# Patient Record
Sex: Female | Born: 1995 | Race: White | Hispanic: No | Marital: Single | Smoking: Never smoker
Health system: Southern US, Community
[De-identification: ages and names within clinical notes are randomized; demographics above are authoritative.]

## PROBLEM LIST (undated history)

## (undated) ENCOUNTER — Emergency Department (HOSPITAL_BASED_OUTPATIENT_CLINIC_OR_DEPARTMENT_OTHER): Admission: EM | Payer: BC Managed Care – PPO | Source: Home / Self Care

## (undated) DIAGNOSIS — L409 Psoriasis, unspecified: Secondary | ICD-10-CM

## (undated) DIAGNOSIS — T7840XA Allergy, unspecified, initial encounter: Secondary | ICD-10-CM

## (undated) DIAGNOSIS — R519 Headache, unspecified: Secondary | ICD-10-CM

## (undated) DIAGNOSIS — G43909 Migraine, unspecified, not intractable, without status migrainosus: Secondary | ICD-10-CM

## (undated) DIAGNOSIS — L4050 Arthropathic psoriasis, unspecified: Secondary | ICD-10-CM

## (undated) HISTORY — PX: TONSILLECTOMY AND ADENOIDECTOMY: SHX28

## (undated) HISTORY — DX: Arthropathic psoriasis, unspecified: L40.50

## (undated) HISTORY — DX: Allergy, unspecified, initial encounter: T78.40XA

## (undated) HISTORY — PX: OTHER SURGICAL HISTORY: SHX169

## (undated) HISTORY — DX: Psoriasis, unspecified: L40.9

## (undated) HISTORY — PX: TYMPANOSTOMY TUBE PLACEMENT: SHX32

## (undated) HISTORY — DX: Headache, unspecified: R51.9

---
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
## 2015-08-06 ENCOUNTER — Encounter: Payer: Self-pay | Admitting: Emergency Medicine

## 2015-08-06 ENCOUNTER — Emergency Department
Admission: EM | Admit: 2015-08-06 | Discharge: 2015-08-06 | Disposition: A | Discharge status: Home / Self Care | Payer: PRIVATE HEALTH INSURANCE | Source: Home / Self Care | Attending: Family Medicine | Admitting: Family Medicine

## 2015-08-06 DIAGNOSIS — J069 Acute upper respiratory infection, unspecified: Secondary | ICD-10-CM

## 2015-08-06 DIAGNOSIS — J040 Acute laryngitis: Secondary | ICD-10-CM

## 2015-08-06 LAB — POCT RAPID STREP A (OFFICE): Rapid Strep A Screen: NEGATIVE

## 2015-08-06 MED ORDER — BENZONATATE 100 MG PO CAPS: 100.0000 mg | ORAL_CAPSULE | Freq: Three times a day (TID) | ORAL | Status: DC

## 2015-08-06 NOTE — ED Notes (Signed)
Pt c/o sore throat x 3 days, productive cough, runny nose.

## 2015-08-06 NOTE — ED Provider Notes (Signed)
CSN: [id number]     Arrival date & time 08/06/15  1112 History   First MD Initiated Contact with Patient 08/06/15 1137     Chief Complaint  Patient presents with  . Sore Throat   (Consider location/radiation/quality/duration/timing/severity/associated sxs/prior Treatment) HPI  The pt is a 20yo female presenting to [facility] with c/o cough, congestion, sore throat, and rhinorrhea for 3 days.  Throat pain is mild in severity, worse with cough and swallowing.  Cough is mildly productive. She did take some sudafed this morning. Denies chest pain or SOB.  Her brother is also here to be seen for n/v/d. Pt reports mild nausea but denies vomiting or diarrhea. She believes she is nauseated from not eating this morning.  Denies fever or chills.  Past Medical History  Diagnosis Date  . Migraine   . Allergy    Past Surgical History  Procedure Laterality Date  . Tonsillectomy and adenoidectomy    . Tympanostomy tube placement     Family History  Problem Relation Age of Onset  . Migraines Mother   . Hypertension Father   . Cancer Maternal Grandfather     pancreatitis --> pancreatic CA   Social History  Substance Use Topics  . Smoking status: Never Smoker   . Smokeless tobacco: None  . Alcohol Use: No   OB History    No data available     Review of Systems  Constitutional: Negative for fever and chills.  HENT: Positive for congestion, ear pain ( bilateral), rhinorrhea, sneezing, sore throat and voice change. Negative for trouble swallowing.   Respiratory: Positive for cough. Negative for shortness of breath.   Cardiovascular: Negative for chest pain and palpitations.  Gastrointestinal: Negative for nausea, vomiting, abdominal pain and diarrhea.  Musculoskeletal: Negative for myalgias, back pain and arthralgias.  Skin: Negative for rash.    Allergies  Review of patient's allergies indicates no known allergies.  Home Medications   Prior to Admission medications   Medication Sig Start  Date End Date Taking? Authorizing Provider  drospirenone-ethinyl estradiol (YAZ,GIANVI,LORYNA) 3-0.02 MG tablet Take 1 tablet by mouth daily.   Yes Historical Provider, MD  benzonatate (TESSALON) 100 MG capsule Take 1-2 capsules (100-200 mg total) by mouth every 8 (eight) hours. 08/06/15   Junius FinnerErin O'Malley, PA-C   Meds Ordered and Administered this Visit  Medications - No data to display  BP 144/100 mmHg  Pulse 117  Temp(Src) 98.9 F (37.2 C) (Oral)  Ht 5' (1.524 m)  Wt 165 lb 8 oz (75.07 kg)  BMI 32.32 kg/m2  SpO2 96%  LMP 07/08/2015 No data found.   Physical Exam  Constitutional: She appears well-developed and well-nourished. No distress.  HENT:  Head: Normocephalic and atraumatic.  Right Ear: Tympanic membrane normal.  Left Ear: Tympanic membrane normal.  Nose: Rhinorrhea present.  Mouth/Throat: Uvula is midline and mucous membranes are normal. Posterior oropharyngeal erythema present.  Eyes: Conjunctivae are normal. No scleral icterus.  Neck: Normal range of motion. Neck supple.  Hoarse voice but no stridor  Cardiovascular: Normal rate, regular rhythm and normal heart sounds.   Pulmonary/Chest: Effort normal and breath sounds normal. No respiratory distress. She has no wheezes. She has no rales.  Abdominal: Soft. She exhibits no distension. There is no tenderness.  Musculoskeletal: Normal range of motion.  Lymphadenopathy:    She has cervical adenopathy.  Neurological: She is alert.  Skin: Skin is warm and dry. She is not diaphoretic.  Nursing note and vitals reviewed.   ED Course  Procedures (including critical care time)  Labs Review Labs Reviewed  POCT RAPID STREP A (OFFICE)    Imaging Review No results found.     MDM   1. Laryngitis   2. Acute upper respiratory infection    Pt c/o 3 days of URI symptoms. No evidence of bacterial infection at this time. Rapid strep: negative  Encouraged symptomatic treatment. Rx: tessalon  Advised pt to use  acetaminophen and ibuprofen as needed for fever and pain. Encouraged rest and fluids. F/u with PCP in 7-10 days if not improving, sooner if worsening. Pt verbalized understanding and agreement with tx plan.     Junius Finner, PA-C 08/06/15 1145

## 2015-08-06 NOTE — Discharge Instructions (Signed)

## 2015-08-07 ENCOUNTER — Telehealth: Payer: Self-pay | Admitting: *Deleted

## 2015-08-07 LAB — STREP A DNA PROBE: GASP: NOT DETECTED

## 2015-12-29 ENCOUNTER — Ambulatory Visit (INDEPENDENT_AMBULATORY_CARE_PROVIDER_SITE_OTHER): Payer: PRIVATE HEALTH INSURANCE | Admitting: Family Medicine

## 2015-12-29 ENCOUNTER — Encounter: Payer: Self-pay | Admitting: Family Medicine

## 2015-12-29 ENCOUNTER — Ambulatory Visit (INDEPENDENT_AMBULATORY_CARE_PROVIDER_SITE_OTHER): Payer: PRIVATE HEALTH INSURANCE

## 2015-12-29 DIAGNOSIS — M542 Cervicalgia: Secondary | ICD-10-CM

## 2015-12-29 DIAGNOSIS — M5416 Radiculopathy, lumbar region: Secondary | ICD-10-CM | POA: Insufficient documentation

## 2015-12-29 DIAGNOSIS — M4186 Other forms of scoliosis, lumbar region: Secondary | ICD-10-CM

## 2015-12-29 DIAGNOSIS — M5412 Radiculopathy, cervical region: Secondary | ICD-10-CM | POA: Insufficient documentation

## 2015-12-29 IMAGING — DX DG LUMBAR SPINE COMPLETE 4+V
5 series · 5 of 5 positions shown · non-contrast
Comparison: None.

CLINICAL DATA: Lumbago for 1 month

EXAM:
LUMBAR SPINE - COMPLETE 4+ VIEW

[l-spine ap]
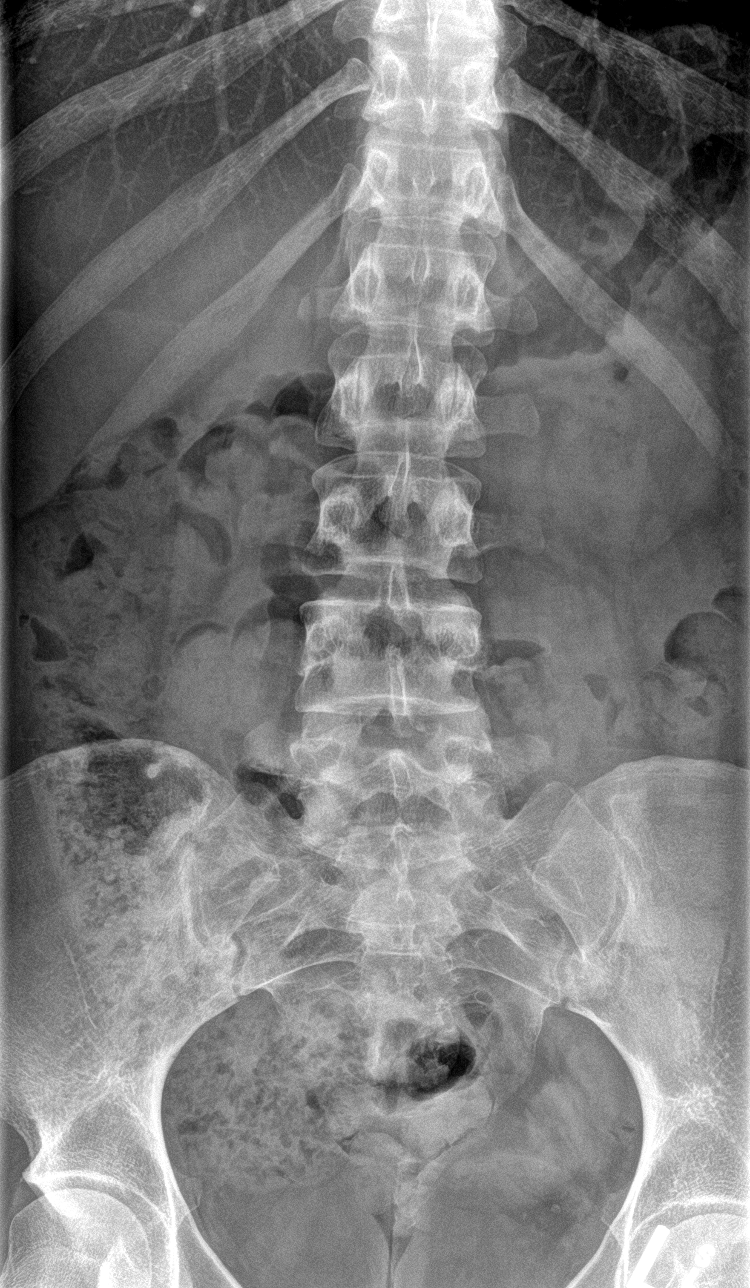

[l-spine obl (1 of 2)]
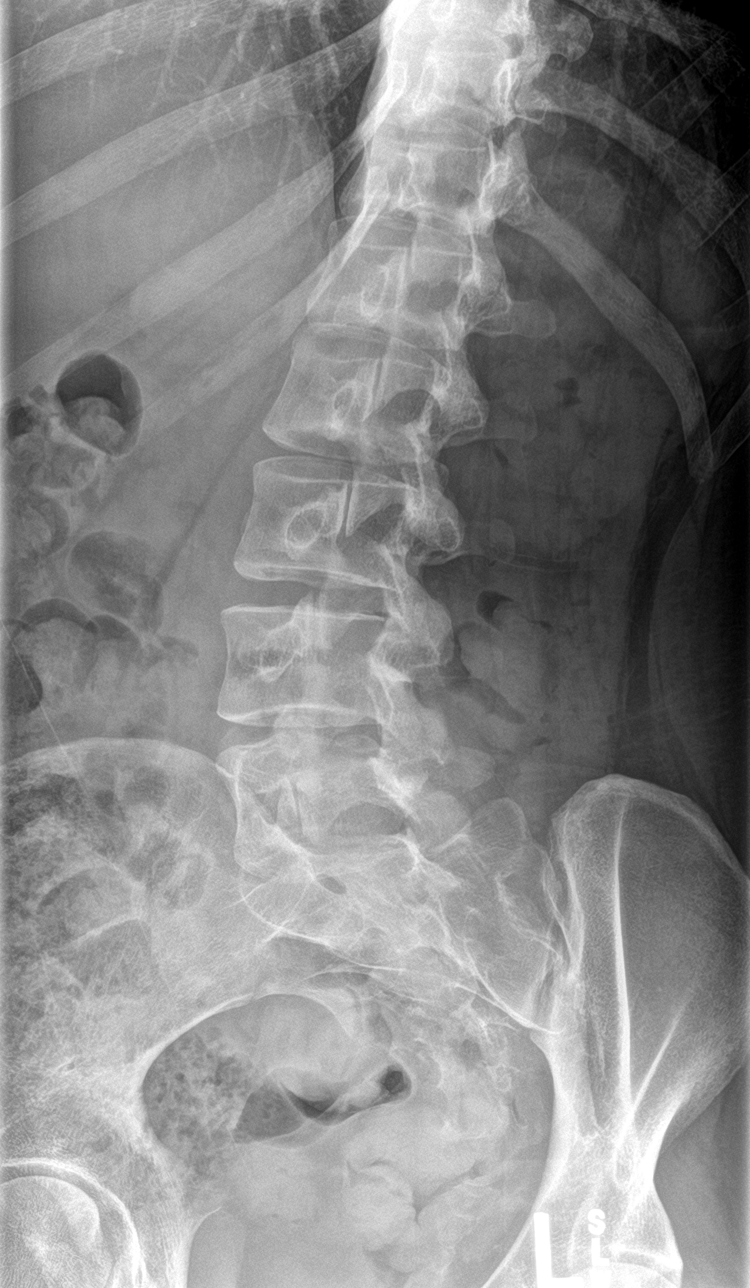

[l-spine obl (2 of 2)]
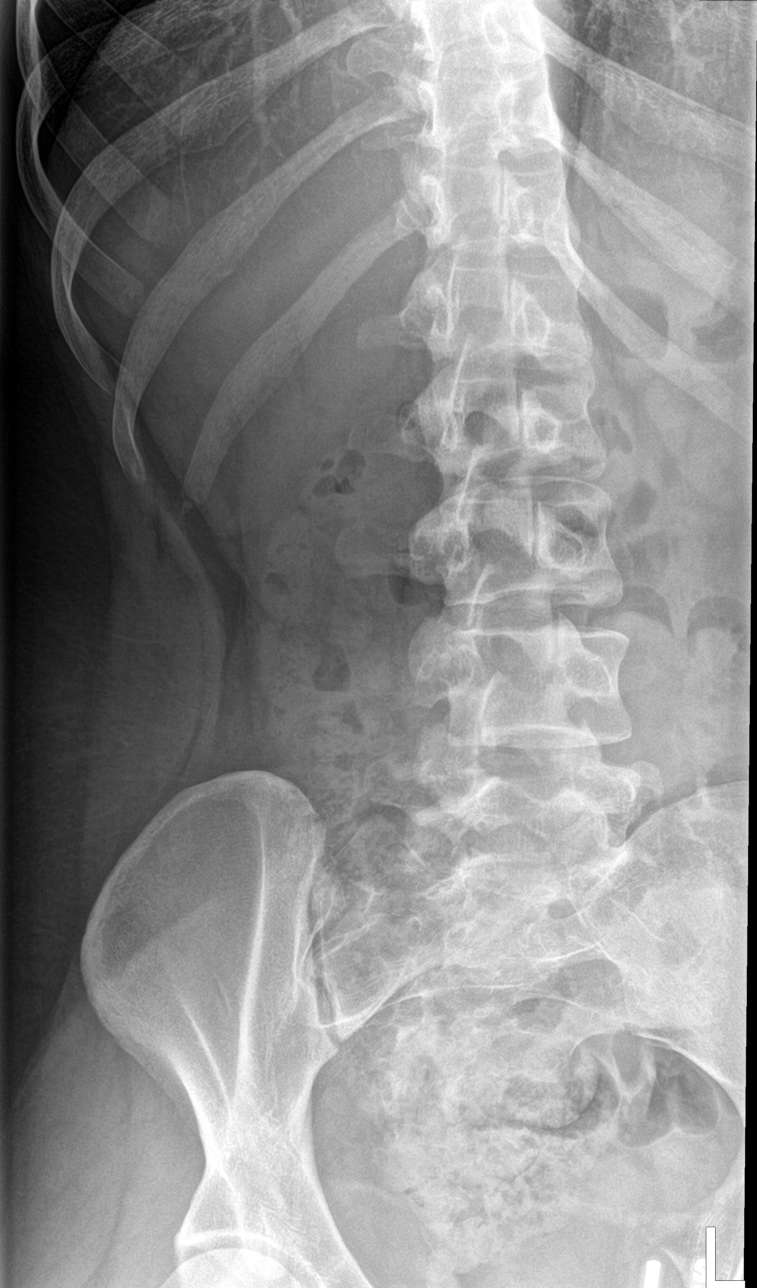

[l-spine lat]
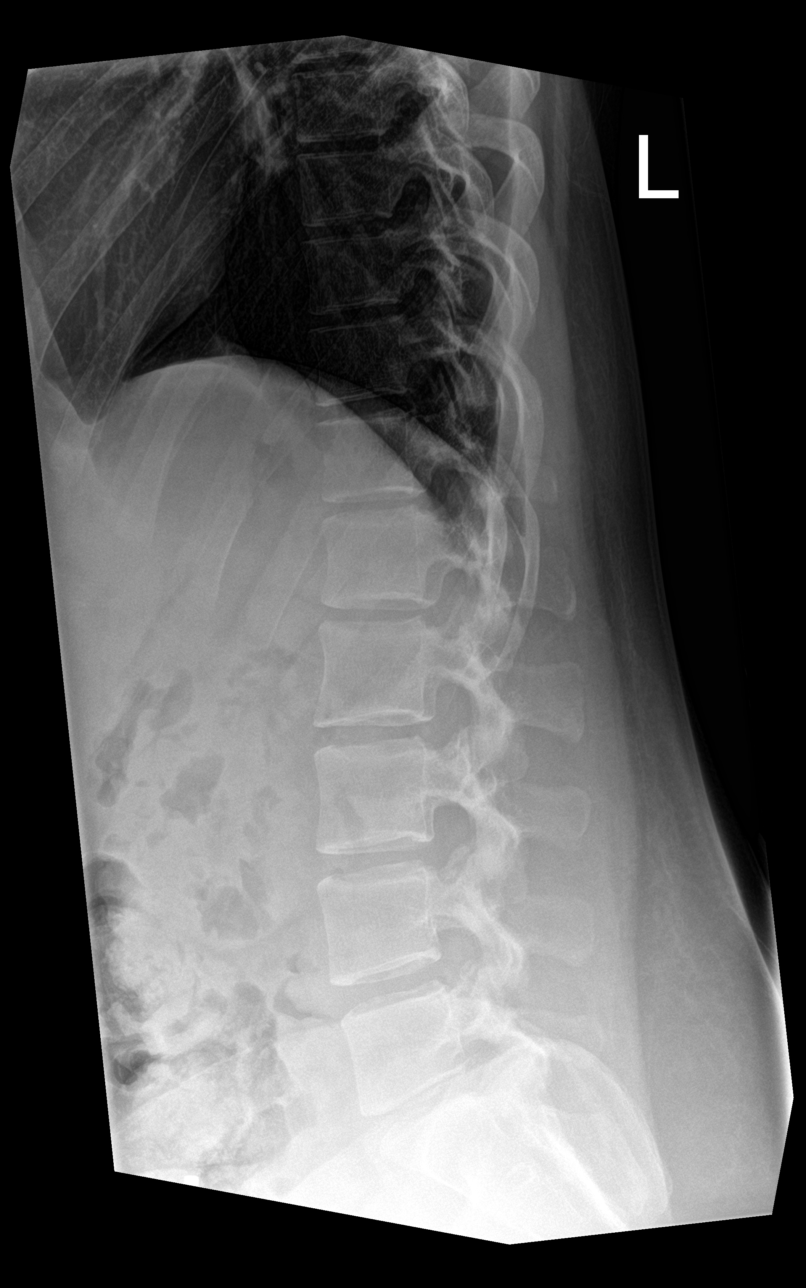

[l-spine spot]
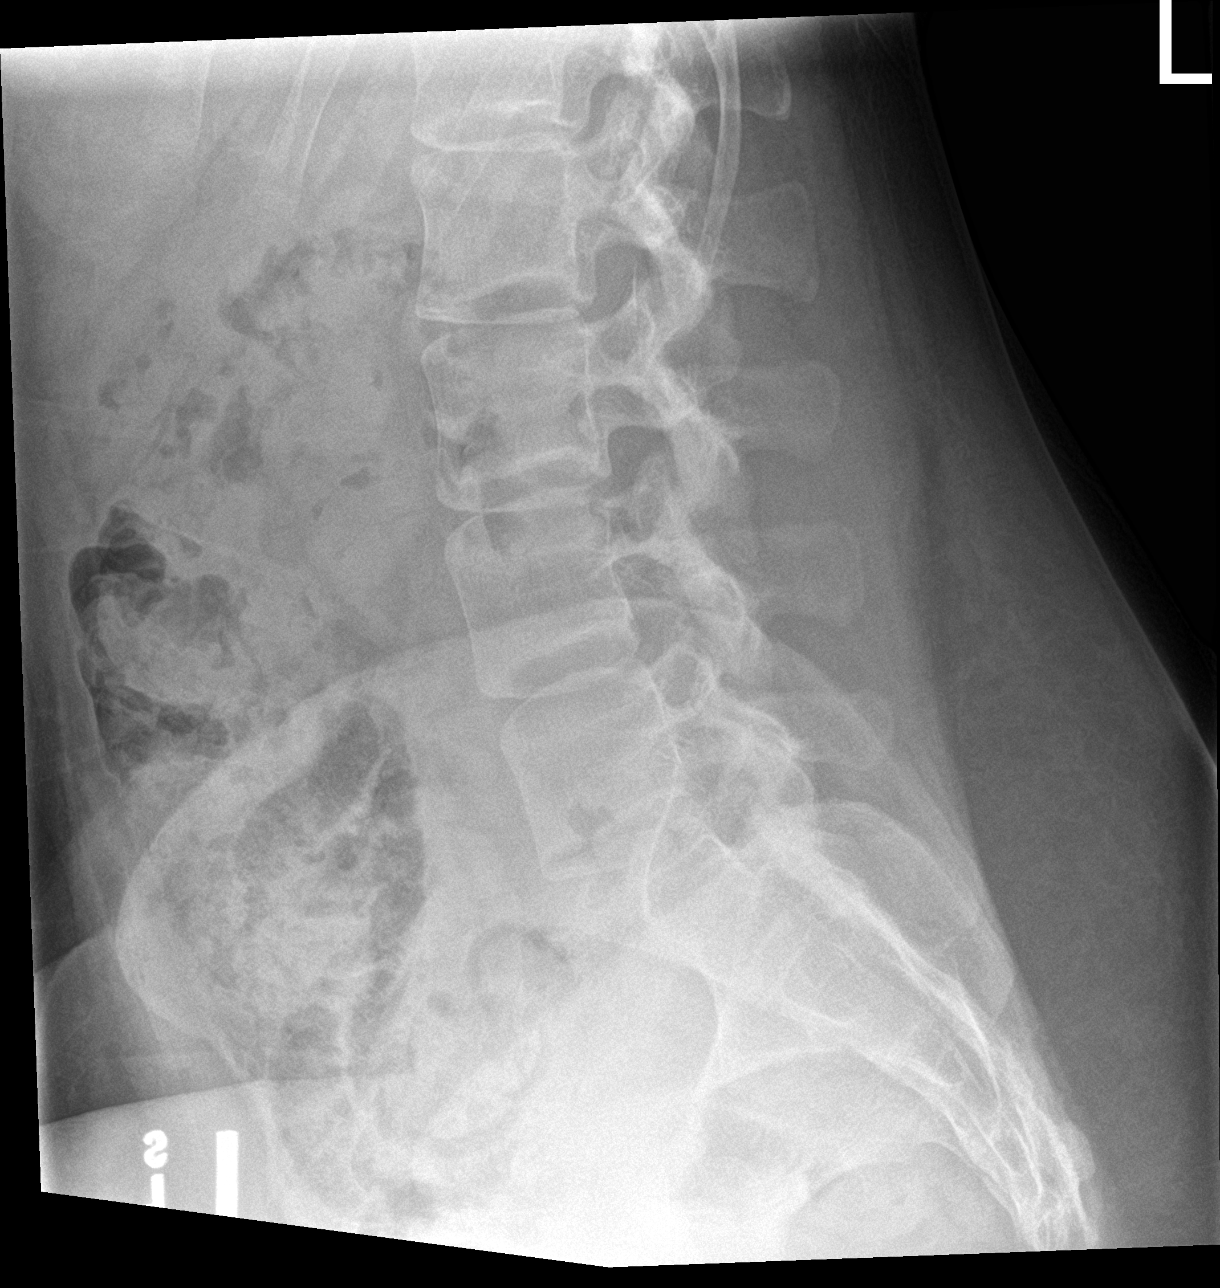

[5 of 5 positions shown; findings below may reference images not displayed]

FINDINGS: Frontal, lateral, spot lumbosacral lateral, and bilateral oblique
views were obtained. There are 5 non-rib-bearing lumbar type
vertebral bodies. There is slight lumbar dextroscoliosis. There is
no fracture or spondylolisthesis. The disc spaces appear normal.
There is no appreciable facet arthropathy.
IMPRESSION: Slight scoliosis. No fracture or spondylolisthesis. No appreciable
arthropathic change.

## 2015-12-29 IMAGING — DX DG CERVICAL SPINE COMPLETE 4+V
5 series · 5 of 5 positions shown · non-contrast
Comparison: None.

CLINICAL DATA: Right-sided cervicalgia for 1 month

EXAM:
CERVICAL SPINE - COMPLETE 4+ VIEW

[c-spine lat]
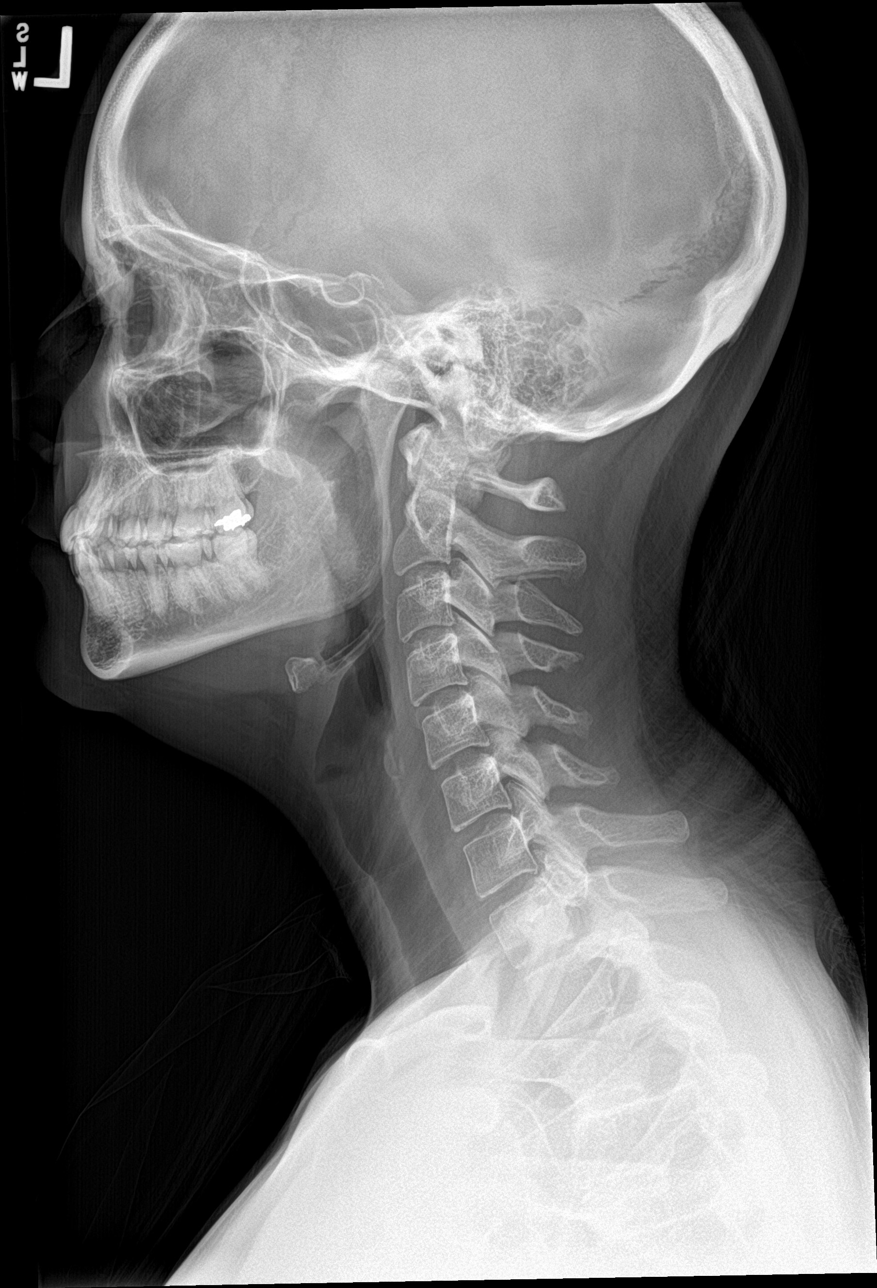

[c-spine obl (1 of 2)]
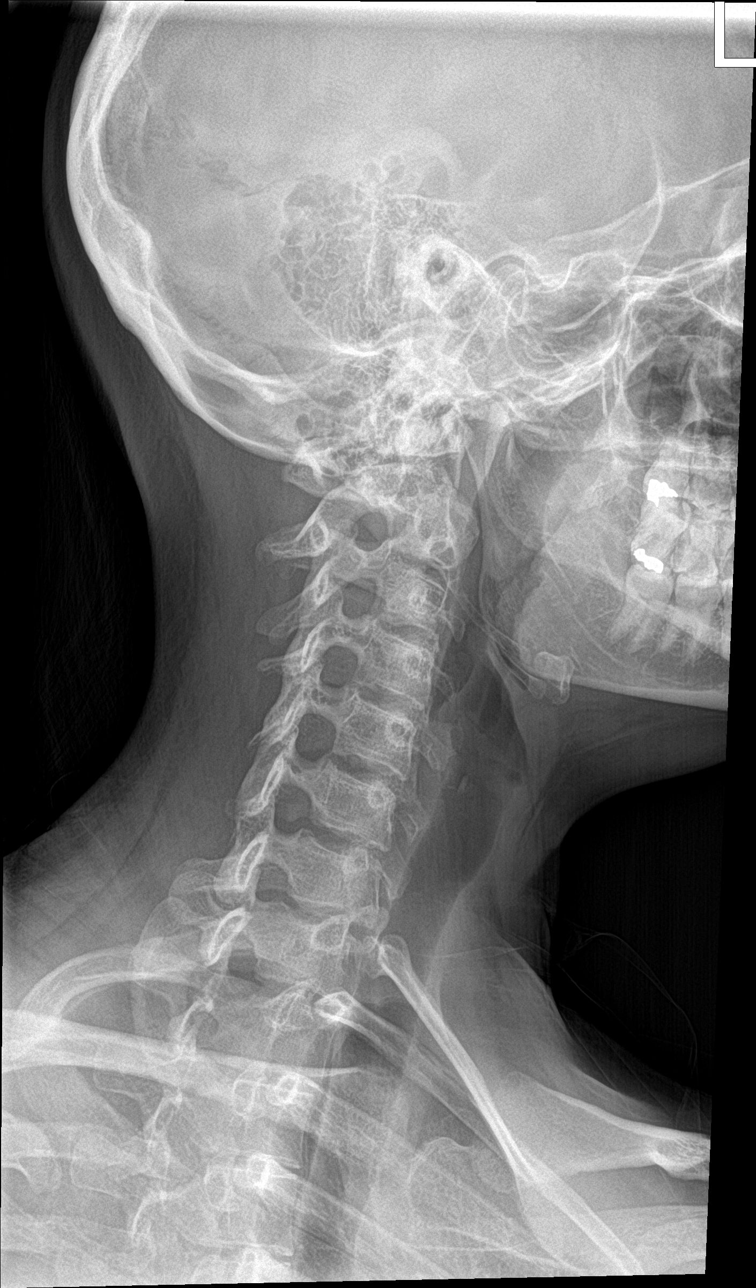

[c-spine obl (2 of 2)]
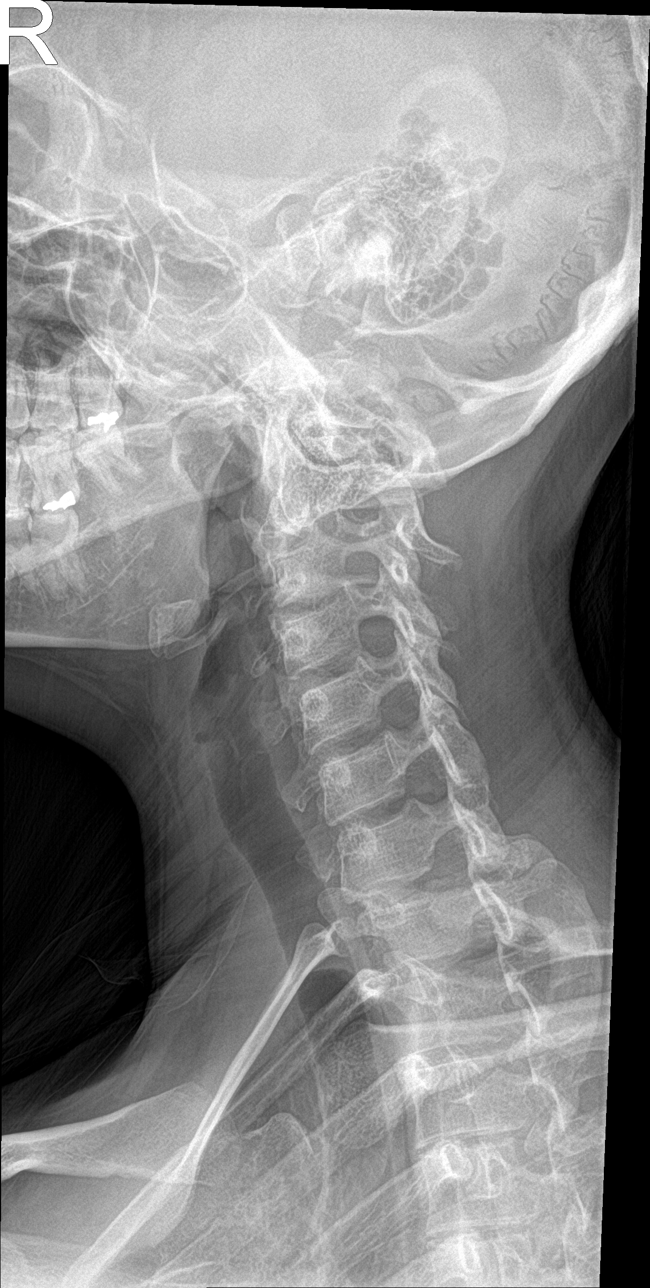

[c-spine ap]
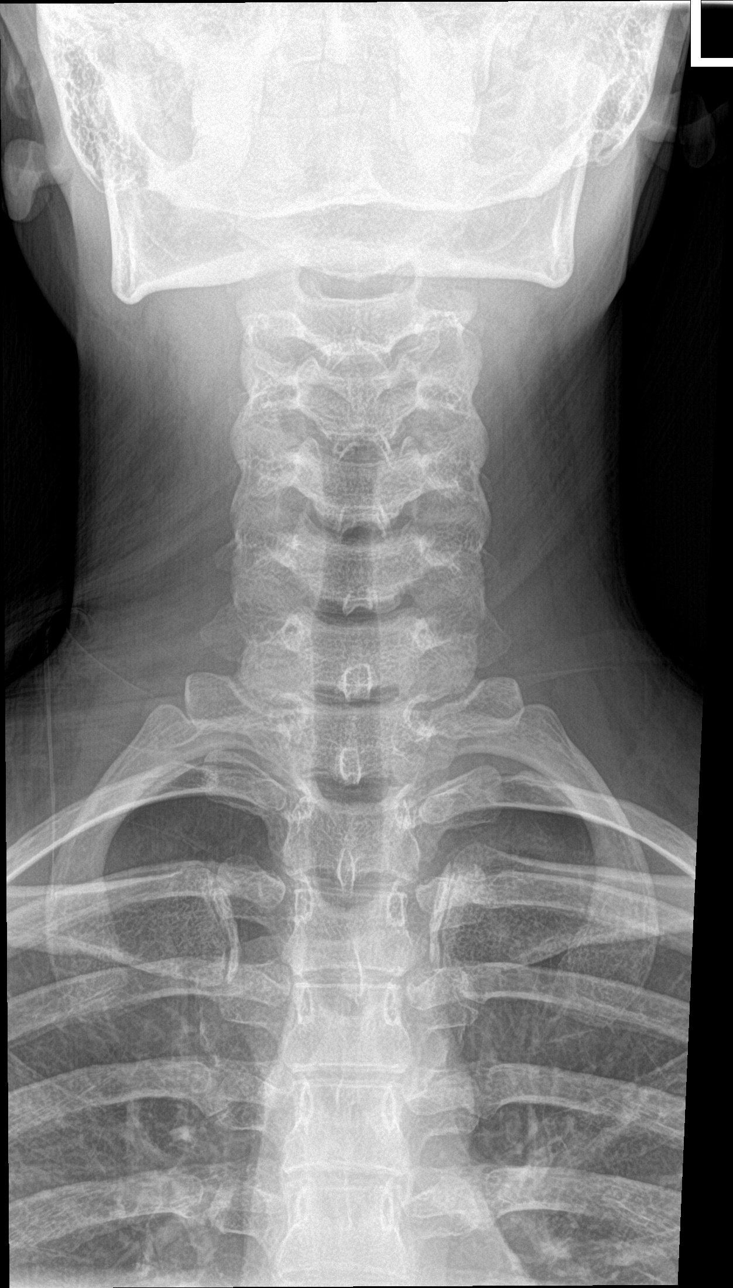

[c-spine open mouth]
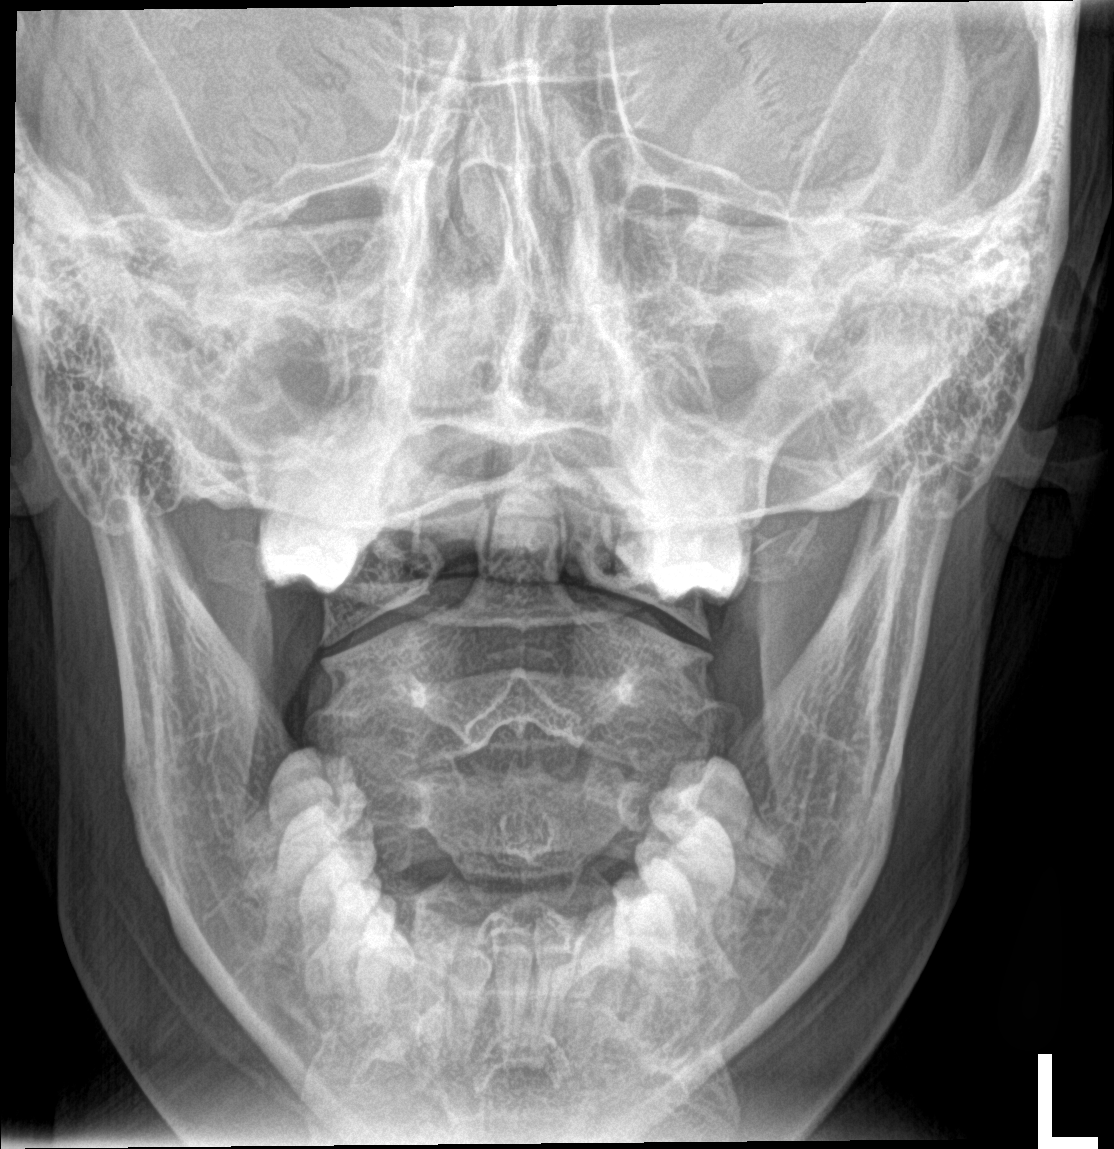

[5 of 5 positions shown; findings below may reference images not displayed]

FINDINGS: Frontal, lateral, open-mouth odontoid, and bilateral oblique views
were obtained. There is no fracture or spondylolisthesis.
Prevertebral soft tissues and predental space regions are normal.
The disc spaces appear normal. There is no appreciable exit
foraminal narrowing on the oblique views.
IMPRESSION: No fracture or spondylolisthesis.  No apparent arthropathy.

## 2015-12-29 MED ORDER — CYCLOBENZAPRINE HCL 5 MG PO TABS: 5.0000 mg | ORAL_TABLET | Freq: Every evening | ORAL | 1 refills | Status: DC | PRN

## 2015-12-29 NOTE — Patient Instructions (Signed)
Thank you for coming in today. Attend physical therapy Take 2 Aleve twice daily as needed for pain Use muscle relaxers at bedtime as needed Return in one month if not better Come back or go to the emergency room if you notice new weakness new numbness problems walking or bowel or bladder problems.   Spondylolisthesis With Rehab The slipping of one or multiple vertebrae out of the correct anatomical position is a condition known as spondylolisthesis. Spondylolisthesis is most common in adolescents and is caused by a number of different reasons, such as vertebral fracture or something you are born with (congenital). Spondylolisthesis is diagnosed with the use of X-rays. SYMPTOMS   Dull, achy pain in the lower back.  Pain that worsens with extension of the spine.  Tightness of the muscles on the back of the thigh.  Lower back stiffness.  Signs of nerve damage: pain, numbness, or weakness affecting one or both lower extremities.  Muscle wasting (atrophy), uncommon.  Loss of stool (bowel) or urine (bladder) function. CAUSES  The symptoms of spondylolisthesis are caused by one or more vertebrae that are out of alignment, placing pressure on the spinal cord. Common mechanisms of injury include:  Congenital defect of the spine.  Degenerative process.  Stress fracture of the spine.  Fracture due to trauma to the spine. RISK INCREASES WITH:  Activities that have a risk of hyperextending the back.  Activities that have a risk of excessively rotating the spine.  Poor strength and flexibility.  Failure to warm up properly before activity.  Family history of spondylolysis or spondylolisthesis.  Improper sports technique. PREVENTION  Warm up and stretch properly before activity.  Allow for adequate recovery between workouts.  Maintain physical fitness:  Strength, flexibility, and endurance.  Cardiovascular fitness.  Learn and use proper technique. When possible, have a  coach correct improper technique. PROGNOSIS  If treated properly, the spondylolisthesis usually resolves. RELATED COMPLICATIONS   Recurrent symptoms that result in a chronic problem.  Inability to compete in athletics.  Prolonged healing time, if improperly treated or reinjured.  Failure of the fracture to heal (nonunion).  Healing of the fracture in a poor position (malunion). TREATMENT Treatment initially involves resting from any activities that aggravate the symptoms and the use of ice and medications to help reduce pain and inflammation. The use of strengthening and stretching exercises may help reduce pain with activity. These exercises may be performed at home or with referral to a therapist. It is important to learn how to use proper body mechanics as to not place undue stress on your spine. If the injury is severe, then your caregiver may recommend a back brace to allow for healing, or even surgery. Surgery often involves fusing two adjacent vertebrae so no movement is allowed between them.  MEDICATION   If pain medication is necessary, then nonsteroidal anti-inflammatory medications, such as aspirin and ibuprofen, or other minor pain relievers, such as acetaminophen, are often recommended.  Do not take pain medication for 7 days before surgery.  Prescription pain relievers may be given if deemed necessary by your caregiver. Use only as directed and only as much as you need. HEAT AND COLD  Cold treatment (icing) relieves pain and reduces inflammation. Cold treatment should be applied for 10 to 15 minutes every 2 to 3 hours for inflammation and pain and immediately after any activity that aggravates your symptoms. Use ice packs or massage the area with a piece of ice (ice massage).  Heat treatment may be  used prior to performing the stretching and strengthening activities prescribed by your caregiver, physical therapist, or athletic trainer. Use a heat pack or soak the injury in  warm water. SEEK MEDICAL CARE IF:  Treatment seems to offer no benefit, or the condition worsens.  Any medications produce adverse side effects.  Any complications from surgery occur:  Pain, numbness, or coldness in the extremity operated upon.  Discoloration of the nail beds (they become blue or gray) of the extremity operated upon.  Signs of infections (fever, pain, inflammation, redness, or persistent bleeding). EXERCISES RANGE OF MOTION (ROM) AND STRETCHING EXERCISES - Spondylolisthesis Most people with low back pain will find that their symptoms worsen with either excessive bending forward (flexion) or arching at the low back (extension). The exercises which will help resolve your symptoms will focus on the opposite motion. Your physician, physical therapist or athletic trainer will help you determine which exercises will be most helpful to resolve your low back pain. Do not complete any exercises without first consulting with your clinician. Discontinue any exercises which worsen your symptoms until you speak to your clinician. If you have pain, numbness or tingling which travels down into your buttocks, leg, or foot, the goal of the therapy is for these symptoms to move closer to your back and eventually resolve. Occasionally, these leg symptoms will get better, but your low back pain may worsen; this is typically an indication of progress in your rehabilitation. Be certain to be very alert to any changes in your symptoms and the activities in which you participated in the 24 hours prior to the change. Sharing this information with your clinician will allow him/her to most efficiently treat your condition. These exercises may help you when beginning to rehabilitate your injury. Your symptoms may resolve with or without further involvement from your physician, physical therapist or athletic trainer. While completing these exercises, remember:   Restoring tissue flexibility helps normal  motion to return to the joints. This allows healthier, less painful movement and activity.  An effective stretch should be held for at least 30 seconds.  A stretch should never be painful. You should only feel a gentle lengthening or release in the stretched tissue. FLEXION RANGE OF MOTION AND STRETCHING EXERCISES: STRETCH - Flexion, Single Knee to Chest  Lie on a firm bed or floor with both legs extended in front of you.  Keeping one leg in contact with the floor, bring your opposite knee to your chest. Hold your leg in place by either grabbing behind your thigh or at your knee.  Pull until you feel a gentle stretch in your low back. Hold __________ seconds. Slowly release your grasp and repeat the exercise with the opposite side. Repeat __________ times. Complete this exercise __________ times per day.  STRETCH - Flexion, Double Knee to Chest  Lie on a firm bed or floor with both legs extended in front of you.  Keeping one leg in contact with the floor, bring your opposite knee to your chest.  Tense your stomach muscles to support your back and then lift your other knee to your chest. Hold your legs in place by either grabbing behind your thighs or at your knees.  Pull both knees toward your chest until you feel a gentle stretch in your low back. Hold __________ seconds.  Tense your stomach muscles and slowly return one leg at a time to the floor. Repeat __________ times. Complete this exercise __________ times per day.  STRENGTHENING EXERCISES - Spondylolisthesis  These exercises may help you when beginning to rehabilitate your injury. These exercises should be done near your "sweet spot." This is the neutral, low-back arch, somewhere between fully rounded and fully arched, that is your least painful position. When performed in this safe range of motion, these exercises can be used for people who have either a flexion or extension based injury. These exercises may resolve your symptoms  with or without further involvement from your physician, physical therapist or athletic trainer. While completing these exercises, remember:   Muscles can gain both the endurance and the strength needed for everyday activities through controlled exercises.  Complete these exercises as instructed by your physician, physical therapist or athletic trainer. Progress the resistance and repetitions only as guided.  You may experience muscle soreness or fatigue, but the pain or discomfort you are trying to eliminate should never worsen during these exercises. If this pain does worsen, stop and make certain you are following the directions exactly. If the pain is still present after adjustments, discontinue the exercise until you can discuss the trouble with your clinician. STRENGTHENING - Deep Abdominals, Pelvic Tilt   Lie on a firm bed or floor. Keeping your legs in front of you, bend your knees so they are both pointed toward the ceiling and your feet are flat on the floor.  Tense your lower abdominal muscles to press your low back into the floor. This motion will rotate your pelvis so that your tail bone is scooping upwards rather than pointing at your feet or into the floor.  With a gentle tension and even breathing, hold this position for __________ seconds. Repeat __________ times. Complete this exercise __________ times per day.  STRENGTHENING - Abdominals, Crunches   Lie on a firm bed or floor. Keeping your legs in front of you, bend your knees so they are both pointed toward the ceiling and your feet are flat on the floor. Cross your arms over your chest.  Slightly tip your chin down without bending your neck.  Tense your abdominals and slowly lift your trunk high enough to just clear your shoulder blades. Lifting higher can put excessive stress on the low back and does not further strengthen your abdominal muscles.  Control your return to the starting position. Repeat __________ times.  Complete this exercise __________ times per day.  STRENGTHENING - Quadruped, Opposite UE/LE Lift   Assume a hands and knees position on a firm surface. Keep your hands under your shoulders and your knees under your hips. You may place padding under your knees for comfort.  Find your neutral spine and gently tense your abdominal muscles so that you can maintain this position. Your shoulders and hips should form a rectangle that is parallel with the floor and is not twisted.  Keeping your trunk steady, lift your right hand no higher than your shoulder and then your left leg no higher than your hip. Make sure you are not holding your breath. Hold this position __________ seconds.  Continuing to keep your abdominal muscles tense and your back steady, slowly return to your starting position. Repeat with the opposite arm and leg. Repeat __________ times. Complete this exercise __________ times per day.  STRENGTHENING - Lower Abdominals, Double Knee Lift  Lie on a firm bed or floor. Keeping your legs in front of you, bend your knees so they are both pointed toward the ceiling and your feet are flat on the floor.  Tense your abdominal muscles to brace your low back  and slowly lift both of your knees until they come over your hips. Be certain not to hold your breath.  Hold __________ seconds. Using your abdominal muscles, return to the starting position in a slow and controlled manner. Repeat __________ times. Complete this exercise __________ times per day.  POSTURE AND BODY MECHANICS CONSIDERATIONS - Spondylolisthesis Keeping correct posture when sitting, standing or completing your activities will reduce the stress put on different body tissues, allowing injured tissues a chance to heal and limiting painful experiences. The following are general guidelines for improved posture. Your physician or physical therapist will provide you with any instructions specific to your needs. While reading these  guidelines, remember:  The exercises prescribed by your provider will help you have the flexibility and strength to maintain correct postures.  The correct posture provides the optimal environment for your joints to work. All of your joints have less wear and tear when properly supported by a spine with good posture. This means you will experience a healthier, less painful body.  Correct posture must be practiced with all of your activities, especially prolonged sitting and standing. Correct posture is as important when doing repetitive low-stress activities (typing) as it is when doing a single heavy-load activity (lifting). PROPER SITTING POSTURE In order to minimize stress and discomfort on your spine, you must sit with correct posture. Sitting with good posture should be effortless for a healthy body. Returning to good posture is a gradual process. Many people can work toward this most comfortably by using various supports until they have the flexibility and strength to maintain this posture on their own. When sitting with proper posture, your ears will fall over your shoulders and your shoulders will fall over your hips. You should use the back of the chair to support your upper back. Your low back will be in a neutral position, just slightly arched. You may place a small pillow or folded towel at the base of your low back for  support.  When working at a desk, create an environment that supports good, upright posture. Without extra support, muscles fatigue and lead to excessive strain on joints and other tissues. Keep these recommendations in mind: CHAIR:  A chair should be able to slide under your desk when your back makes contact with the back of the chair. This allows you to work closely.  The chair's height should allow your eyes to be level with the upper part of your monitor and your hands to be slightly lower than your elbows. BODY POSITION  Your feet should make contact with the  floor. If this is not possible, use a foot rest.  Keep your ears over your shoulders. This will reduce stress on your neck and low back. INCORRECT SITTING POSTURES If you are feeling tired and unable to assume a healthy sitting posture, do not slouch or slump. This puts excessive strain on your back tissues, causing more damage and pain. Healthier options include:  Using more support, like a lumbar pillow.  Switching tasks to something that requires you to be upright or walking.  Taking a brief walk.  Lying down to rest in a neutral-spine position.  CORRECT LIFTING TECHNIQUES DO:   Assume a wide stance. This will provide you more stability and the opportunity to get as close as possible to the object which you are lifting.  Tense your abdominals to brace your spine; then bend at the knees and hips. Keeping your back locked in a neutral-spine position, lift using  your leg muscles. Lift with your legs, keeping your back straight.  Test the weight of unknown objects before attempting to lift them.  Try to keep your elbows locked down at your sides in order get the best strength from your shoulders when carrying an object.  Always ask for help when lifting heavy or awkward objects. INCORRECT LIFTING TECHNIQUES DO NOT:   Lock your knees when lifting, even if it is a small object.  Bend and twist. Pivot at your feet or move your feet when needing to change directions.  Assume that you cannot safely pick up a paperclip without proper posture.   This information is not intended to replace advice given to you by your health care provider. Make sure you discuss any questions you have with your health care provider.   Document Released: 05/13/2005 Document Revised: 02/01/2015 Document Reviewed: 08/25/2008 Elsevier Interactive Patient Education [employer]

## 2015-12-29 NOTE — Progress Notes (Signed)
Subjective:    I'm seeing this patient as a consultation for:  Porfirio Oar, PA-C   CC: Back pain  HPI: Patient has back and neck pain present for a few months. Pain is been worsening recently. 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 She notes she lifted a heavy dog and felt immediate pulling sensation in her neck and back and pain radiating to the ulnar right hand as well as the bilateral lower extremities into the Lateral Feet. Since Then She's Had Continued Moderate Back and Neck Pain Worse at Times with Activity. She Gets Very Occasional pain radiating to the bilateral lower extremities with back extension. She denies any bowel or bladder dysfunction fevers or chills nausea vomiting or diarrhea. She has tried over-the-counter medicines for pain which have not helped very much.  Past medical history, Surgical history, Family history not pertinant except as noted below, Social history, Allergies, and medications have been entered into the medical record, reviewed, and no changes needed.   Review of Systems: No headache, visual changes, nausea, vomiting, diarrhea, constipation, dizziness, abdominal pain, skin rash, fevers, chills, night sweats, weight loss, swollen lymph nodes, body aches, joint swelling, muscle aches, chest pain, shortness of breath, mood changes, visual or auditory hallucinations.   Objective:    Vitals:   12/29/15 1003  BP: 131/87  Pulse: 83   General: Well Developed, well nourished, and in no acute distress.  Neuro/Psych: Alert and oriented x3, extra-ocular muscles intact, able to move all 4 extremities, sensation grossly intact. Skin: Warm and dry, no rashes noted.  Respiratory: Not using accessory muscles, speaking in full sentences, trachea midline.  Cardiovascular: Pulses palpable, no extremity edema. Abdomen: Does not appear distended. MSK:  Spine: Nontender to spinal midline. C-spine: Nontender normal motion. Positive right  side Spurling's test. Upper extremity strength is equal and normal throughout. Reflexes are intact and equal bilateral upper extremities. Sensation is intact throughout upper extremities.  L-spine: Nontender to spinal midline. Mildly tender bilateral lumbar paraspinal muscle group. Normal back motion however patient experiences pain with extension. Reflexes and strength are equal and normal bilateral lower extremities. Sensation is intact throughout. Positive right-sided slump test. Mildly positive bilateral straight leg raise test to the lateral calves (L5 nerve distribution). Normal gait.    No results found for this or any previous visit (from the past 24 hour(s)). Dg Cervical Spine Complete  Result Date: 12/29/2015 CLINICAL DATA:  Right-sided cervicalgia for 1 month EXAM: CERVICAL SPINE - COMPLETE 4+ VIEW COMPARISON:  None. FINDINGS: Frontal, lateral, open-mouth odontoid, and bilateral oblique views were obtained. There is no fracture or spondylolisthesis. Prevertebral soft tissues and predental space regions are normal. The disc spaces appear normal. There is no appreciable exit foraminal narrowing on the oblique views. IMPRESSION: No fracture or spondylolisthesis.  No apparent arthropathy. Electronically Signed   By: Bretta Bang III M.D.   On: 12/29/2015 10:57   Dg Lumbar Spine Complete  Result Date: 12/29/2015 CLINICAL DATA:  Lumbago for 1 month EXAM: LUMBAR SPINE - COMPLETE 4+ VIEW COMPARISON:  None. FINDINGS: Frontal, lateral, spot lumbosacral lateral, and bilateral oblique views were obtained. There are 5 non-rib-bearing lumbar type vertebral bodies. There is slight lumbar dextroscoliosis. There is no fracture or spondylolisthesis. The disc spaces appear normal. There is no appreciable facet arthropathy. IMPRESSION: Slight scoliosis. No fracture or spondylolisthesis. No appreciable arthropathic change. Electronically Signed   By: Bretta Bang III M.D.   On: 12/29/2015 10:58  Impression and Recommendations:    Assessment and Plan: 20 y.o. female with Back and neck pain. Patient has some cervical and lumbar radicular symptoms. Her lumbar radicular symptoms seem to be more persistent concerning. I'm worried about pars defects visible on x-ray today possibly causing dynamic spondylolisthesis with motion. Plan for trial physical therapy, NSAIDs, and muscle relaxers. If not better one month would recommend MRI of the L-spine to evaluate lumbar radicular symptoms as well as chronic back pain.   Discussed warning signs or symptoms. Please see discharge instructions. Patient expresses understanding.  CC: Porfirio Oar, PA-C

## 2016-05-23 ENCOUNTER — Encounter: Payer: Self-pay | Admitting: *Deleted

## 2016-05-23 ENCOUNTER — Emergency Department (INDEPENDENT_AMBULATORY_CARE_PROVIDER_SITE_OTHER)
Admission: EM | Admit: 2016-05-23 | Discharge: 2016-05-23 | Disposition: A | Discharge status: Home / Self Care | Payer: PRIVATE HEALTH INSURANCE | Source: Home / Self Care | Attending: Family Medicine | Admitting: Family Medicine

## 2016-05-23 DIAGNOSIS — L920 Granuloma annulare: Secondary | ICD-10-CM

## 2016-05-23 MED ORDER — TRIAMCINOLONE ACETONIDE 0.1 % EX CREA: 1.0000 "application " | TOPICAL_CREAM | Freq: Two times a day (BID) | CUTANEOUS | 0 refills | Status: DC

## 2016-05-23 NOTE — ED Triage Notes (Signed)
Pt c/o itching rash on her hands x 05/16/16. She reports seeing her PCP on 05/14/16 was given prednisone and eye gtts for allergies and pink eye.

## 2016-05-23 NOTE — ED Provider Notes (Signed)
[facility]    CSN: [id number] Arrival date & time: 05/23/16  0804     History   Chief Complaint Chief Complaint  Patient presents with  . Rash    HPI Ashlee Newton is a 20 y.o. female.   Patient reports that she developed itching around her eyes about 9 days ago and was prescribed an antibiotic eye suspension and oral prednisone.  Her eye symptoms subsequently resolved.  One week ago she developed a pruritic rash on the dorsa of her hands that tends to fade at night and recur some mornings (she showed an iPhone photo revealing hive-like raised annular erythematous lesions on the dorsa of her hands).  She states that she has recurring allergy symptoms and takes daily Xyzal.  She wonders if she has an allergy to cats, and requests a referral to an allergist.   The history is provided by the patient.  Rash  Location:  Hand Hand rash location:  Dorsum of L hand and dorsum of R hand Quality: dryness, itchiness, redness, swelling and weeping   Quality: not blistering, not bruising, not burning, not draining, not painful, not peeling and not scaling   Severity:  Mild Onset quality:  Sudden Duration:  1 week Timing:  Intermittent Progression:  Waxing and waning Chronicity:  New Context: not animal contact, not chemical exposure, not exposure to similar rash, not food, not hot tub use, not insect bite/sting, not medications, not new detergent/soap, not nuts, not plant contact, not pollen, not pregnancy, not sick contacts and not sun exposure   Relieved by:  Nothing Worsened by:  Nothing Ineffective treatments:  Antihistamines Associated symptoms: no abdominal pain, no diarrhea, no fatigue, no fever, no headaches, no induration, no joint pain, no myalgias, no nausea, no periorbital edema, no shortness of breath, no sore throat, no throat swelling, no tongue swelling, no URI and not wheezing     Past Medical History:  Diagnosis Date  . Allergy   . Migraine      Patient Active Problem List   Diagnosis Date Noted  . Cervical radiculitis 12/29/2015  . Lumbar radiculopathy 12/29/2015  . Seasonal allergies 07/04/2011  . Irregular menses 07/04/2011  . Headache(784.0) 07/04/2011    Past Surgical History:  Procedure Laterality Date  . TONSILLECTOMY AND ADENOIDECTOMY    . TYMPANOSTOMY TUBE PLACEMENT      OB History    No data available       Home Medications    Prior to Admission medications   Medication Sig Start Date End Date Taking? Authorizing Provider  drospirenone-ethinyl estradiol (YAZ,GIANVI,LORYNA) 3-0.02 MG tablet Take 1 tablet by mouth daily.    Historical Provider, MD  triamcinolone cream (KENALOG) 0.1 % Apply 1 application topically 2 (two) times daily. 05/23/16   Lattie HawStephen A Kahlan Engebretson, MD    Family History Family History  Problem Relation Age of Onset  . Migraines Mother   . Hypertension Father   . Cancer Maternal Grandfather     pancreatitis --> pancreatic CA    Social History Social History  Substance Use Topics  . Smoking status: Never Smoker  . Smokeless tobacco: Never Used  . Alcohol use No     Allergies   Patient has no known allergies.   Review of Systems Review of Systems  Constitutional: Negative for fatigue and fever.  HENT: Negative for sore throat.   Respiratory: Negative for shortness of breath and wheezing.   Gastrointestinal: Negative for abdominal pain, diarrhea and nausea.  Musculoskeletal: Negative  for arthralgias and myalgias.  Skin: Positive for rash.  Neurological: Negative for headaches.  All other systems reviewed and are negative.    Physical Exam Triage Vital Signs ED Triage Vitals  Enc Vitals Group     BP      Pulse      Resp      Temp      Temp src      SpO2      Weight      Height      Head Circumference      Peak Flow      Pain Score      Pain Loc      Pain Edu?      Excl. in GC?    No data found.   Updated Vital Signs BP 139/85 (BP Location: Left Arm)    Pulse 92   Temp 98.7 F (37.1 C) (Oral)   Resp 18   Ht 5\' 4"  (1.626 m)   Wt 181 lb (82.1 kg)   LMP 04/18/2016   SpO2 98%   BMI 31.07 kg/m   Visual Acuity Right Eye Distance:   Left Eye Distance:   Bilateral Distance:    Right Eye Near:   Left Eye Near:    Bilateral Near:     Physical Exam  Constitutional: She appears well-developed and well-nourished. No distress.  HENT:  Head: Normocephalic.  Nose: Nose normal.  Mouth/Throat: Oropharynx is clear and moist.  Eyes: Conjunctivae and EOM are normal. Pupils are equal, round, and reactive to light.  Neck: Normal range of motion.  Cardiovascular: Normal rate.   Pulmonary/Chest: Effort normal.  Musculoskeletal: She exhibits no edema.       Left hand: She exhibits no tenderness and no swelling.       Hands: Dorsa of both hands have scattered barely visible 1 to 2mm erythematous macules distributed near the MCP joints.  No swelling or tenderness to palpation.  Neurological: She is alert.  Skin: Skin is warm and dry.  Nursing note and vitals reviewed.    UC Treatments / Results  Labs (all labs ordered are listed, but only abnormal results are displayed) Labs Reviewed - No data to display  EKG  EKG Interpretation None       Radiology No results found.  Procedures Procedures (including critical care time)  Medications Ordered in UC Medications - No data to display   Initial Impression / Assessment and Plan / UC Course  I have reviewed the triage vital signs and the nursing notes.  Pertinent labs & imaging results that were available during my care of the patient were reviewed by me and considered in my medical decision making (see chart for details).  Clinical Course   Rx for triamcinolone 0.1% cream BID. Continue antihistamine as needed for itching. Recommend dermatology evaluation. Patient also requests referral to allergist for her recurring pruritus.     Final Clinical Impressions(s) / UC Diagnoses    Final diagnoses:  Granuloma annulare    New Prescriptions New Prescriptions   TRIAMCINOLONE CREAM (KENALOG) 0.1 %    Apply 1 application topically 2 (two) times daily.     Lattie HawStephen A Jadaya Sommerfield, MD 05/23/16 [phone]

## 2016-05-23 NOTE — Discharge Instructions (Signed)
Continue antihistamine as needed for itching.

## 2018-04-07 ENCOUNTER — Telehealth: Payer: Self-pay | Admitting: Family Medicine

## 2018-04-07 NOTE — Telephone Encounter (Signed)
Received documentation from [facility] of the [place].  Plan for typical allergy treatment with nasal steroid spray Xyzal Singulair documentation will be sent to scan.

## 2018-05-22 ENCOUNTER — Ambulatory Visit: Payer: BLUE CROSS/BLUE SHIELD | Admitting: Certified Nurse Midwife

## 2018-05-22 ENCOUNTER — Encounter: Payer: Self-pay | Admitting: Certified Nurse Midwife

## 2018-05-22 VITALS — BP 129/80 | HR 89 | Resp 16 | Ht 64.0 in | Wt 205.0 lb

## 2018-05-22 DIAGNOSIS — Z124 Encounter for screening for malignant neoplasm of cervix: Secondary | ICD-10-CM

## 2018-05-22 DIAGNOSIS — Z23 Encounter for immunization: Secondary | ICD-10-CM

## 2018-05-22 DIAGNOSIS — Z01419 Encounter for gynecological examination (general) (routine) without abnormal findings: Secondary | ICD-10-CM

## 2018-05-22 DIAGNOSIS — Z113 Encounter for screening for infections with a predominantly sexual mode of transmission: Secondary | ICD-10-CM | POA: Diagnosis not present

## 2018-05-22 DIAGNOSIS — Z3041 Encounter for surveillance of contraceptive pills: Secondary | ICD-10-CM

## 2018-05-22 MED ORDER — DROSPIRENONE-ETHINYL ESTRADIOL 3-0.02 MG PO TABS
1.0000 | ORAL_TABLET | Freq: Every day | ORAL | 1 refills | Status: DC
Start: 2018-05-22 — End: 2018-06-23

## 2018-05-22 NOTE — Progress Notes (Signed)
Gynecology Annual Exam   History of Present Illness: Patient is a 22 y.o. G0P0000 presents for a GYN exam. The patient has no complaints today. The patient is sexually active. Reports 3 new partners in the last year. She uses condoms most times. Currently using YAZ but would like to switch d/t weight gain. She denies dyspareunia. The patient does not perform self breast exams. There is no notable family history of breast or ovarian cancer in her family. The patient denies current symptoms of depression. She would like a full STD screen.  Past Medical History:  Past Medical History:  Diagnosis Date  . Allergies   . Allergy   . Migraine     Past Surgical History:  Past Surgical History:  Procedure Laterality Date  . TONSILLECTOMY AND ADENOIDECTOMY    . TYMPANOSTOMY TUBE PLACEMENT      Gynecologic History:  LMP: Patient's last menstrual period was 04/24/2018. Average Interval: regular Heavy Menses: no Dysmenorrhea: no Contraception: OCP (estrogen/progesterone) Last Pap: unsure, may have been done last year.   Obstetric History: G0P0000  Family History:  Family History  Problem Relation Age of Onset  . Migraines Mother   . Hypercholesterolemia Mother   . Hypertension Father   . Cancer Maternal Grandfather        pancreatitis --> pancreatic CA    Social History:  Social History   Socioeconomic History  . Marital status: Single    Spouse name: Not on file  . Number of children: Not on file  . Years of education: Not on file  . Highest education level: Not on file  Occupational History  . 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 Financial resource strain: Not on file  . Food insecurity:    Worry: Not on file    Inability: Not on file  . Transportation needs:    Medical: Not on file    Non-medical: Not on file  Tobacco Use  . Smoking status: Never Smoker  . Smokeless tobacco: Never Used  Substance and Sexual Activity  . Alcohol use: No  . Drug use: No  . Sexual  activity: Yes    Partners: Male    Birth control/protection: Pill  Lifestyle  . Physical activity:    Days per week: Not on file    Minutes per session: Not on file  . Stress: Not on file  Relationships  . Social connections:    Talks on phone: Not on file    Gets together: Not on file    Attends religious service: Not on file    Active member of club or organization: Not on file    Attends meetings of clubs or organizations: Not on file    Relationship status: Not on file  . Intimate partner violence:    Fear of current or ex partner: Not on file    Emotionally abused: Not on file    Physically abused: Not on file    Forced sexual activity: Not on file  Other Topics Concern  . Not on file  Social History Narrative  . Not on file    Allergies:  No Known Allergies  Medications: Prior to Admission medications   Medication Sig Start Date End Date Taking? Authorizing Provider  drospirenone-ethinyl estradiol (YAZ,GIANVI,LORYNA) 3-0.02 MG tablet Take 1 tablet by mouth daily.   Yes [provider]  levocetirizine (XYZAL) 5 MG tablet Take by mouth.   Yes [provider]  montelukast (SINGULAIR) 10 MG tablet Take by mouth.  Yes [provider]  drospirenone-ethinyl estradiol (YAZ,GIANVI,LORYNA) 3-0.02 MG tablet Take 1 tablet by mouth daily. 05/22/18   Donette LarryBhambri, Mark Hassey, CNM  triamcinolone cream (KENALOG) 0.1 % Apply 1 application topically 2 (two) times daily. Patient not taking: Reported on 05/22/2018 05/23/16   Lattie HawBeese, Stephen A, MD    Review of Systems: negative except noted in HPI No pelvic pain No vaginal discharge No dyspareunia  Physical Exam Vitals: Blood pressure 129/80, pulse 89, resp. rate 16, height 5\' 4"  (1.626 m), weight 93 kg, last menstrual period 04/24/2018. General: NAD HEENT: normocephalic, atraumatic Thyroid: no enlargement, no palpable nodules Pulmonary: Normal rate and effort, CTAB Cardiovascular: RRR Breast: Breast  symmetrical, no tenderness, no palpable nodules or masses, no skin or nipple retraction present, no nipple discharge. No axillary or supraclavicular lymphadenopathy. Abdomen: soft, non-tender, non-distended. No hepatomegaly, splenomegaly or masses palpable. No evidence of hernia  Genitourinary:  External: Normal external female genitalia. Normal urethral meatus  Vagina: Normal vaginal mucosa, no evidence of prolapse   Cervix: Grossly normal in appearance, no bleeding  Uterus: Non-enlarged, mobile, normal contour. No CMT  Adnexa: non-enlarged, no masses  Rectal: deferred Extremities: no edema, erythema, or tenderness Neurologic: Grossly intact Psychiatric: mood appropriate, affect full  Female chaperone present for pelvic and breast portions of the physical exam  Assessment:  1. Screening for cervical cancer   2. Immunization due   3. Routine screening for STI (sexually transmitted infection)   4. Encounter for surveillance of contraceptive pills    Plan: Discussed all forms of contraception including SE profile She may be interested in IUD or Nexplanon- brochures given Stressed the use of condoms every encounter Follow [facility]YN [facility] Gardas[facility]   Donette LarryBhambri, Rayburn Mundis, CNM 05/22/2018 1:58 PM

## 2018-05-25 LAB — HIV ANTIBODY (ROUTINE TESTING W REFLEX): HIV 1&2 Ab, 4th Generation: NONREACTIVE

## 2018-05-25 LAB — HEPATITIS C ANTIBODY
Hepatitis C Ab: NONREACTIVE
SIGNAL TO CUT-OFF: 0.01 (ref <1.00)

## 2018-05-25 LAB — HEPATITIS B SURFACE ANTIGEN: HEP B S AG: NONREACTIVE

## 2018-05-25 LAB — RPR: RPR Ser Ql: NONREACTIVE

## 2018-05-26 LAB — CYTOLOGY - PAP
Chlamydia: NEGATIVE
Diagnosis: NEGATIVE
Neisseria Gonorrhea: NEGATIVE
TRICH (WINDOWPATH): NEGATIVE

## 2018-06-13 ENCOUNTER — Other Ambulatory Visit: Payer: Self-pay | Admitting: Certified Nurse Midwife

## 2018-06-23 ENCOUNTER — Other Ambulatory Visit: Payer: Self-pay | Admitting: *Deleted

## 2018-06-23 MED ORDER — DROSPIRENONE-ETHINYL ESTRADIOL 3-0.02 MG PO TABS: 1.0000 | ORAL_TABLET | Freq: Every day | ORAL | 11 refills | Status: DC

## 2018-07-09 ENCOUNTER — Ambulatory Visit (INDEPENDENT_AMBULATORY_CARE_PROVIDER_SITE_OTHER): Payer: BLUE CROSS/BLUE SHIELD

## 2018-07-09 ENCOUNTER — Encounter: Payer: Self-pay | Admitting: Family Medicine

## 2018-07-09 ENCOUNTER — Ambulatory Visit (INDEPENDENT_AMBULATORY_CARE_PROVIDER_SITE_OTHER): Payer: BLUE CROSS/BLUE SHIELD | Admitting: Family Medicine

## 2018-07-09 VITALS — BP 145/84 | HR 88 | Ht 64.0 in | Wt 209.0 lb

## 2018-07-09 DIAGNOSIS — H9193 Unspecified hearing loss, bilateral: Secondary | ICD-10-CM | POA: Diagnosis not present

## 2018-07-09 DIAGNOSIS — M25561 Pain in right knee: Secondary | ICD-10-CM | POA: Diagnosis not present

## 2018-07-09 DIAGNOSIS — R51 Headache: Secondary | ICD-10-CM

## 2018-07-09 DIAGNOSIS — M545 Low back pain, unspecified: Secondary | ICD-10-CM

## 2018-07-09 DIAGNOSIS — R519 Headache, unspecified: Secondary | ICD-10-CM

## 2018-07-09 DIAGNOSIS — J029 Acute pharyngitis, unspecified: Secondary | ICD-10-CM

## 2018-07-09 DIAGNOSIS — M222X1 Patellofemoral disorders, right knee: Secondary | ICD-10-CM

## 2018-07-09 DIAGNOSIS — G8929 Other chronic pain: Secondary | ICD-10-CM

## 2018-07-09 IMAGING — DX DG KNEE COMPLETE 4+V*R*
4 series · 4 of 4 positions shown · non-contrast
Comparison: Right knee series [DATE].

CLINICAL DATA: 22-year-old female with right knee pain for 2 weeks.
No known injury.

EXAM:
RIGHT KNEE - COMPLETE 4+ VIEW; LEFT KNEE - 1-2 VIEW

[tunnel]
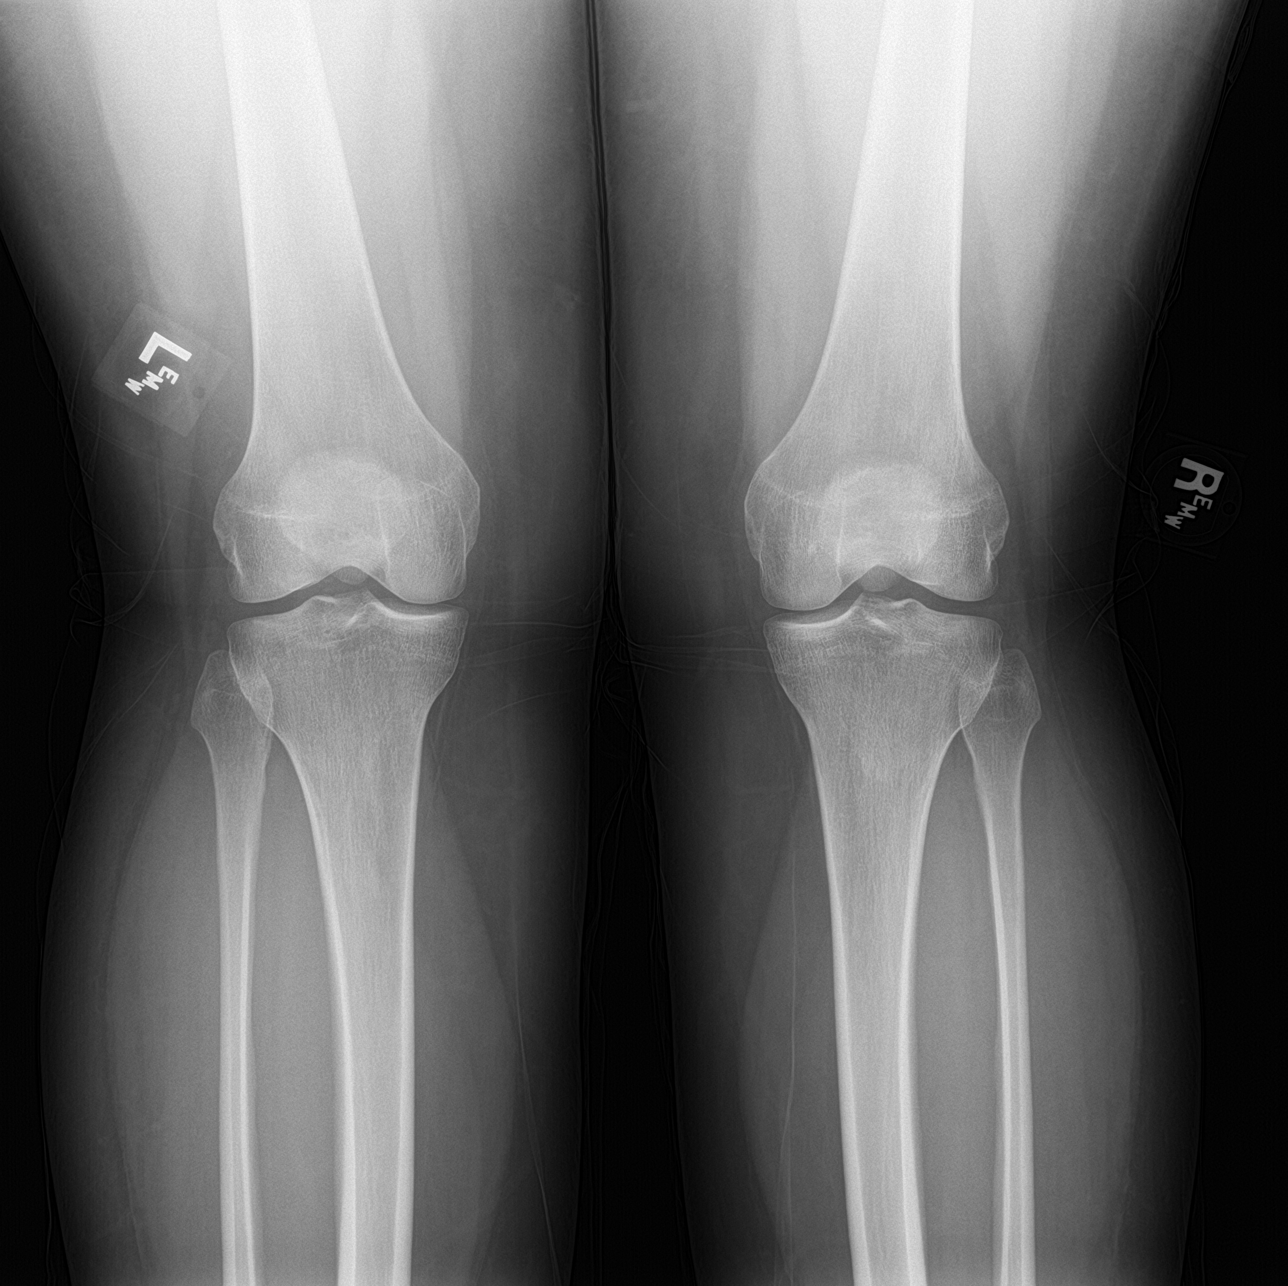

[knee lat]
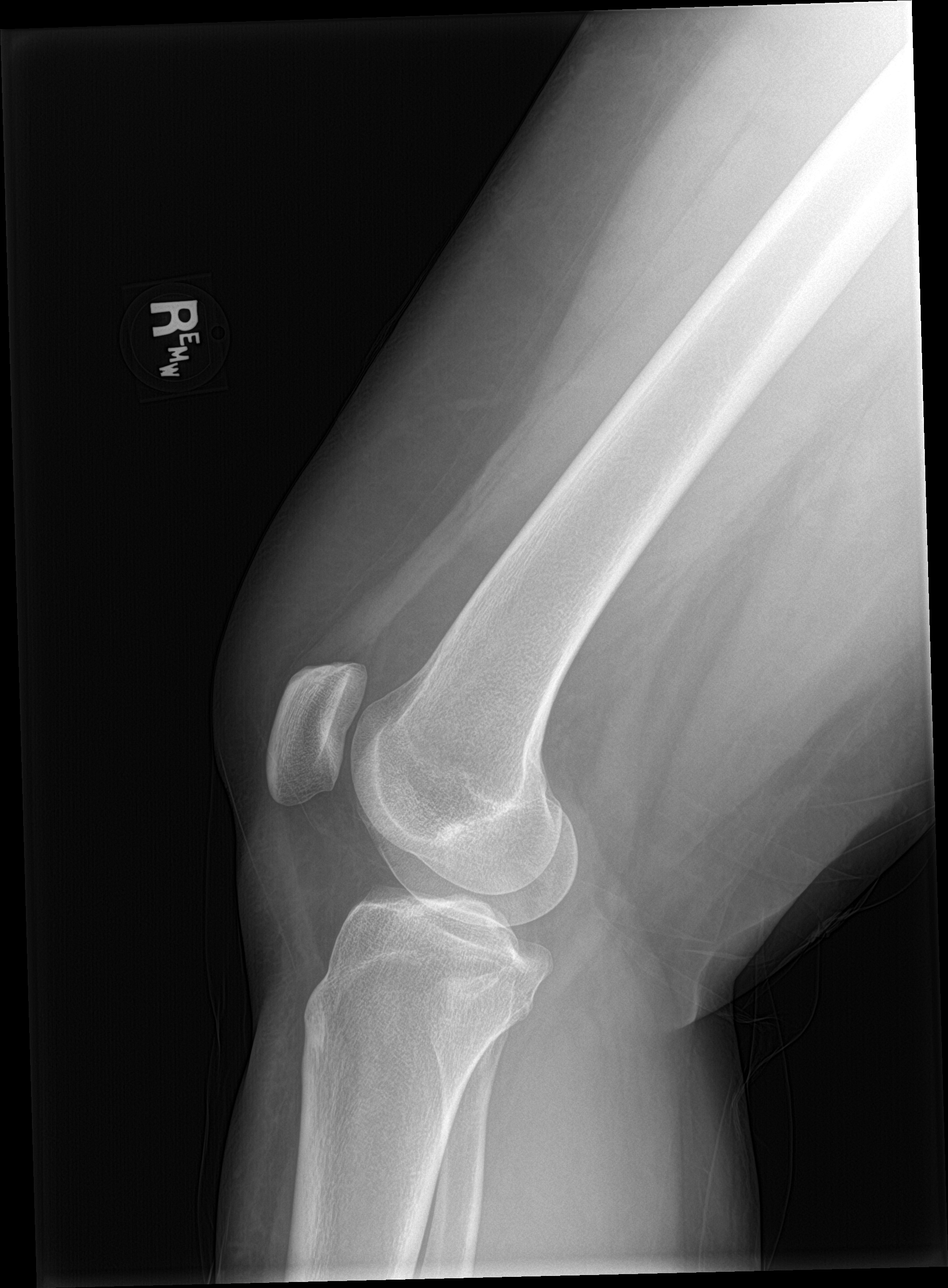

[knee sunrise]
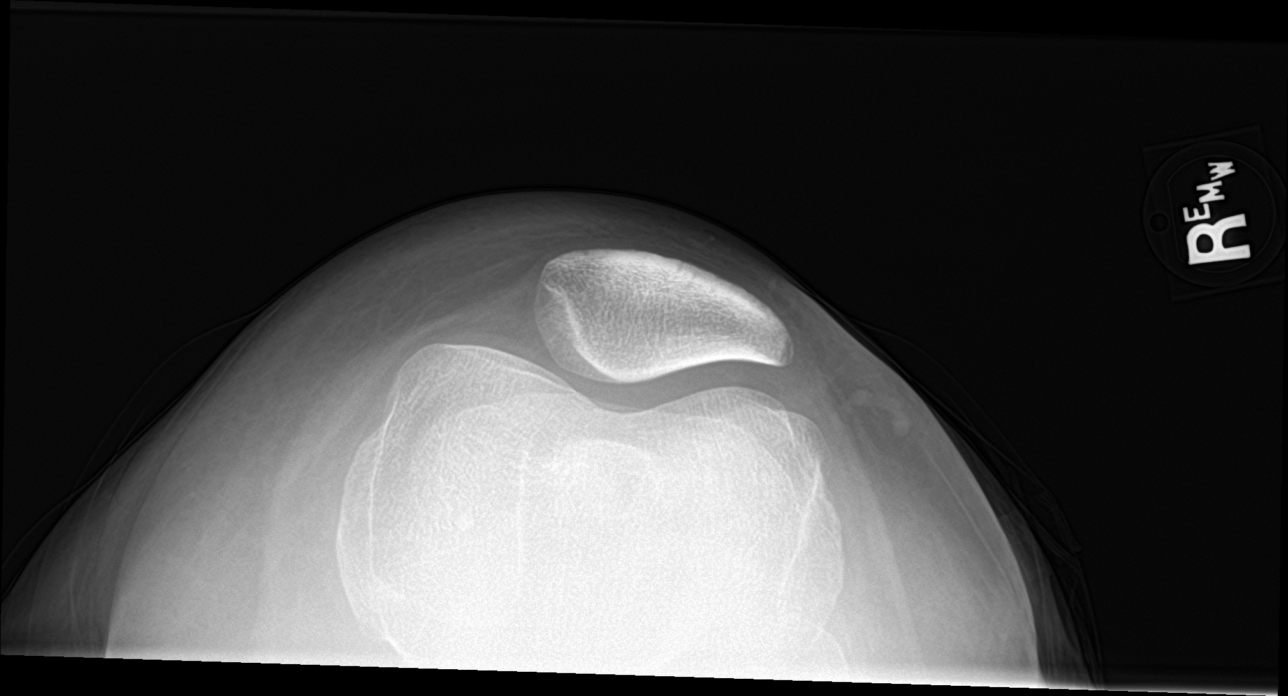

[knee ap bilat standing]
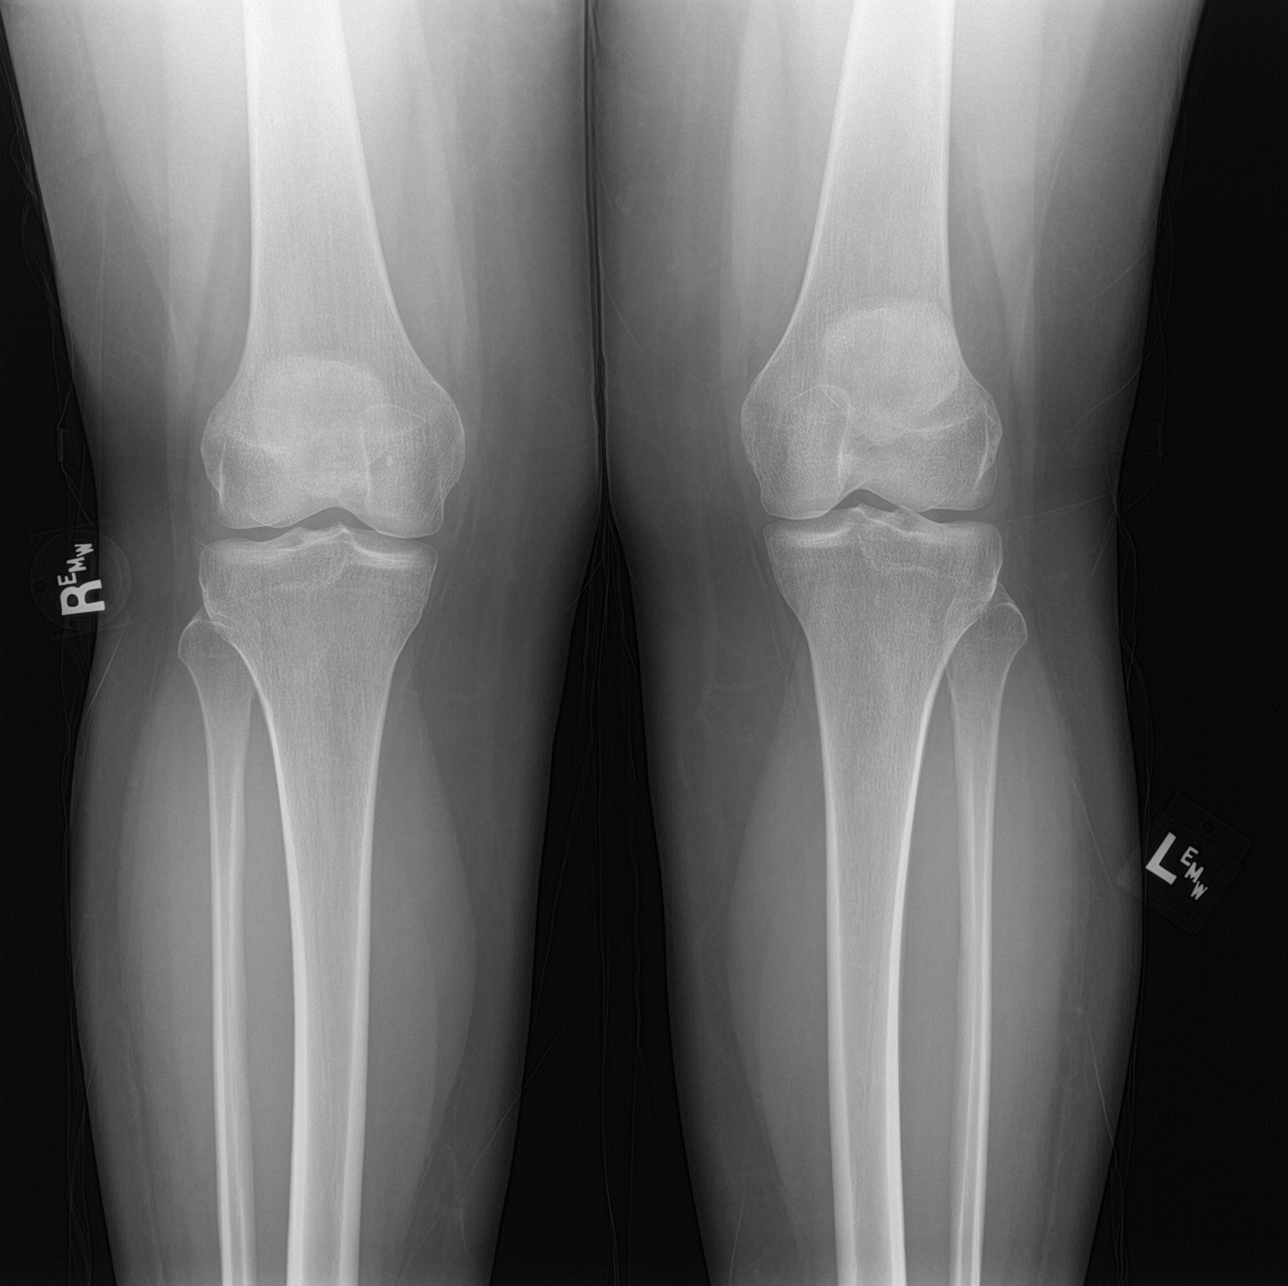

[4 of 4 positions shown; findings below may reference images not displayed]

FINDINGS: AP and tunnel views of the bilateral knees, weightbearing. There is
minimal to mild bilateral medial compartment joint space loss. Other
joint spaces are preserved. Bone mineralization is within normal
limits. No evidence of fracture, dislocation, or joint effusion.
IMPRESSION: Minimal to mild bilateral knee medial compartment joint space loss.
No right joint effusion or acute osseous abnormality identified.

## 2018-07-09 MED ORDER — SUMATRIPTAN SUCCINATE 50 MG PO TABS: 50.0000 mg | ORAL_TABLET | ORAL | 0 refills | Status: DC | PRN

## 2018-07-09 MED ORDER — OMEPRAZOLE 40 MG PO CPDR: 40.0000 mg | DELAYED_RELEASE_CAPSULE | Freq: Every day | ORAL | 3 refills | Status: DC

## 2018-07-09 MED ORDER — DICLOFENAC SODIUM 1 % TD GEL: 4.0000 g | Freq: Four times a day (QID) | TRANSDERMAL | 11 refills | Status: DC

## 2018-07-09 MED ORDER — IPRATROPIUM BROMIDE 0.06 % NA SOLN: 2.0000 | NASAL | 6 refills | Status: DC | PRN

## 2018-07-09 NOTE — Patient Instructions (Addendum)
Thank you for coming in today.  Use the diclofenac gel on the knee as needed.  Attend PT for the knee and back.   Use imitrex as needed for headache.   You should hear from Audiology.   Recheck with me in 1 month.   Return sooner if needed.     Patellofemoral Pain Syndrome  Patellofemoral pain syndrome is a condition in which the tissue (cartilage) on the underside of the kneecap (patella) softens or breaks down. This causes pain in the front of the knee. The condition is also called runner's knee or chondromalacia patella. Patellofemoral pain syndrome is most common in young adults who are active in sports. The knee is the largest joint in the body. The patella covers the front of the knee and is attached to muscles above and below the knee. The underside of the patella is covered with a smooth type of cartilage (synovium). The smooth surface helps the patella to glide easily when you move your knee. Patellofemoral pain syndrome causes swelling in the joint linings and bone surfaces in the knee. What are the causes? This condition may be caused by:  Overuse of the knee.  Poor alignment of your knee joints.  Weak leg muscles.  A direct blow to your kneecap. What increases the risk? You are more likely to develop this condition if:  You do a lot of activities that can wear down your kneecap. These include: ? Running. ? Squatting. ? Climbing stairs.  You start a new physical activity or exercise program.  You wear shoes that do not fit well.  You do not have good leg strength.  You are overweight. What are the signs or symptoms? The main symptom of this condition is knee pain. This may feel like a dull, aching pain underneath your patella, in the front of your knee. There may be a popping or cracking sound when you move your knee. Pain may get worse with:  Exercise.  Climbing stairs.  Running.  Jumping.  Squatting.  Kneeling.  Sitting for a long  time.  Moving or pushing on your patella. How is this diagnosed? This condition may be diagnosed based on:  Your symptoms and medical history. You may be asked about your recent physical activities and which ones cause knee pain.  A physical exam. This may include: ? Moving your patella back and forth. ? Checking your range of knee motion. ? Having you squat or jump to see if you have pain. ? Checking the strength of your leg muscles.  Imaging tests to confirm the diagnosis. These may include an MRI of your knee. How is this treated? This condition may be treated at home with rest, ice, compression, and elevation (RICE).  Other treatments may include:  Nonsteroidal anti-inflammatory drugs (NSAIDs).  Physical therapy to stretch and strengthen your leg muscles.  Shoe inserts (orthotics) to take stress off your knee.  A knee brace or knee support.  Adhesive tapes to the skin.  Surgery to remove damaged cartilage or move the patella to a better position. This is rare. Follow these instructions at home: If you have a shoe or brace:  Wear the shoe or brace as told by your health care provider. Remove it only as told by your health care provider.  Loosen the shoe or brace if your toes tingle, become numb, or turn cold and blue.  Keep the shoe or brace clean.  If the shoe or brace is not waterproof: ? Do not let  it get wet. ? Cover it with a watertight covering when you take a bath or a shower. Managing pain, stiffness, and swelling  If directed, put ice on the painful area. ? If you have a removable shoe or brace, remove it as told by your health care provider. ? Put ice in a plastic bag. ? Place a towel between your skin and the bag. ? Leave the ice on for 20 minutes, 2-3 times a day.  Move your toes often to avoid stiffness and to lessen swelling.  Rest your knee: ? Avoid activities that cause knee pain. ? When sitting or lying down, raise (elevate) the injured area  above the level of your heart, whenever possible. General instructions  Take over-the-counter and prescription medicines only as told by your health care provider.  Use splints, braces, knee supports, or walking aids as directed by your health care provider.  Perform stretching and strengthening exercises as told by your health care provider or physical therapist.  Do not use any products that contain nicotine or tobacco, such as cigarettes and e-cigarettes. These can delay healing. If you need help quitting, ask your health care provider.  Return to your normal activities as told by your health care provider. Ask your health care provider what activities are safe for you.  Keep all follow-up visits as told by your health care provider. This is important. Contact a health care provider if:  Your symptoms get worse.  You are not improving with home care. Summary  Patellofemoral pain syndrome is a condition in which the tissue (cartilage) on the underside of the kneecap (patella) softens or breaks down.  This condition causes swelling in the joint linings and bone surfaces in the knee. This leads to pain in the front of the knee.  This condition may be treated at home with rest, ice, compression, and elevation (RICE).  Use splints, braces, knee supports, or walking aids as directed by your health care provider. This information is not intended to replace advice given to you by your health care provider. Make sure you discuss any questions you have with your health care provider. Document Released: 05/01/2009 Document Revised: 06/23/2017 Document Reviewed: 06/23/2017 Elsevier Interactive Patient Education  2019 [employer].    Migraine Headache A migraine headache is an intense, throbbing pain on one side or both sides of the head. Migraines may also cause other symptoms, such as nausea, vomiting, and sensitivity to light and noise. What are the causes? Doing or taking certain  things may also trigger migraines, such as:  Alcohol.  Smoking.  Medicines, such as: ? Medicine used to treat chest pain (nitroglycerine). ? Birth control pills. ? Estrogen pills. ? Certain blood pressure medicines.  Aged cheeses, chocolate, or caffeine.  Foods or drinks that contain nitrates, glutamate, aspartame, or tyramine.  Physical activity. Other things that may trigger a migraine include:  Menstruation.  Pregnancy.  Hunger.  Stress, lack of sleep, too much sleep, or fatigue.  Weather changes. What increases the risk? The following factors may make you more likely to experience migraine headaches:  Age. Risk increases with age.  Family history of migraine headaches.  Being Caucasian.  Depression and anxiety.  Obesity.  Being a woman.  Having a hole in the heart (patent foramen ovale) or other heart problems. What are the signs or symptoms? The main symptom of this condition is pulsating or throbbing pain. Pain may:  Happen in any area of the head, such as on one side  or both sides.  Interfere with daily activities.  Get worse with physical activity.  Get worse with exposure to bright lights or loud noises. Other symptoms may include:  Nausea.  Vomiting.  Dizziness.  General sensitivity to bright lights, loud noises, or smells. Before you get a migraine, you may get warning signs that a migraine is developing (aura). An aura may include:  Seeing flashing lights or having blind spots.  Seeing bright spots, halos, or zigzag lines.  Having tunnel vision or blurred vision.  Having numbness or a tingling feeling.  Having trouble talking.  Having muscle weakness. How is this diagnosed? A migraine headache can be diagnosed based on:  Your symptoms.  A physical exam.  Tests, such as CT scan or MRI of the head. These imaging tests can help rule out other causes of headaches.  Taking fluid from the spine (lumbar puncture) and analyzing  it (cerebrospinal fluid analysis, or CSF analysis). How is this treated? A migraine headache is usually treated with medicines that:  Relieve pain.  Relieve nausea.  Prevent migraines from coming back. Treatment may also include:  Acupuncture.  Lifestyle changes like avoiding foods that trigger migraines. Follow these instructions at home: Medicines  Take over-the-counter and prescription medicines only as told by your health care provider.  Do not drive or use heavy machinery while taking prescription pain medicine.  To prevent or treat constipation while you are taking prescription pain medicine, your health care provider may recommend that you: ? Drink enough fluid to keep your urine clear or pale yellow. ? Take over-the-counter or prescription medicines. ? Eat foods that are high in fiber, such as fresh fruits and vegetables, whole grains, and beans. ? Limit foods that are high in fat and processed sugars, such as fried and sweet foods. Lifestyle  Avoid alcohol use.  Do not use any products that contain nicotine or tobacco, such as cigarettes and e-cigarettes. If you need help quitting, ask your health care provider.  Get at least 8 hours of sleep every night.  Limit your stress. General instructions      Keep a journal to find out what may trigger your migraine headaches. For example, write down: ? What you eat and drink. ? How much sleep you get. ? Any change to your diet or medicines.  If you have a migraine: ? Avoid things that make your symptoms worse, such as bright lights. ? It may help to lie down in a dark, quiet room. ? Do not drive or use heavy machinery. ? Ask your health care provider what activities are safe for you while you are experiencing symptoms.  Keep all follow-up visits as told by your health care provider. This is important. Contact a health care provider if:  You develop symptoms that are different or more severe than your usual  migraine symptoms. Get help right away if:  Your migraine becomes severe.  You have a fever.  You have a stiff neck.  You have vision loss.  Your muscles feel weak or like you cannot control them.  You start to lose your balance often.  You develop trouble walking.  You faint. This information is not intended to replace advice given to you by your health care provider. Make sure you discuss any questions you have with your health care provider. Document Released: 05/13/2005 Document Revised: 12/01/2015 Document Reviewed: 10/30/2015 Elsevier Interactive Patient Education  2019 [employer].

## 2018-07-09 NOTE — Progress Notes (Signed)
Ashlee Newton is a 23 y.o. female who presents to [facility] today for back and knee pain, headache, and hearing difficulties.   Back pain: Ashlee Newton has had back pain since the summer of 2017, and was seen on 12/29/15. After this appointment, she tried flexeril and home exercises without relief. Did not go to PT as she was unable to afford this at the time. She continues to have sharp pain that shoots straight down her spine from her neck to her butt. It occasionally radiates down the back of her thighs.   Right knee pain: Ashlee Newton injured her right knee playing softball as a catcher when she was 23 years old. She was told that she had meniscus problems, but never had any surgeries. She generally has slight pain in her right knee, but over the past two weeks, this has worsened. 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 Yesterday, when she squatted, she was unable to stand straight up. She also has pain just with walking her dog. She has tried RICE without relief.   Headache: Ashlee Newton has had a headache for the past two weeks. No history of headaches. The location of this headache is not consistent, but has involved the base of her neck and across her forehead. Tried NSAIDs without relief. Over these two weeks, she has experienced photophobia, mild dizziness, and some blurry vision. No syncope or vomiting. Does not wear glasses or contacts. No changes in medications recently.   Hearing: Ashlee Newton notes having difficulty hearing out of both ears, but left worse than right. She has a childhood history of ear infections and tympanostomy tubes. She would like a referral to an audiologist to get hearing aids.   Sore throat: She has had throat pain on the right side for the past week. No fevers, chills, congestion, or runny nose. She has a history of tonsil stones and had her tonsils removed in May 2019. She also has a history of allergies  for which she takes daily levocetirizine, Singulair, and dymista nasal spray.     ROS:  No , visual changes, nausea, vomiting, diarrhea, constipation, dizziness, abdominal pain, skin rash, fevers, chills, night sweats, weight loss, swollen lymph nodes,  chest pain, shortness of breath, mood changes, visual or auditory hallucinations.    Exam:  BP (!) 145/84   Pulse 88   Ht 5\' 4"  (1.626 m)   Wt 209 lb (94.8 kg)   LMP 06/18/2018   BMI 35.87 kg/m  Wt Readings from Last 5 Encounters:  07/09/18 209 lb (94.8 kg)  05/22/18 205 lb (93 kg)  05/23/16 181 lb (82.1 kg)  12/29/15 179 lb (81.2 kg) (94 %, Z= 1.57)*  08/06/15 165 lb 8 oz (75.1 kg) (90 %, Z= 1.29)*   * Growth percentiles are based on CDC (Girls, 2-20 Years) data.   General: Well Developed, well nourished, and in no acute distress.  Neuro/Psych: Alert and oriented x3, extra-ocular muscles intact, able to move all 4 extremities, sensation grossly intact. CN I - XII intact.  HEENT: Drainage seen in the posterior oropharynx. TM NL BL. No impacted cerumen. No papilledema on fundus exam. Skin: Warm and dry, no rashes noted.  Respiratory: Not using accessory muscles, speaking in full sentences, trachea midline.  Clear to auscultation bilaterally Cardiovascular: Pulses palpable, no extremity edema.  Regular rate and rhythm no murmurs rubs or gallops Abdomen: Does not appear distended.  NABS  nontender nondistended Extremities: Mildly tender to palpation over upper back, upper arms, forearms, and calves bilaterally. No tenderness over hips and thighs bilaterally.  MSK:  L-spine: Normal appearance. No erythema, edema, or rashes.  No tenderness to palpation midline.  Minimally tender to palpation lumbar paraspinal musculature. Normal ROM, no limitations due to pain.  Strength 5/5 when standing on heels and toes.  Right knee: Normal appearance. No erythema, edema or rashes.  Mildly tender to palpation over the joint line both medially  and laterally.  Normal ROM.  Strength 4/5 with resisted knee flexion and extension.  Diffusely tender throughout with mild guarding during exam and special testing.  Negative McMurray test. Negative anterior drawer. Negative varus and valgus stress tests. Pulses and capillary refill normal.   Contralateral left knee normal-appearing normal motion normal strength stable ligamentous exam.  Nontender.  Left knee: Normal appearance, ROM, and strength. Pulses and capillary refill normal.    Lab and Radiology Results X-ray images personally independently reviewed Right knee: No fractures.  No significant degenerative changes.  Normal-appearing x-ray. Await formal radiology review  Cervical spine x-ray 12/29/15: CERVICAL SPINE - COMPLETE 4+ VIEW  COMPARISON:  None.  FINDINGS: Frontal, lateral, open-mouth odontoid, and bilateral oblique views were obtained. There is no fracture or spondylolisthesis. Prevertebral soft tissues and predental space regions are normal. The disc spaces appear normal. There is no appreciable exit foraminal narrowing on the oblique views.  IMPRESSION: No fracture or spondylolisthesis.  No apparent arthropathy.   Electronically Signed   By: Bretta BangWilliam  Woodruff III M.D.   On: 12/29/2015 10:57  Lumbar spine x-ray 12/29/15: LUMBAR SPINE - COMPLETE 4+ VIEW  COMPARISON:  None.  FINDINGS: Frontal, lateral, spot lumbosacral lateral, and bilateral oblique views were obtained. There are 5 non-rib-bearing lumbar type vertebral bodies. There is slight lumbar dextroscoliosis. There is no fracture or spondylolisthesis. The disc spaces appear normal. There is no appreciable facet arthropathy.  IMPRESSION: Slight scoliosis. No fracture or spondylolisthesis. No appreciable arthropathic change.   Electronically Signed   By: Bretta BangWilliam  Woodruff III M.D.   On: 12/29/2015 10:58 I personally (independently) visualized and performed the interpretation of  the images attached in this note.   Assessment and Plan: 23 y.o. female with  Back pain: Ashlee Newton has had sharp back along her spine from her neck to her butt since the summer of 2017. She has tried flexeril and home exercises without relief. Fairly normal back exam. Normal cervical and lumbar x-rays in 2017. Concerned for fibromyalgia. She does have tenderness to palpation over her arms, calves, and upper back. Will keep this in mind if her pain continues after PT. Attend PT. Follow-up in one month.   Right knee pain: Ashlee Newton has had right knee pain for the past two weeks. She has a history of right knee injury when she was 15. Knee x-ray did not show fractures or dislocations. She was diffusely tender throughout her knee exam. No positive special tests. Most likely patellofemoral pain syndrome. Prescribed diclofenac gel for pain. Attend PT. Follow-up in one month.   Headache: Ashlee Newton has had a new onset headache with photophobia for the past two weeks, not relieved with NSAIDs. No history of headaches. Concerned for pseudotumor cerebri vs migraine.  Normal cranial nerve exam. No papilledema on fundus exam, making pseudotumor cerebri less likely. Most likely migraines. Prescribed Imitrex for headache. She uses OCP for birth control and is not currently sexually active. Follow-up in one month or sooner if symptoms do not resolve.  Decreased  hearing: Prapti complains of decreased hearing, with her left ear worse than the right. Referred to audiology.  Sore throat: Caresa has has sore throat for the past week. No upper respiratory symptoms. Possibly related to her allergies or acid reflux. Prescribed omeprazole and Atrovent nasal spray. Follow-up in one months.    Knee pain is severe.   Orders Placed This Encounter  Procedures  . DG Knee Complete 4 Views Right    Please include patellar sunrise, lateral, and weightbearing bilateral AP and bilateral rosenberg views    Standing Status:   Future    Number  of Occurrences:   1    Standing Expiration Date:   09/08/2019    Order Specific Question:   Reason for exam:    Answer:   Please include patellar sunrise, lateral, and weightbearing bilateral AP and bilateral rosenberg views    Comments:   Please include patellar sunrise, lateral, and weightbearing bilateral AP and bilateral rosenberg views    Order Specific Question:   Preferred imaging location?    Answer:   [facility]  . DG Knee 1-2 Views Left    Standing Status:   Future    Number of Occurrences:   1    Standing Expiration Date:   09/09/2019    Order Specific Question:   Reason for Exam (SYMPTOM  OR DIAGNOSIS REQUIRED)    Answer:   For use with right knee x-ray, bilateral AP and Rosenberg standing.    Order Specific Question:   Is the patient pregnant?    Answer:   No    Order Specific Question:   Preferred imaging location?    Answer:   [facility]  . Ambulatory referral to Audiology    Referral Priority:   Routine    Referral Type:   Audiology Exam    Referral Reason:   Specialty Services Required    Number of Visits Requested:   1  . Ambulatory referral to Physical Therapy    Referral Priority:   Routine    Referral Type:   Physical Medicine    Referral Reason:   Specialty Services Required    Requested Specialty:   Physical Therapy   Meds ordered this encounter  Medications  . ipratropium (ATROVENT) 0.06 % nasal spray    Sig: Place 2 sprays into both nostrils every 4 (four) hours as needed.    Dispense:  10 mL    Refill:  6  . omeprazole (PRILOSEC) 40 MG capsule    Sig: Take 1 capsule (40 mg total) by mouth daily.    Dispense:  30 capsule    Refill:  3  . diclofenac sodium (VOLTAREN) 1 % GEL    Sig: Apply 4 g topically 4 (four) times daily. To affected joint.    Dispense:  100 g    Refill:  11  . SUMAtriptan (IMITREX) 50 MG tablet    Sig: Take 1 tablet (50 mg total) by mouth every 2 (two) hours as needed for migraine. May repeat in 2 hours if  headache persists or recurs.    Dispense:  12 tablet    Refill:  0    Historical information moved to improve visibility of documentation.  Past Medical History:  Diagnosis Date  . Allergies   . Allergy   . Migraine    Past Surgical History:  Procedure Laterality Date  . TONSILLECTOMY AND ADENOIDECTOMY    . TYMPANOSTOMY TUBE PLACEMENT     Social History   Tobacco  Use  . Smoking status: Never Smoker  . Smokeless tobacco: Never Used  Substance Use Topics  . Alcohol use: No   family history includes Cancer in her maternal grandfather; Hypercholesterolemia in her mother; Hypertension in her father; Migraines in her mother.  Medications: Current Outpatient Medications  Medication Sig Dispense Refill  . drospirenone-ethinyl estradiol (YAZ,GIANVI,LORYNA) 3-0.02 MG tablet Take 1 tablet by mouth daily. 1 Package 11  . levocetirizine (XYZAL) 5 MG tablet Take by mouth.    . montelukast (SINGULAIR) 10 MG tablet Take by mouth.    . triamcinolone cream (KENALOG) 0.1 % Apply 1 application topically 2 (two) times daily. 30 g 0  . diclofenac sodium (VOLTAREN) 1 % GEL Apply 4 g topically 4 (four) times daily. To affected joint. 100 g 11  . ipratropium (ATROVENT) 0.06 % nasal spray Place 2 sprays into both nostrils every 4 (four) hours as needed. 10 mL 6  . omeprazole (PRILOSEC) 40 MG capsule Take 1 capsule (40 mg total) by mouth daily. 30 capsule 3  . SUMAtriptan (IMITREX) 50 MG tablet Take 1 tablet (50 mg total) by mouth every 2 (two) hours as needed for migraine. May repeat in 2 hours if headache persists or recurs. 12 tablet 0   No current facility-administered medications for this visit.    No Known Allergies    Discussed warning signs or symptoms. Please see discharge instructions. Patient expresses understanding.   I personally was present and performed or re-performed the history, physical exam and medical decision-making activities of this service and have verified that the  service and findings are accurately documented in the student's note. ___________________________________________ Clementeen GrahamEvan Meldrick Buttery M.D., ABFM., CAQSM. Primary Care and Sports Medicine Adjunct Instructor of Family Medicine  [employer]

## 2018-07-13 ENCOUNTER — Emergency Department
Admission: EM | Admit: 2018-07-13 | Discharge: 2018-07-13 | Disposition: A | Discharge status: Home / Self Care | Payer: BLUE CROSS/BLUE SHIELD | Source: Home / Self Care

## 2018-07-13 ENCOUNTER — Encounter: Payer: Self-pay | Admitting: Emergency Medicine

## 2018-07-13 DIAGNOSIS — R112 Nausea with vomiting, unspecified: Secondary | ICD-10-CM | POA: Diagnosis not present

## 2018-07-13 DIAGNOSIS — R197 Diarrhea, unspecified: Secondary | ICD-10-CM

## 2018-07-13 DIAGNOSIS — R109 Unspecified abdominal pain: Secondary | ICD-10-CM

## 2018-07-13 DIAGNOSIS — R6883 Chills (without fever): Secondary | ICD-10-CM

## 2018-07-13 MED ORDER — ONDANSETRON 4 MG PO TBDP: 4.0000 mg | ORAL_TABLET | Freq: Three times a day (TID) | ORAL | 0 refills | Status: DC | PRN

## 2018-07-13 NOTE — ED Provider Notes (Signed)
[facility]    CSN: [id number] Arrival date & time: 07/13/18  1322     History   Chief Complaint Chief ComplainIvar Newton  Patient presents with  . Nausea    HPI Ashlee Newton is a 23 y.o. female.   HPI Ashlee QuantKayla K Newton is a [age] y.o. female presenting to UC with c/o chills and body aches for about 1 week, associated generalized abdominal cramping, nausea, vomiting x1, and diarrhea x5 since this morning.  Denies recorded fever. She has not tried to eat or drink anything this morning but was able to eat last night and felt fine.  Her friend reports eating the same food and did not get sick.  Denies cough, congestion, recorded fever. No urinary symptoms.    Past Medical History:  Diagnosis Date  . Allergies   . Allergy   . Migraine     Patient Active Problem List   Diagnosis Date Noted  . Cervical radiculitis 12/29/2015  . Lumbar radiculopathy 12/29/2015  . Seasonal allergies 07/04/2011  . Irregular menses 07/04/2011  . Headache(784.0) 07/04/2011    Past Surgical History:  Procedure Laterality Date  . TONSILLECTOMY AND ADENOIDECTOMY    . TYMPANOSTOMY TUBE PLACEMENT      OB History    Gravida  0   Para  0   Term  0   Preterm  0   AB  0   Living  0     SAB  0   TAB  0   Ectopic  0   Multiple  0   Live Births  0            Home Medications    Prior to Admission medications   Medication Sig Start Date End Date Taking? Authorizing Provider  diclofenac sodium (VOLTAREN) 1 % GEL Apply 4 g topically 4 (four) times daily. To affected joint. 07/09/18   Rodolph Bongorey, Evan S, MD  drospirenone-ethinyl estradiol Pierre Bali(YAZ,GIANVI,LORYNA) 3-0.02 MG tablet Take 1 tablet by mouth daily. 06/23/18   Donette LarryBhambri, Melanie, CNM  ipratropium (ATROVENT) 0.06 % nasal spray Place 2 sprays into both nostrils every 4 (four) hours as needed. 07/09/18   Rodolph Bongorey, Evan S, MD  levocetirizine (XYZAL) 5 MG tablet Take by mouth.    [provider]  montelukast (SINGULAIR) 10 MG tablet  Take by mouth.    [provider]  omeprazole (PRILOSEC) 40 MG capsule Take 1 capsule (40 mg total) by mouth daily. 07/09/18   Rodolph Bongorey, Evan S, MD  ondansetron (ZOFRAN ODT) 4 MG disintegrating tablet Take 1 tablet (4 mg total) by mouth every 8 (eight) hours as needed for nausea or vomiting. 07/13/18   Lurene ShadowPhelps, Caressa Scearce O, PA-C  SUMAtriptan (IMITREX) 50 MG tablet Take 1 tablet (50 mg total) by mouth every 2 (two) hours as needed for migraine. May repeat in 2 hours if headache persists or recurs. 07/09/18   Rodolph Bongorey, Evan S, MD  triamcinolone cream (KENALOG) 0.1 % Apply 1 application topically 2 (two) times daily. 05/23/16   Lattie HawBeese, Stephen A, MD    Family History Family History  Problem Relation Age of Onset  . Migraines Mother   . Hypercholesterolemia Mother   . Hypertension Father   . Cancer Maternal Grandfather        pancreatitis --> pancreatic CA    Social History Social History   Tobacco Use  . Smoking status: Never Smoker  . Smokeless tobacco: Never Used  Substance Use Topics  . Alcohol use: Yes  . Drug  use: No     Allergies   Patient has no known allergies.   Review of Systems Review of Systems  Constitutional: Positive for chills. Negative for fatigue and fever.  Gastrointestinal: Positive for abdominal pain, diarrhea, nausea and vomiting. Negative for blood in stool and constipation.  Genitourinary: Negative for dysuria and frequency.  Musculoskeletal: Negative for back pain and myalgias.  Neurological: Negative for dizziness, syncope, light-headedness and headaches.     Physical Exam Triage Vital Signs ED Triage Vitals  Enc Vitals Group     BP 07/13/18 1355 120/79     Pulse Rate 07/13/18 1355 (!) 102     Resp --      Temp 07/13/18 1355 98.5 F (36.9 C)     Temp Source 07/13/18 1355 Oral     SpO2 07/13/18 1355 98 %     Weight 07/13/18 1356 210 lb (95.3 kg)     Height 07/13/18 1356 5\' 4"  (1.626 m)     Head Circumference --      Peak Flow --      Pain Score  07/13/18 1356 4     Pain Loc --      Pain Edu? --      Excl. in GC? --    No data found.  Updated Vital Signs BP 120/79 (BP Location: Right Arm)   Pulse (!) 102   Temp 98.5 F (36.9 C) (Oral)   Ht 5\' 4"  (1.626 m)   Wt 210 lb (95.3 kg)   LMP 06/18/2018   SpO2 98%   BMI 36.05 kg/m   Visual Acuity Right Eye Distance:   Left Eye Distance:   Bilateral Distance:    Right Eye Near:   Left Eye Near:    Bilateral Near:     Physical Exam Vitals signs and nursing note reviewed.  Constitutional:      Appearance: Normal appearance. She is well-developed.  HENT:     Head: Normocephalic and atraumatic.     Right Ear: Tympanic membrane normal.     Left Ear: Tympanic membrane normal.     Nose: Nose normal.     Right Sinus: No maxillary sinus tenderness or frontal sinus tenderness.     Left Sinus: No maxillary sinus tenderness or frontal sinus tenderness.     Mouth/Throat:     Lips: Pink.     Mouth: Mucous membranes are moist.     Pharynx: Oropharynx is clear. Uvula midline.  Neck:     Musculoskeletal: Normal range of motion.  Cardiovascular:     Rate and Rhythm: Normal rate and regular rhythm.  Pulmonary:     Effort: Pulmonary effort is normal. No respiratory distress.     Breath sounds: Normal breath sounds. No stridor. No wheezing or rhonchi.  Abdominal:     General: Bowel sounds are normal. There is no distension.     Palpations: Abdomen is soft. There is no mass.     Tenderness: There is no abdominal tenderness. There is no right CVA tenderness, left CVA tenderness, guarding or rebound.     Hernia: No hernia is present.  Musculoskeletal: Normal range of motion.  Skin:    General: Skin is warm and dry.  Neurological:     Mental Status: She is alert and oriented to person, place, and time.  Psychiatric:        Behavior: Behavior normal.      UC Treatments / Results  Labs (all labs ordered are listed, but only abnormal results  are displayed) Labs Reviewed - No  data to display  EKG None  Radiology No results found.  Procedures Procedures (including critical care time)  Medications Ordered in UC Medications - No data to display  Initial Impression / Assessment and Plan / UC Course  I have reviewed the triage vital signs and the nursing notes.  Pertinent labs & imaging results that were available during my care of the patient were reviewed by me and considered in my medical decision making (see chart for details).     Hx and exam c/w viral illness Benign abdominal exam Encouraged symptomatic tx AVS provided  Final Clinical Impressions(s) / UC Diagnoses   Final diagnoses:  Nausea vomiting and diarrhea  Abdominal cramping  Chills     Discharge Instructions      Be sure to get a lot of rest and stay well hydrated with sports drinks, water, diluted juices, and clear sodas.  Avoid fried fatty food, spicy food, and milk as these foods can cause worsening stomach upset.   Please follow up with family medicine in 3-4 days if not improving, sooner if worsening.     ED Prescriptions    Medication Sig Dispense Auth. Provider   ondansetron (ZOFRAN ODT) 4 MG disintegrating tablet Take 1 tablet (4 mg total) by mouth every 8 (eight) hours as needed for nausea or vomiting. 12 tablet Lurene Shadow, PA-C     Controlled Substance Prescriptions [place] Controlled Substance Registry consulted? Not Applicable   Rolla Plate 07/13/18 1408

## 2018-07-13 NOTE — ED Triage Notes (Signed)
Body aches x 1 week, Vomited x1 this morning

## 2018-07-13 NOTE — Discharge Instructions (Signed)
°  Be sure to get a lot of rest and stay well hydrated with sports drinks, water, diluted juices, and clear sodas.  Avoid fried fatty food, spicy food, and milk as these foods can cause worsening stomach upset.   Please follow up with family medicine in 3-4 days if not improving, sooner if worsening.

## 2018-07-16 ENCOUNTER — Other Ambulatory Visit: Payer: Self-pay

## 2018-07-16 ENCOUNTER — Encounter: Payer: Self-pay | Admitting: Physical Therapy

## 2018-07-16 ENCOUNTER — Ambulatory Visit: Payer: BLUE CROSS/BLUE SHIELD | Attending: Family Medicine | Admitting: Physical Therapy

## 2018-07-16 DIAGNOSIS — M25561 Pain in right knee: Secondary | ICD-10-CM | POA: Insufficient documentation

## 2018-07-16 DIAGNOSIS — G8929 Other chronic pain: Secondary | ICD-10-CM | POA: Insufficient documentation

## 2018-07-16 DIAGNOSIS — M6281 Muscle weakness (generalized): Secondary | ICD-10-CM | POA: Insufficient documentation

## 2018-07-16 NOTE — Therapy (Signed)
[facility] [address] [place], [place], [address] Phone: [phone]   Fax:  [phone]  Physical Therapy Evaluation  Patient Details  Name: Ashlee Newton MRN: [id number] Date of Birth: 1995/12/14 Referring Provider (PT): Clementeen Graham MD   Encounter Date: 07/16/2018  PT End of Session - 07/16/18 1351    Visit Number  1    Number of Visits  8    Date for PT Re-Evaluation  08/13/18    PT Start Time  0100    PT Stop Time  0145    PT Time Calculation (min)  45 min    Activity Tolerance  Patient tolerated treatment well    Behavior During Therapy  [facility] for tasks assessed/performed       Past Medical History:  Diagnosis Date  . Allergies   . Allergy   . Migraine     Past Surgical History:  Procedure Laterality Date  . TONSILLECTOMY AND ADENOIDECTOMY    . TYMPANOSTOMY TUBE PLACEMENT      There were no vitals filed for this visit.   Subjective Assessment - 07/16/18 1337    Subjective  The patient presents to the clinic today with a CC of right knee pain.  She reports ongoing knee pain for 5 years which she contributes to softball injuries.  She states she has had a long h/o back pain as well but her doctor gave her exercises which she has been doing.  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  In fact, patient left work to come to therapy and reports severe right knee pain today.  Rest decreases her pain.  Her goal is to get back to the gym.    Limitations  Standing    How long can you stand comfortably?  15 minutes.    Currently in Pain?  Yes    Pain Score  10-Worst pain ever    Pain Location  Knee    Pain Orientation  Right    Pain Descriptors / Indicators  Aching;Throbbing;Dull    Pain Type  Chronic pain    Pain Onset  More than a month ago    Pain Frequency  Constant    Aggravating Factors   See above.     Pain Relieving Factors  See above.         [facility] PT Assessment - 07/16/18 0001      Assessment   Medical Diagnosis  Patellofemoral pain syndrome of right knee.    Referring Provider (PT)  Clementeen Graham MD    Onset Date/Surgical Date  --   5 years.     Precautions   Precautions  --   PAIN-FREE RIGHT LE THER EX.     Restrictions   Weight Bearing Restrictions  No      Balance Screen   Has the patient fallen in the past 6 months  No    Has the patient had a decrease in activity level because of a fear of falling?   No    Is the patient reluctant to leave their home because of a fear of falling?   No      Home Public house manager residence      Prior Function   Level of Independence  Independent      Posture/Postural Control   Posture Comments  Bilateral genu valgum.  Right patellar lateral tilt.      ROM / Strength   AROM / PROM / Strength  AROM;Strength      AROM   Overall AROM Comments  Full right knee flexion and extension.  Right SLR= 80 degrees.      Strength   Overall Strength Comments  Right hip abdution and ER as well as knee strength= 4 to 4+/5.      Palpation   Palpation comment  Pain complaints around right kneecap.      Special Tests   Other special tests  Positive right knee patellofemoral compression test.  Normal right knee stability.  Decreased right lateral patellar movement.      Ambulation/Gait   Gait Comments  WNL.                Objective measurements completed on examination: See above findings.      [facility] Adult PT Treatment/Exercise - 07/16/18 0001      Modalities   Modalities  Vasopneumatic      Vasopneumatic   Number Minutes Vasopneumatic   15 minutes    Vasopnuematic Location   --   Right knee.   Vasopneumatic Pressure  Low                  PT Long Term Goals - 07/16/18 1542      PT LONG TERM GOAL #1   Title  Independent with a HEP/gym program.    Time  4    Period  Weeks     Status  New             Plan - 07/16/18 1522    Clinical Impression Statement  The patient presents to OPPT with a CC of right knee pain.  Her pain increases with standing and walking.  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  Her postural is remarkable for bilateral genu valgum.  She also has a right patellar tilt and decreased patellar mobility moving laterally.  She also has some weakness in her right hip and knee.  Her pain limits her from performing her job duties as she would like.    Patient will benefit from skilled physical therapy intervention to address deficits and pain.    Clinical Presentation  Evolving    Clinical Presentation due to:  Not improving.    Clinical Decision Making  Low    Rehab Potential  Good    PT Frequency  2x / week    PT Duration  4 weeks    PT Treatment/Interventions  Cryotherapy;Electrical Stimulation;Moist Heat;Therapeutic exercise;Therapeutic activities;Patient/family education;Manual techniques;Passive range of motion;Vasopneumatic Device    PT Next Visit Plan  Please review HEP for good technique.  PAIN-FREE right quad strengthening (O and CKC exercises).  Right patellar mobility. Modalities PRN.  Patient would like to limit visits.    Consulted and Agree with Plan of Care  Patient       Patient will benefit from skilled therapeutic intervention in order to improve the following deficits and impairments:  Pain, Decreased activity tolerance, Decreased strength  Visit Diagnosis: Chronic pain of right knee - Plan: PT plan of care cert/re-cert  Muscle weakness (generalized) - Plan: PT plan of care cert/re-cert     Problem List Patient Active Problem List   Diagnosis Date Noted  . Cervical radiculitis 12/29/2015  . Lumbar radiculopathy 12/29/2015  . Seasonal  allergies 07/04/2011  . Irregular menses 07/04/2011  . Headache(784.0) 07/04/2011    APPLEGATE, [place]  MPT 07/16/2018, 3:50 PM  [facility] [address] [place], [place], [address] Phone: [phone]   Fax:  [phone]  Name: Ashlee Newton MRN: [id number] Date of Birth: 29-Oct-1995

## 2018-07-19 ENCOUNTER — Emergency Department
Admission: EM | Admit: 2018-07-19 | Discharge: 2018-07-19 | Disposition: A | Discharge status: Home / Self Care | Payer: BLUE CROSS/BLUE SHIELD | Source: Home / Self Care

## 2018-07-19 ENCOUNTER — Encounter: Payer: Self-pay | Admitting: Emergency Medicine

## 2018-07-19 DIAGNOSIS — R300 Dysuria: Secondary | ICD-10-CM | POA: Diagnosis not present

## 2018-07-19 DIAGNOSIS — N3001 Acute cystitis with hematuria: Secondary | ICD-10-CM

## 2018-07-19 LAB — POCT URINALYSIS DIP (MANUAL ENTRY)
Bilirubin, UA: NEGATIVE
Glucose, UA: NEGATIVE mg/dL
Ketones, POC UA: NEGATIVE mg/dL
Nitrite, UA: NEGATIVE
Protein Ur, POC: NEGATIVE mg/dL
Spec Grav, UA: 1.02 (ref 1.010–1.025)
Urobilinogen, UA: 0.2 E.U./dL
pH, UA: 6 (ref 5.0–8.0)

## 2018-07-19 MED ORDER — CEPHALEXIN 500 MG PO CAPS: 500.0000 mg | ORAL_CAPSULE | Freq: Two times a day (BID) | ORAL | 0 refills | Status: DC

## 2018-07-19 NOTE — ED Provider Notes (Signed)
[facility]    CSN: [id number] Arrival date & time: 07/19/18  1654     History   Chief Complaint Chief Complaint  Patient presents with  . Urinary Tract Infection    HPI Ashlee Newton is a 23 y.o. female.   HPI Ashlee Newton is a 23 y.o. female presenting to UC with c/o dysuria with urgency and frequency that started earlier today. Hx of UTIs, symptoms feel similar. Mild lower abdominal pain and low back pain. Denies fever, chills, n/v/d. No vaginal symptoms.    Past Medical History:  Diagnosis Date  . Allergies   . Allergy   . Migraine     Patient Active Problem List   Diagnosis Date Noted  . Cervical radiculitis 12/29/2015  . Lumbar radiculopathy 12/29/2015  . Seasonal allergies 07/04/2011  . Irregular menses 07/04/2011  . Headache(784.0) 07/04/2011    Past Surgical History:  Procedure Laterality Date  . TONSILLECTOMY AND ADENOIDECTOMY    . TYMPANOSTOMY TUBE PLACEMENT      OB History    Gravida  0   Para  0   Term  0   Preterm  0   AB  0   Living  0     SAB  0   TAB  0   Ectopic  0   Multiple  0   Live Births  0            Home Medications    Prior to Admission medications   Medication Sig Start Date End Date Taking? Authorizing Provider  cephALEXin (KEFLEX) 500 MG capsule Take 1 capsule (500 mg total) by mouth 2 (two) times daily. 07/19/18   Lurene Shadow, PA-C  diclofenac sodium (VOLTAREN) 1 % GEL Apply 4 g topically 4 (four) times daily. To affected joint. 07/09/18   Rodolph Bong, MD  drospirenone-ethinyl estradiol Pierre Bali) 3-0.02 MG tablet Take 1 tablet by mouth daily. 06/23/18   Donette Larry, CNM  ipratropium (ATROVENT) 0.06 % nasal spray Place 2 sprays into both nostrils every 4 (four) hours as needed. 07/09/18   Rodolph Bong, MD  levocetirizine (XYZAL) 5 MG tablet Take by mouth.    [provider]  montelukast (SINGULAIR) 10 MG tablet Take by mouth.    [provider]  omeprazole  (PRILOSEC) 40 MG capsule Take 1 capsule (40 mg total) by mouth daily. 07/09/18   Rodolph Bong, MD  ondansetron (ZOFRAN ODT) 4 MG disintegrating tablet Take 1 tablet (4 mg total) by mouth every 8 (eight) hours as needed for nausea or vomiting. Patient not taking: Reported on 07/16/2018 07/13/18   Lurene Shadow, PA-C  SUMAtriptan (IMITREX) 50 MG tablet Take 1 tablet (50 mg total) by mouth every 2 (two) hours as needed for migraine. May repeat in 2 hours if headache persists or recurs. 07/09/18   Rodolph Bong, MD  triamcinolone cream (KENALOG) 0.1 % Apply 1 application topically 2 (two) times daily. 05/23/16   Lattie Haw, MD    Family History Family History  Problem Relation Age of Onset  . Migraines Mother   . Hypercholesterolemia Mother   . Hypertension Father   . Cancer Maternal Grandfather        pancreatitis --> pancreatic CA    Social History Social History   Tobacco Use  . Smoking status: Never Smoker  . Smokeless tobacco: Never Used  Substance Use Topics  . Alcohol use: Yes  . Drug use: No  Allergies   Patient has no known allergies.   Review of Systems Review of Systems  Constitutional: Negative for chills and fever.  Gastrointestinal: Positive for abdominal pain. Negative for diarrhea, nausea and vomiting.  Genitourinary: Positive for dysuria, frequency and urgency. Negative for hematuria, vaginal bleeding, vaginal discharge and vaginal pain.  Musculoskeletal: Positive for back pain. Negative for myalgias.  Neurological: Negative for dizziness and headaches.     Physical Exam Triage Vital Signs ED Triage Vitals  Enc Vitals Group     BP 07/19/18 1730 128/85     Pulse Rate 07/19/18 1730 79     Resp --      Temp 07/19/18 1730 98.1 F (36.7 C)     Temp Source 07/19/18 1730 Oral     SpO2 07/19/18 1730 99 %     Weight 07/19/18 1732 211 lb (95.7 kg)     Height 07/19/18 1732 5\' 4"  (1.626 m)     Head Circumference --      Peak Flow --      Pain Score  07/19/18 1732 0     Pain Loc --      Pain Edu? --      Excl. in GC? --    No data found.  Updated Vital Signs BP 128/85 (BP Location: Right Arm)   Pulse 79   Temp 98.1 F (36.7 C) (Oral)   Ht 5\' 4"  (1.626 m)   Wt 211 lb (95.7 kg)   SpO2 99%   BMI 36.22 kg/m   Visual Acuity Right Eye Distance:   Left Eye Distance:   Bilateral Distance:    Right Eye Near:   Left Eye Near:    Bilateral Near:     Physical Exam Vitals signs and nursing note reviewed.  Constitutional:      Appearance: Normal appearance. She is well-developed.  HENT:     Head: Normocephalic and atraumatic.  Neck:     Musculoskeletal: Normal range of motion.  Cardiovascular:     Rate and Rhythm: Normal rate and regular rhythm.  Pulmonary:     Effort: Pulmonary effort is normal.     Breath sounds: Normal breath sounds.  Abdominal:     General: There is no distension.     Palpations: Abdomen is soft.     Tenderness: There is no abdominal tenderness. There is no right CVA tenderness or left CVA tenderness.  Musculoskeletal: Normal range of motion.  Skin:    General: Skin is warm and dry.  Neurological:     Mental Status: She is alert and oriented to person, place, and time.  Psychiatric:        Behavior: Behavior normal.      UC Treatments / Results  Labs (all labs ordered are listed, but only abnormal results are displayed) Labs Reviewed  POCT URINALYSIS DIP (MANUAL ENTRY) - Abnormal; Notable for the following components:      Result Value   Blood, UA moderate (*)    Leukocytes, UA Small (1+) (*)    All other components within normal limits  URINE CULTURE    EKG None  Radiology No results found.  Procedures Procedures (including critical care time)  Medications Ordered in UC Medications - No data to display  Initial Impression / Assessment and Plan / UC Course  I have reviewed the triage vital signs and the nursing notes.  Pertinent labs & imaging results that were available  during my care of the patient were reviewed by me and  considered in my medical decision making (see chart for details).     UA c/w UTI Culture sent  Final Clinical Impressions(s) / UC Diagnoses   Final diagnoses:  Dysuria  Acute cystitis with hematuria     Discharge Instructions      Please take your antibiotic as prescribed. A urine culture has been sent to check the severity of your urinary infection and to determine if you are on the most appropriate antibiotic. The results should come back within 2-3 days and you will be notified even if no medication change is needed.  Please stay well hydrated and follow up with your family doctor in 1 week if not improving, sooner if worsening.     ED Prescriptions    Medication Sig Dispense Auth. Provider   cephALEXin (KEFLEX) 500 MG capsule Take 1 capsule (500 mg total) by mouth 2 (two) times daily. 14 capsule Lurene Shadow, PA-C     Controlled Substance Prescriptions [place] Controlled Substance Registry consulted? Not Applicable   Rolla Plate 07/20/18 1116

## 2018-07-19 NOTE — Discharge Instructions (Signed)
  Please take your antibiotic as prescribed. A urine culture has been sent to check the severity of your urinary infection and to determine if you are on the most appropriate antibiotic. The results should come back within 2-3 days and you will be notified even if no medication change is needed.  Please stay well hydrated and follow up with your family doctor in 1 week if not improving, sooner if worsening.  

## 2018-07-19 NOTE — ED Triage Notes (Signed)
Patient c/o dysuria, urgency and frequency, no hematuria.

## 2018-07-21 ENCOUNTER — Telehealth: Payer: Self-pay | Admitting: Emergency Medicine

## 2018-07-21 ENCOUNTER — Ambulatory Visit: Payer: BLUE CROSS/BLUE SHIELD | Admitting: Physical Therapy

## 2018-07-21 DIAGNOSIS — M6281 Muscle weakness (generalized): Secondary | ICD-10-CM

## 2018-07-21 DIAGNOSIS — M25561 Pain in right knee: Principal | ICD-10-CM

## 2018-07-21 DIAGNOSIS — G8929 Other chronic pain: Secondary | ICD-10-CM

## 2018-07-21 LAB — URINE CULTURE
MICRO NUMBER:: 232158
SPECIMEN QUALITY:: ADEQUATE

## 2018-07-21 NOTE — Therapy (Signed)
[facility] [address] [place], [place], [address] Phone: [phone]   Fax:  [phone]  Physical Therapy Evaluation  Patient Details  Name: Ashlee Newton MRN: [id number] Date of Birth: July 20, 1995 Referring Provider (PT): Clementeen Graham MD   Encounter Date: 07/21/2018  PT End of Session - 07/21/18 1358    Visit Number  1    Number of Visits  8    Date for PT Re-Evaluation  08/13/18    PT Start Time  0100    PT Stop Time  0153    PT Time Calculation (min)  53 min    Activity Tolerance  Patient tolerated treatment well    Behavior During Therapy  [facility] for tasks assessed/performed       Past Medical History:  Diagnosis Date  . Allergies   . Allergy   . Migraine     Past Surgical History:  Procedure Laterality Date  . TONSILLECTOMY AND ADENOIDECTOMY    . TYMPANOSTOMY TUBE PLACEMENT      There were no vitals filed for this visit.   Subjective Assessment - 07/21/18 1335    Subjective  No new complaints.    Currently in Pain?  Yes    Pain Score  5     Pain Location  Knee    Pain Orientation  Right    Pain Descriptors / Indicators  Aching;Throbbing;Dull    Pain Type  Chronic pain    Pain Onset  More than a month ago                    Objective measurements completed on examination: See above findings.      [facility] Adult PT Treatment/Exercise - 07/21/18 0001      Exercises   Exercises  Knee/Hip      Knee/Hip Exercises: Aerobic   Recumbent Bike  Level 3 x 15 minutes.      Knee/Hip Exercises: Machines for Strengthening   Cybex Knee Extension  10# x 3 minutes.    Cybex Knee Flexion  30# x 3 minutes.      Modalities   Modalities  Cryotherapy;Electrical Stimulation      Cryotherapy   Number Minutes Cryotherapy  20 Minutes    Cryotherapy Location  --   Right knee.   Type of Cryotherapy  Ice pack      Electrical Stimulation   Electrical Stimulation Location  Right knee.    Electrical Stimulation  Action  IFC    Electrical Stimulation Parameters  80-150 hz x 20 minutes.    Electrical Stimulation Goals  Pain      Manual Therapy   Manual Therapy  Joint mobilization    Joint Mobilization  Right patellar mobs in supine x 5 minutes.             PT Education - 07/21/18 1339    Education Details  Right patellar mobs, short duration ice massage.    Person(s) Educated  Patient    Methods  Explanation;Demonstration;Handout    Comprehension  Verbalized understanding;Returned demonstration          PT Long Term Goals - 07/16/18 1542      PT LONG TERM GOAL #1   Title  Independent with a HEP/gym program.    Time  4    Period  Weeks    Status  New             Plan - 07/21/18 1338    Clinical Impression  Statement  Patient did great today.  Exercises were done without pain increase.  Performed patellar mobs and patient instructed in to perform at home as well.    PT Treatment/Interventions  Cryotherapy;Electrical Stimulation;Moist Heat;Therapeutic exercise;Therapeutic activities;Patient/family education;Manual techniques;Passive range of motion;Vasopneumatic Device    PT Next Visit Plan  Ellipitcal.  PAIN-FREE CKC (ie:  Leg press).    Consulted and Agree with Plan of Care  Patient       Patient will benefit from skilled therapeutic intervention in order to improve the following deficits and impairments:     Visit Diagnosis: Chronic pain of right knee  Muscle weakness (generalized)     Problem List Patient Active Problem List   Diagnosis Date Noted  . Cervical radiculitis 12/29/2015  . Lumbar radiculopathy 12/29/2015  . Seasonal allergies 07/04/2011  . Irregular menses 07/04/2011  . Headache(784.0) 07/04/2011    APPLEGATE, [place] MPT 07/21/2018, 1:58 PM  [facility] [address] [place], [place], [address] Phone: [phone]   Fax:  [phone]  Name: Ashlee Newton MRN: [id number] Date of Birth:  [birth year]

## 2018-07-21 NOTE — Telephone Encounter (Signed)
Message left on voice mail inquiring about patient's status and encouraging patient to call with questions/concerns; her current rx Keflex should be appropriate.

## 2018-07-24 ENCOUNTER — Ambulatory Visit (INDEPENDENT_AMBULATORY_CARE_PROVIDER_SITE_OTHER): Payer: BLUE CROSS/BLUE SHIELD

## 2018-07-24 DIAGNOSIS — Z23 Encounter for immunization: Secondary | ICD-10-CM

## 2018-07-24 NOTE — Progress Notes (Signed)
Pt here for Gardasil. Injection given.  

## 2018-07-27 NOTE — Telephone Encounter (Signed)
[facility] [place]  Date of service January 08, 2018.  AHI was 1 with desaturation 84%.  No evidence of sleep apnea.

## 2018-07-30 ENCOUNTER — Encounter: Payer: Self-pay | Admitting: Physical Therapy

## 2018-07-30 ENCOUNTER — Ambulatory Visit: Payer: BLUE CROSS/BLUE SHIELD | Attending: Family Medicine | Admitting: Physical Therapy

## 2018-07-30 DIAGNOSIS — M25561 Pain in right knee: Secondary | ICD-10-CM | POA: Diagnosis present

## 2018-07-30 DIAGNOSIS — G8929 Other chronic pain: Secondary | ICD-10-CM | POA: Insufficient documentation

## 2018-07-30 DIAGNOSIS — M6281 Muscle weakness (generalized): Secondary | ICD-10-CM

## 2018-07-30 NOTE — Therapy (Addendum)
[facility] [address] [place], [place], [address] Phone: [phone]   Fax:  [phone]  Physical Therapy Treatment  Patient Details  Name: Ashlee Newton MRN: [id number] Date of Birth: 1995/12/16 Referring Provider (PT): Clementeen Graham MD   Encounter Date: 07/30/2018  PT End of Session - 07/30/18 1512    Visit Number  3    Date for PT Re-Evaluation  08/13/18    PT Start Time  0102    PT Stop Time  0200    PT Time Calculation (min)  58 min    Activity Tolerance  Patient tolerated treatment well    Behavior During Therapy  [facility] for tasks assessed/performed       Past Medical History:  Diagnosis Date  . Allergies   . Allergy   . Migraine     Past Surgical History:  Procedure Laterality Date  . TONSILLECTOMY AND ADENOIDECTOMY    . TYMPANOSTOMY TUBE PLACEMENT      There were no vitals filed for this visit.  Subjective Assessment - 07/30/18 1507    Subjective  Knee felt good over the weekend.  Kinda hurting today.    Currently in Pain?  Yes    Pain Score  5     Pain Location  Knee    Pain Orientation  Right    Pain Descriptors / Indicators  Aching;Throbbing;Dull    Pain Onset  More than a month ago          PLEASE SEE FLOW SHEET FOR TREATMENT.  Bike (level 3) x 15 minutes, 10# knee extension x 3 minutes 40# ham curls x 3 minutes and leg press 2 1/2 plates x 3 minutes.  HMP and IFC to right knee x 20 minutes.                        PT Long Term Goals - 07/16/18 1542      PT LONG TERM GOAL #1   Title  Independent with a HEP/gym program.    Time  4    Period  Weeks    Status  New            Plan - 07/30/18 1510    Clinical Impression Statement  Patient did well with the addition of leg press today and increased hamstring curls to 40#.  Her right patellar mobs have improved.    PT Treatment/Interventions  Cryotherapy;Electrical Stimulation;Moist Heat;Therapeutic exercise;Therapeutic  activities;Patient/family education;Manual techniques;Passive range of motion;Vasopneumatic Device    PT Next Visit Plan  Ellipitcal.     Consulted and Agree with Plan of Care  Patient       Patient will benefit from skilled therapeutic intervention in order to improve the following deficits and impairments:  Pain, Decreased activity tolerance, Decreased strength  Visit Diagnosis: Chronic pain of right knee  Muscle weakness (generalized)     Problem List Patient Active Problem List   Diagnosis Date Noted  . Cervical radiculitis 12/29/2015  . Lumbar radiculopathy 12/29/2015  . Seasonal allergies 07/04/2011  . Irregular menses 07/04/2011  . Headache(784.0) 07/04/2011    Marlana Mckowen, [place] MPT 07/30/2018, 3:14 PM  [facility] [address] [place], [place], [address] Phone: [phone]   Fax:  [phone]  Name: FANIE GSELL MRN: [id number] Date of Birth: 1996-04-21

## 2018-08-04 ENCOUNTER — Ambulatory Visit: Payer: BLUE CROSS/BLUE SHIELD | Admitting: Physical Therapy

## 2018-08-04 DIAGNOSIS — G8929 Other chronic pain: Secondary | ICD-10-CM

## 2018-08-04 DIAGNOSIS — M25561 Pain in right knee: Principal | ICD-10-CM

## 2018-08-04 DIAGNOSIS — M6281 Muscle weakness (generalized): Secondary | ICD-10-CM

## 2018-08-04 NOTE — Therapy (Addendum)
[facility] [place], [place], [address] Phone: [phone]   Fax:  [phone]  Physical Therapy Treatment  Patient Details  Name: TYCHELLE PURKEY MRN: [id number] Date of Birth: 1996/04/26 Referring Provider (PT): Lynne Leader MD   Encounter Date: 08/04/2018  PT End of Session - 08/04/18 1411    Visit Number  4    Number of Visits  8    Date for PT Re-Evaluation  08/13/18    PT Start Time  0100    PT Stop Time  0157    PT Time Calculation (min)  57 min    Activity Tolerance  Patient tolerated treatment well    Behavior During Therapy  [facility] for tasks assessed/performed       Past Medical History:  Diagnosis Date  . Allergies   . Allergy   . Migraine     Past Surgical History:  Procedure Laterality Date  . TONSILLECTOMY AND ADENOIDECTOMY    . TYMPANOSTOMY TUBE PLACEMENT      There were no vitals filed for this visit.  Subjective Assessment - 08/04/18 1412    Subjective  Knee pain at 5 over 10.  I had to lift an 80# dog at work today.    Limitations  Standing    How long can you stand comfortably?  15 minutes.    Currently in Pain?  Yes    Pain Score  5     Pain Location  Knee    Pain Orientation  Right    Pain Descriptors / Indicators  Aching;Throbbing;Dull    Pain Onset  More than a month ago                       [facility] Adult PT Treatment/Exercise - 08/04/18 0001      Exercises   Exercises  Knee/Hip      Knee/Hip Exercises: Aerobic   Elliptical  L1/L1 x 10 minutes.    Recumbent Bike  Level 3 x 10 minutes.      Modalities   Modalities  Health visitor Stimulation Location  Right knee.    Electrical Stimulation Action  IFC    Electrical Stimulation Parameters  80-150 Hz x 20 minutes.    Electrical Stimulation Goals  Pain      Vasopneumatic   Number Minutes Vasopneumatic   20 minutes    Vasopnuematic Location   --    Right knee.   Vasopneumatic Pressure  Medium      Manual Therapy   Manual Therapy  Passive ROM    Passive ROM  Sustained 3 minutes right hamstring stretch.                  PT Long Term Goals - 07/16/18 1542      PT LONG TERM GOAL #1   Title  Independent with a HEP/gym program.    Time  4    Period  Weeks    Status  New            Plan - 08/04/18 1430    Clinical Impression Statement  Patient has had a strenuous day at work thus far having to lift an 80# dog.  However, she was able to perform ellipitical today without pain increase.    PT Treatment/Interventions  Cryotherapy;Electrical Stimulation;Moist Heat;Therapeutic exercise;Therapeutic activities;Patient/family education;Manual techniques;Passive range of motion;Vasopneumatic Device    PT Next Visit Plan  Ellipitcal.     Consulted and Agree with Plan of Care  Patient       Patient will benefit from skilled therapeutic intervention in order to improve the following deficits and impairments:  Pain, Decreased activity tolerance, Decreased strength  Visit Diagnosis: Chronic pain of right knee  Muscle weakness (generalized)     Problem List Patient Active Problem List   Diagnosis Date Noted  . Cervical radiculitis 12/29/2015  . Lumbar radiculopathy 12/29/2015  . Seasonal allergies 07/04/2011  . Irregular menses 07/04/2011  . Headache(784.0) 07/04/2011    APPLEGATE, [place]i MPT 08/04/2018, 2:39 PM  [facility] [address] [place], [place], [address] Phone: [phone]   Fax:  [phone]  Name: ELANIA CROWL MRN: [id number] Date of Birth: [birth year]  PHYSICAL THERAPY DISCHARGE SUMMARY  Visits from Start of Care: 4.  Current functional level related to goals / functional outcomes: See above.   Remaining deficits: See below.   Education / Equipment: HEP. Plan: Patient agrees to discharge.  Patient goals were not met. Patient is being  discharged due to not returning since the last visit.  ?????          Mali Applegate MPT

## 2018-08-06 ENCOUNTER — Ambulatory Visit (INDEPENDENT_AMBULATORY_CARE_PROVIDER_SITE_OTHER): Payer: BLUE CROSS/BLUE SHIELD | Admitting: Family Medicine

## 2018-08-06 ENCOUNTER — Encounter: Payer: Self-pay | Admitting: Family Medicine

## 2018-08-06 VITALS — BP 124/70 | HR 88 | Temp 98.9°F | Wt 207.0 lb

## 2018-08-06 DIAGNOSIS — H5712 Ocular pain, left eye: Secondary | ICD-10-CM

## 2018-08-06 DIAGNOSIS — H532 Diplopia: Secondary | ICD-10-CM

## 2018-08-06 NOTE — Patient Instructions (Signed)
Chronic medical record was unavailable during the time of this encounter.  Patient instructions were handwritten and provided to patient.

## 2018-08-06 NOTE — Progress Notes (Signed)
Ashlee Newton is a 23 y.o. female who presents to [facility]: Primary Care Sports Medicine today for diplopia and left eye pain triggered by extraocular movement to extremes of lateral gaze. Pain is not episodic and can be triggered at any time. Patient reports occasional blurrines and diplopia.  She denies significant blurry vision or decreased visual acuity.  Symptoms frequent since Sunday 08/02/2018, symptoms occurred infrequently before that. Patient unsure of time of initial onset of symptoms. Patient rated pain at 7/10.  Patient denies vision loss, itchiness, and irritation.   ROS as above:  Exam:  BP 124/70    Pulse 88    Temp 98.9 F (37.2 C) (Oral)    Wt 207 lb (93.9 kg)    BMI 35.53 kg/m  Wt Readings from Last 5 Encounters:  08/06/18 207 lb (93.9 kg)  07/19/18 211 lb (95.7 kg)  07/13/18 210 lb (95.3 kg)  07/09/18 209 lb (94.8 kg)  05/22/18 205 lb (93 kg)    Gen: Well NAD HEENT: EOMI bilaterally with no obvious gaze discrepancy with range of motion., PERRLA normal conjunctiva, red reflexes present BL, Optic disks appear normal BL, MMM  Lungs: Normal work of breathing. CTABL Heart: RRR no MRG Abd: NABS, Soft. Nondistended, Nontender Exts: Brisk capillary refill, warm and well perfused.  Neuro: Negative Romberg   Lab and Radiology Results No results found for this or any previous visit (from the past 72 hour(s)). No results found.    Assessment and Plan: 23 y.o. female with diplopia and left eye pain triggered by extraocular movement to extremes of lateral gaze. Normal PE and lack of itchiness make conjunctivitis or infectious cause unlikely. Current differential includes extraocular muscle inflammation, tendonitis, or spasm. Optic neuritis seems less likely due to lack of vision loss but is still on the differential. Referred to ophthalmology. Ordered Orbital MRI with or without  contrast.  Gust with radiology.  Patient is allergic to IV contrast for CT scan.  Is not allergy her best test would be MRI orbit with and without MRI contrast.  PDMP not reviewed this encounter. Orders Placed This Encounter  Procedures   MR ORBITS W WO CONTRAST    Allergic to CT contrast. Talked with radiology recommend MRI with and without.    Standing Status:   Future    Standing Expiration Date:   10/06/2019    Order Specific Question:   GRA to provide read?    Answer:   Yes    Order Specific Question:   If indicated for the ordered procedure, I authorize the administration of contrast media per Radiology protocol    Answer:   Yes    Order Specific Question:   What is the patient's sedation requirement?    Answer:   No Sedation    Order Specific Question:   Does the patient have a pacemaker or implanted devices?    Answer:   No    Order Specific Question:   Preferred imaging location?    Answer:   [facility] (table limit-350lbs)    Order Specific Question:   Radiology Contrast Protocol - do NOT remove file path    Answer:   [email or web address]   Ambulatory referral to Ophthalmology    Referral Priority:   Routine    Referral Type:   Consultation    Referral Reason:   Specialty Services Required    Requested Specialty:   Ophthalmology    Number of Visits Requested:  1   No orders of the defined types were placed in this encounter.    Historical information moved to improve visibility of documentation.  Past Medical History:  Diagnosis Date   Allergies    Allergy    Migraine    Past Surgical History:  Procedure Laterality Date   TONSILLECTOMY AND ADENOIDECTOMY     TYMPANOSTOMY TUBE PLACEMENT     Social History   Tobacco Use   Smoking status: Never Smoker   Smokeless tobacco: Never Used  Substance Use Topics   Alcohol use: Yes   family history includes Cancer in her maternal grandfather; Hypercholesterolemia in her  mother; Hypertension in her father; Migraines in her mother.  Medications: Current Outpatient Medications  Medication Sig Dispense Refill   diclofenac sodium (VOLTAREN) 1 % GEL Apply 4 g topically 4 (four) times daily. To affected joint. 100 g 11   drospirenone-ethinyl estradiol (YAZ,GIANVI,LORYNA) 3-0.02 MG tablet Take 1 tablet by mouth daily. 1 Package 11   ipratropium (ATROVENT) 0.06 % nasal spray Place 2 sprays into both nostrils every 4 (four) hours as needed. 10 mL 6   levocetirizine (XYZAL) 5 MG tablet Take by mouth.     montelukast (SINGULAIR) 10 MG tablet Take by mouth.     omeprazole (PRILOSEC) 40 MG capsule Take 1 capsule (40 mg total) by mouth daily. 30 capsule 3   ondansetron (ZOFRAN ODT) 4 MG disintegrating tablet Take 1 tablet (4 mg total) by mouth every 8 (eight) hours as needed for nausea or vomiting. 12 tablet 0   SUMAtriptan (IMITREX) 50 MG tablet Take 1 tablet (50 mg total) by mouth every 2 (two) hours as needed for migraine. May repeat in 2 hours if headache persists or recurs. 12 tablet 0   triamcinolone cream (KENALOG) 0.1 % Apply 1 application topically 2 (two) times daily. 30 g 0   No current facility-administered medications for this visit.    Allergies  Allergen Reactions   Contrast Media [Iodinated Diagnostic Agents] Rash     Discussed warning signs or symptoms. Please see discharge instructions. Patient expresses understanding.  I personally was present and performed or re-performed the history, physical exam and medical decision-making activities of this service and have verified that the service and findings are accurately documented in the student's note. ___________________________________________ Clementeen Graham M.D., ABFM., CAQSM. Primary Care and Sports Medicine Adjunct Instructor of Family Medicine  [employer]

## 2018-08-13 ENCOUNTER — Ambulatory Visit: Payer: BLUE CROSS/BLUE SHIELD | Admitting: Physical Therapy

## 2018-08-16 ENCOUNTER — Telehealth (INDEPENDENT_AMBULATORY_CARE_PROVIDER_SITE_OTHER): Payer: BLUE CROSS/BLUE SHIELD | Admitting: Family Medicine

## 2018-08-16 DIAGNOSIS — H532 Diplopia: Secondary | ICD-10-CM

## 2018-08-16 NOTE — Telephone Encounter (Signed)
MRI was cancelled. Do you want to reorder this as an ASAP exam? Routing.  

## 2018-08-17 ENCOUNTER — Other Ambulatory Visit: Payer: BLUE CROSS/BLUE SHIELD

## 2018-08-17 ENCOUNTER — Telehealth: Payer: Self-pay | Admitting: Family Medicine

## 2018-08-17 DIAGNOSIS — H532 Diplopia: Secondary | ICD-10-CM

## 2018-08-17 NOTE — Telephone Encounter (Signed)
Called patient and left a message.  If symptoms are worsening we will proceed with stat or ASAP MRI.  If symptoms are stable try to wait.  Additionally asked patient to sign up for my chart. Additionally asked patient to call back with results.  Total time spent 5 minutes.

## 2018-08-17 NOTE — Telephone Encounter (Signed)
Based on what you said I think we should do that MRI.  You should hear about scheduling soon.

## 2018-08-17 NOTE — Telephone Encounter (Signed)
Pt called wanting to update Dr. Denyse Amass. Pt states the pain in her eye hasn't really changed, but feels like her vision is getting some what blurry. Will forward to provider for review.

## 2018-08-18 NOTE — Telephone Encounter (Signed)
See separate note- order has been updated.

## 2018-08-18 NOTE — Telephone Encounter (Signed)
Pt called and aware of providers recommendations.

## 2018-08-18 NOTE — Addendum Note (Signed)
Addended by: Mallie Snooks R on: 08/18/2018 10:03 AM   Modules accepted: Orders

## 2018-08-18 NOTE — Telephone Encounter (Signed)
Order updated

## 2018-08-24 ENCOUNTER — Other Ambulatory Visit: Payer: Self-pay

## 2018-08-24 ENCOUNTER — Ambulatory Visit (INDEPENDENT_AMBULATORY_CARE_PROVIDER_SITE_OTHER): Payer: BLUE CROSS/BLUE SHIELD

## 2018-08-24 DIAGNOSIS — H538 Other visual disturbances: Secondary | ICD-10-CM

## 2018-08-24 DIAGNOSIS — H532 Diplopia: Secondary | ICD-10-CM | POA: Diagnosis not present

## 2018-08-24 IMAGING — MR MRI ORBITS WITHOUT AND WITH CONTRAST
7 series · 43 of 48 positions shown · IV contrast (multihance)
Comparison: None.

CLINICAL DATA: Diplopia. Blurry vision. Pain behind the left eye.
Symptoms since [DATE].

EXAM:
MRI OF THE ORBITS WITHOUT AND WITH CONTRAST
TECHNIQUE: Multiplanar, multisequence MR imaging of the orbits was performed
both before and after the administration of intravenous contrast.
CONTRAST:  19mL MULTIHANCE GADOBENATE DIMEGLUMINE 529 MG/ML IV SOLN

[Series 2: T1 · sagittal · 5.0mm · 0.90mm/px · 6 of 25 slices shown (1 of 3)]
[im 1/25]
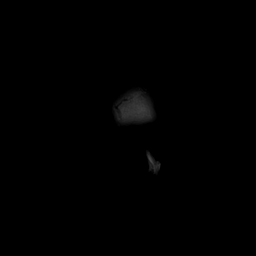
[im 5/25]
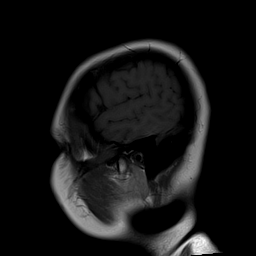
[im 10/25]
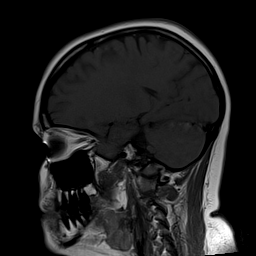
[im 15/25]
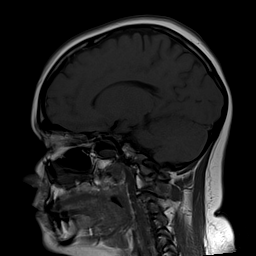
[im 20/25]
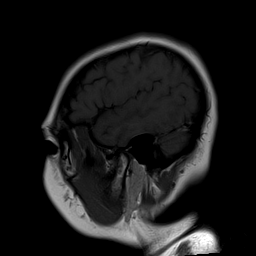
[im 25/25]
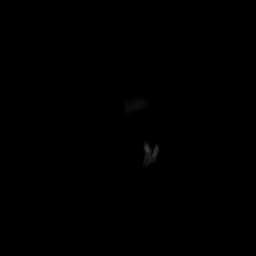

[Series 3: T2 fat-sat · axial · 3.0mm · 0.70mm/px · z∈[-37,+22]mm · 5 of 21 slices shown (1 of 2)]
[im 1/21]
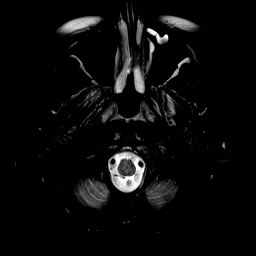
[im 6/21]
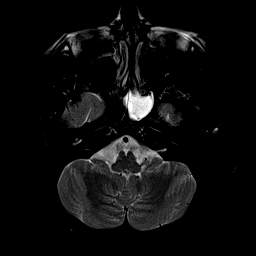
[im 11/21]
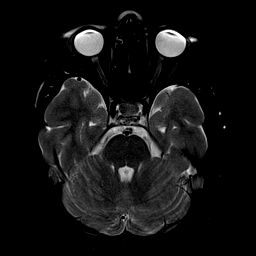
[im 16/21]
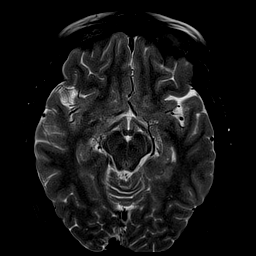
[im 21/21]
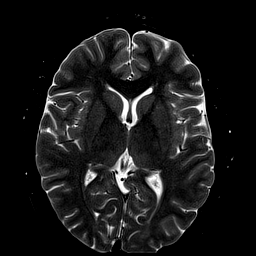

[Series 4: T2 fat-sat · coronal · 3.0mm · 0.35mm/px · 9 of 35 slices shown (2 of 2)]
[im 1/35]
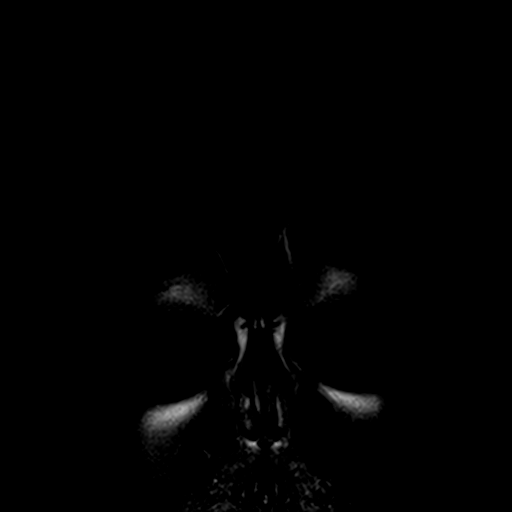
[im 5/35]
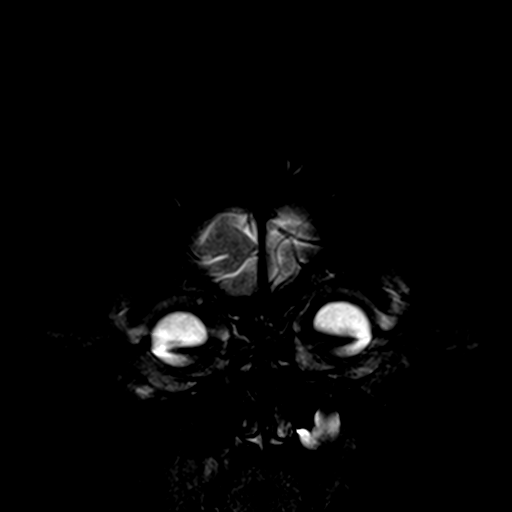
[im 9/35]
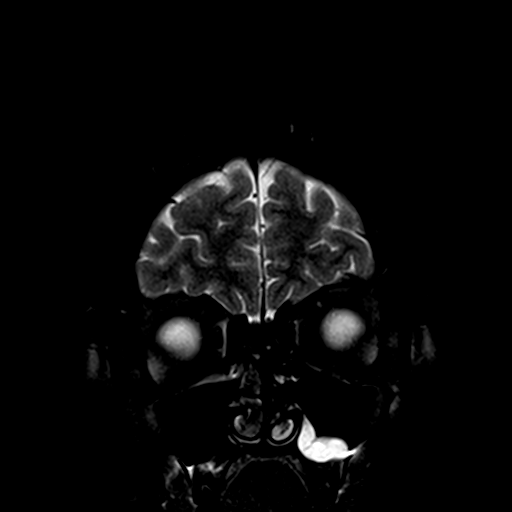
[im 13/35]
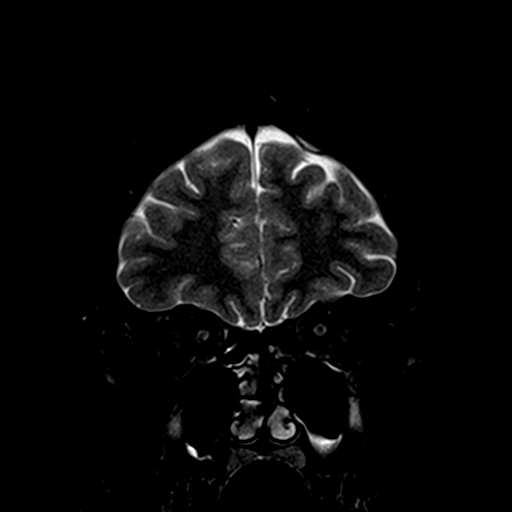
[im 18/35]
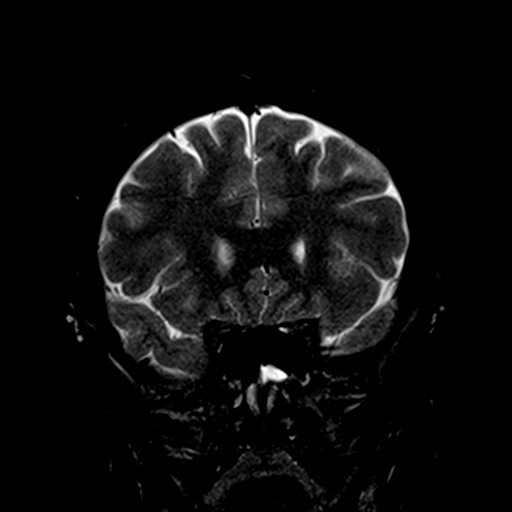
[im 22/35]
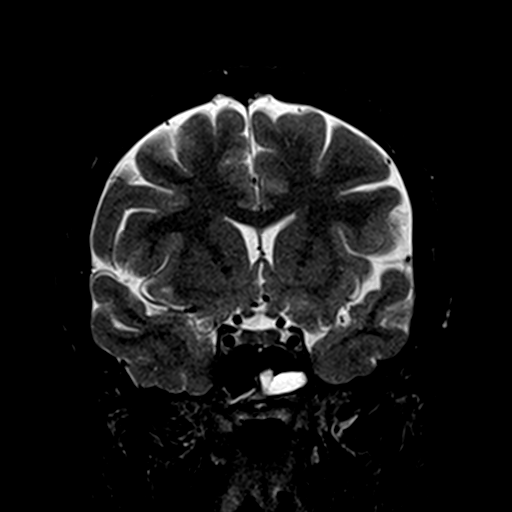
[im 26/35]
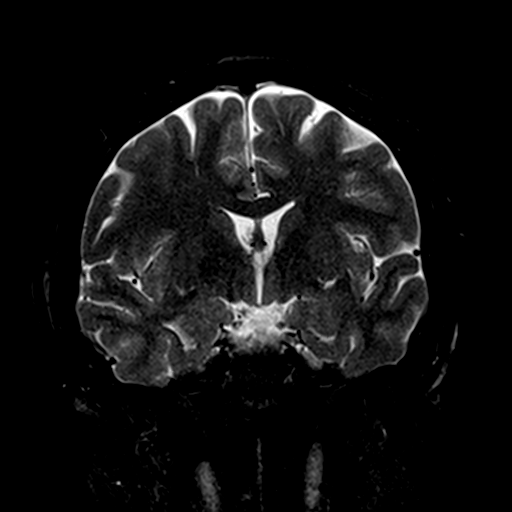
[im 30/35]
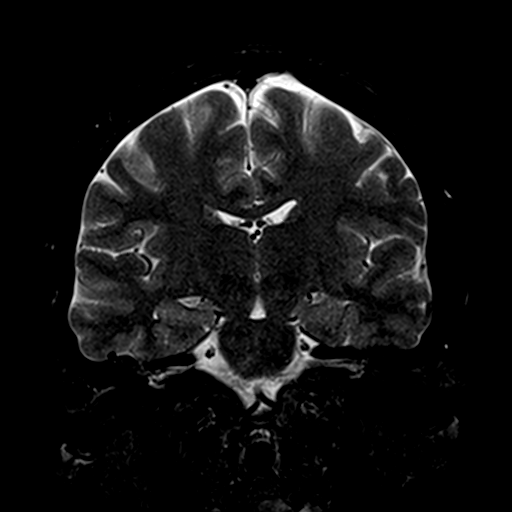
[im 35/35]
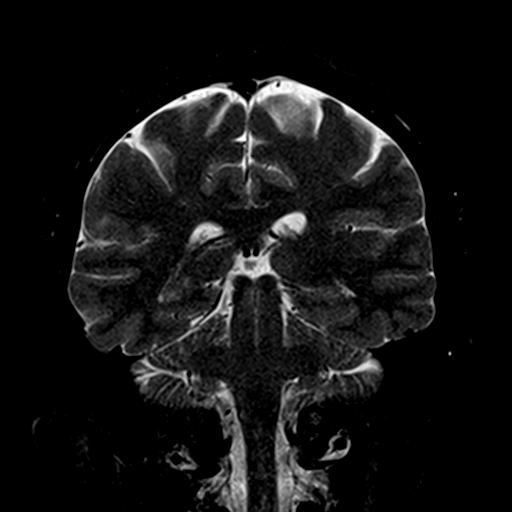

[Series 5: T1 · axial · non-contrast · 3.0mm · 0.70mm/px · z∈[-37,+22]mm · 5 of 21 slices shown (2 of 3)]
[im 1/21]
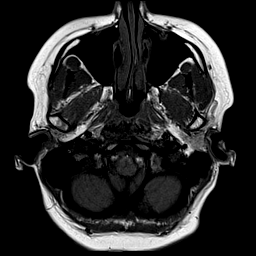
[im 6/21]
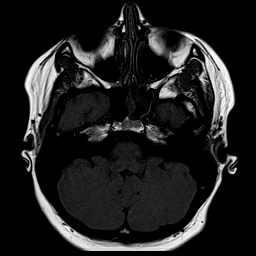
[im 11/21]
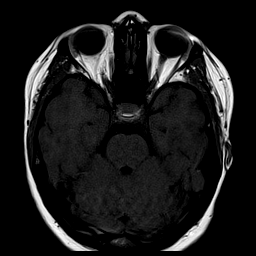
[im 16/21]
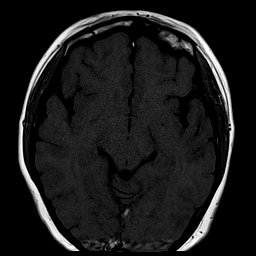
[im 21/21]
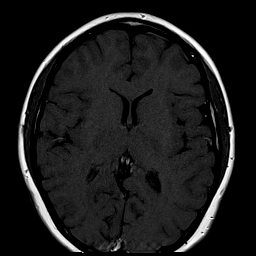

[Series 6: T1 · coronal · non-contrast · 3.0mm · 0.70mm/px · 8 of 35 slices shown (3 of 3)]
[im 1/35]
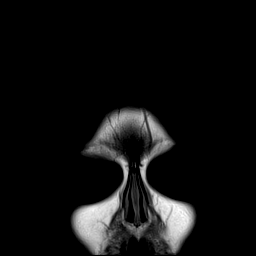
[im 5/35]
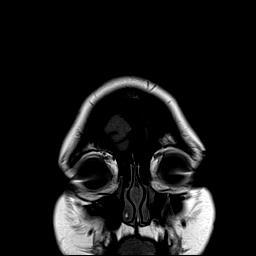
[im 9/35]
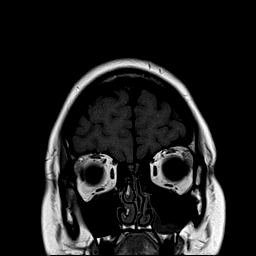
[im 13/35]
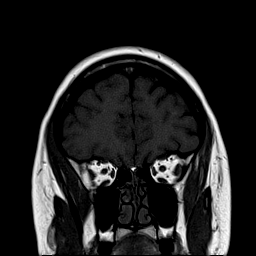
[im 22/35]
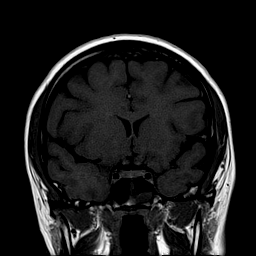
[im 26/35]
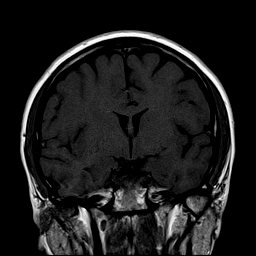
[im 30/35]
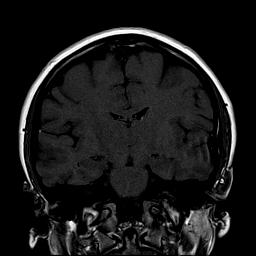
[im 35/35]
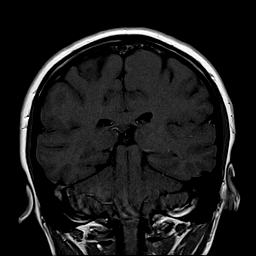

[Series 7: T1 fat-sat post-contrast · axial · 3.0mm · 0.70mm/px · z∈[-37,+22]mm · 5 of 21 slices shown (1 of 2)]
[im 1/21]
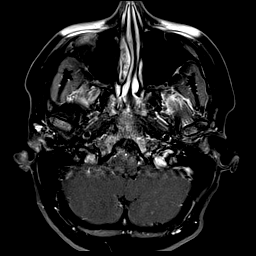
[im 6/21]
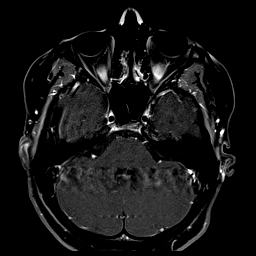
[im 11/21]
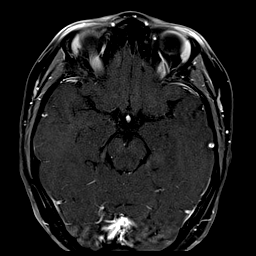
[im 16/21]
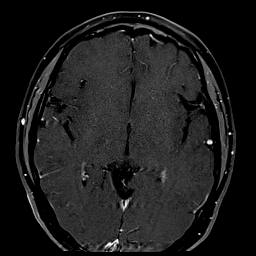
[im 21/21]
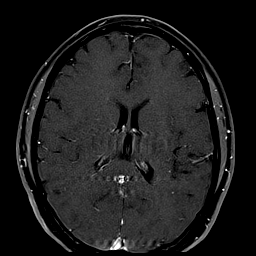

[Series 8: T1 fat-sat post-contrast · coronal · 3.0mm · 0.70mm/px · 5 of 35 slices shown (2 of 2)]
[im 1/35]
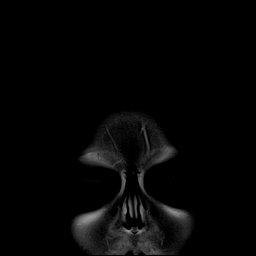
[im 5/35]
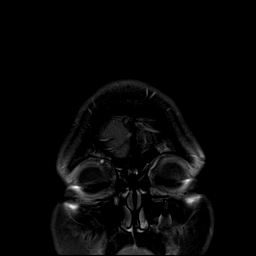
[im 9/35]
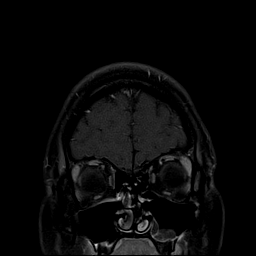
[im 13/35]
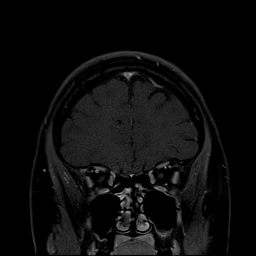
[im 22/35]
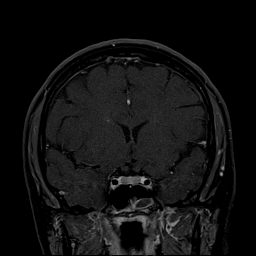

[43 of 48 positions shown; findings below may reference images not displayed]

FINDINGS: Susceptibility artifact from cosmetics limits assessment of the
anterior aspects of the globes and overlying superficial orbital
soft tissues. The optic nerve complexes are symmetric and normal in
appearance without evidence of edema or abnormal enhancement. The
orbital fat is normal without evidence of inflammation. No orbital
mass is identified. The extraocular muscles and lacrimal glands are
unremarkable.

The optic chiasm, cavernous sinuses, and pituitary are unremarkable.
Limited imaging of the brain is unremarkable. There is
mild-to-moderate left sphenoid sinus mucosal thickening. The mastoid
air cells are clear. Major intracranial arterial flow voids are
preserved.
IMPRESSION: Negative orbital imaging.

## 2018-08-24 MED ORDER — GADOBENATE DIMEGLUMINE 529 MG/ML IV SOLN
20.0000 mL | Freq: Once | INTRAVENOUS | Status: AC | PRN
  Administered 2018-08-24: 19 mL via INTRAVENOUS

## 2018-09-11 ENCOUNTER — Encounter: Payer: Self-pay | Admitting: Family Medicine

## 2018-09-22 ENCOUNTER — Encounter: Payer: Self-pay | Admitting: Family Medicine

## 2018-09-22 DIAGNOSIS — Q142 Congenital malformation of optic disc: Secondary | ICD-10-CM | POA: Insufficient documentation

## 2018-09-22 DIAGNOSIS — H5051 Esophoria: Secondary | ICD-10-CM

## 2018-09-22 NOTE — Progress Notes (Signed)
Received notes from Dr. Hardie Shackleton at [facility] surgeons regarding referral. 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  Also some concern for monitoring for papilledema.  Plan for +1 OTC reading glasses and recheck in 1 to 2 months.  If not improving would image with MRV and referral to LP to determine if she has idiopathic intracranial hypertension.  Note will be sent to scan.

## 2018-11-23 ENCOUNTER — Ambulatory Visit: Payer: BLUE CROSS/BLUE SHIELD

## 2018-11-25 ENCOUNTER — Other Ambulatory Visit: Payer: Self-pay

## 2018-11-25 ENCOUNTER — Ambulatory Visit (INDEPENDENT_AMBULATORY_CARE_PROVIDER_SITE_OTHER): Payer: BC Managed Care – PPO | Admitting: *Deleted

## 2018-11-25 DIAGNOSIS — Z23 Encounter for immunization: Secondary | ICD-10-CM | POA: Diagnosis not present

## 2018-11-25 NOTE — Progress Notes (Signed)
Pt here for 3rd Gardasil injection. °

## 2018-12-21 ENCOUNTER — Emergency Department (HOSPITAL_BASED_OUTPATIENT_CLINIC_OR_DEPARTMENT_OTHER)
Admission: EM | Admit: 2018-12-21 | Discharge: 2018-12-21 | Disposition: A | Discharge status: Home / Self Care | Payer: BC Managed Care – PPO | Attending: Emergency Medicine | Admitting: Emergency Medicine

## 2018-12-21 ENCOUNTER — Encounter (HOSPITAL_BASED_OUTPATIENT_CLINIC_OR_DEPARTMENT_OTHER): Payer: Self-pay | Admitting: *Deleted

## 2018-12-21 ENCOUNTER — Other Ambulatory Visit: Payer: Self-pay

## 2018-12-21 ENCOUNTER — Emergency Department (HOSPITAL_BASED_OUTPATIENT_CLINIC_OR_DEPARTMENT_OTHER): Payer: BC Managed Care – PPO

## 2018-12-21 DIAGNOSIS — Z87891 Personal history of nicotine dependence: Secondary | ICD-10-CM | POA: Diagnosis not present

## 2018-12-21 DIAGNOSIS — Y92008 Other place in unspecified non-institutional (private) residence as the place of occurrence of the external cause: Secondary | ICD-10-CM | POA: Insufficient documentation

## 2018-12-21 DIAGNOSIS — X509XXA Other and unspecified overexertion or strenuous movements or postures, initial encounter: Secondary | ICD-10-CM | POA: Diagnosis not present

## 2018-12-21 DIAGNOSIS — Y9389 Activity, other specified: Secondary | ICD-10-CM | POA: Diagnosis not present

## 2018-12-21 DIAGNOSIS — S4991XA Unspecified injury of right shoulder and upper arm, initial encounter: Secondary | ICD-10-CM | POA: Diagnosis present

## 2018-12-21 DIAGNOSIS — Y999 Unspecified external cause status: Secondary | ICD-10-CM | POA: Diagnosis not present

## 2018-12-21 IMAGING — DX RIGHT SHOULDER - 2+ VIEW
3 series · 3 of 3 positions shown · non-contrast
Comparison: Plain film of the RIGHT shoulder dated [DATE].

CLINICAL DATA: RIGHT shoulder pain. History of shoulder
dislocation.

EXAM:
RIGHT SHOULDER - 2+ VIEW

[shoulder grashey]
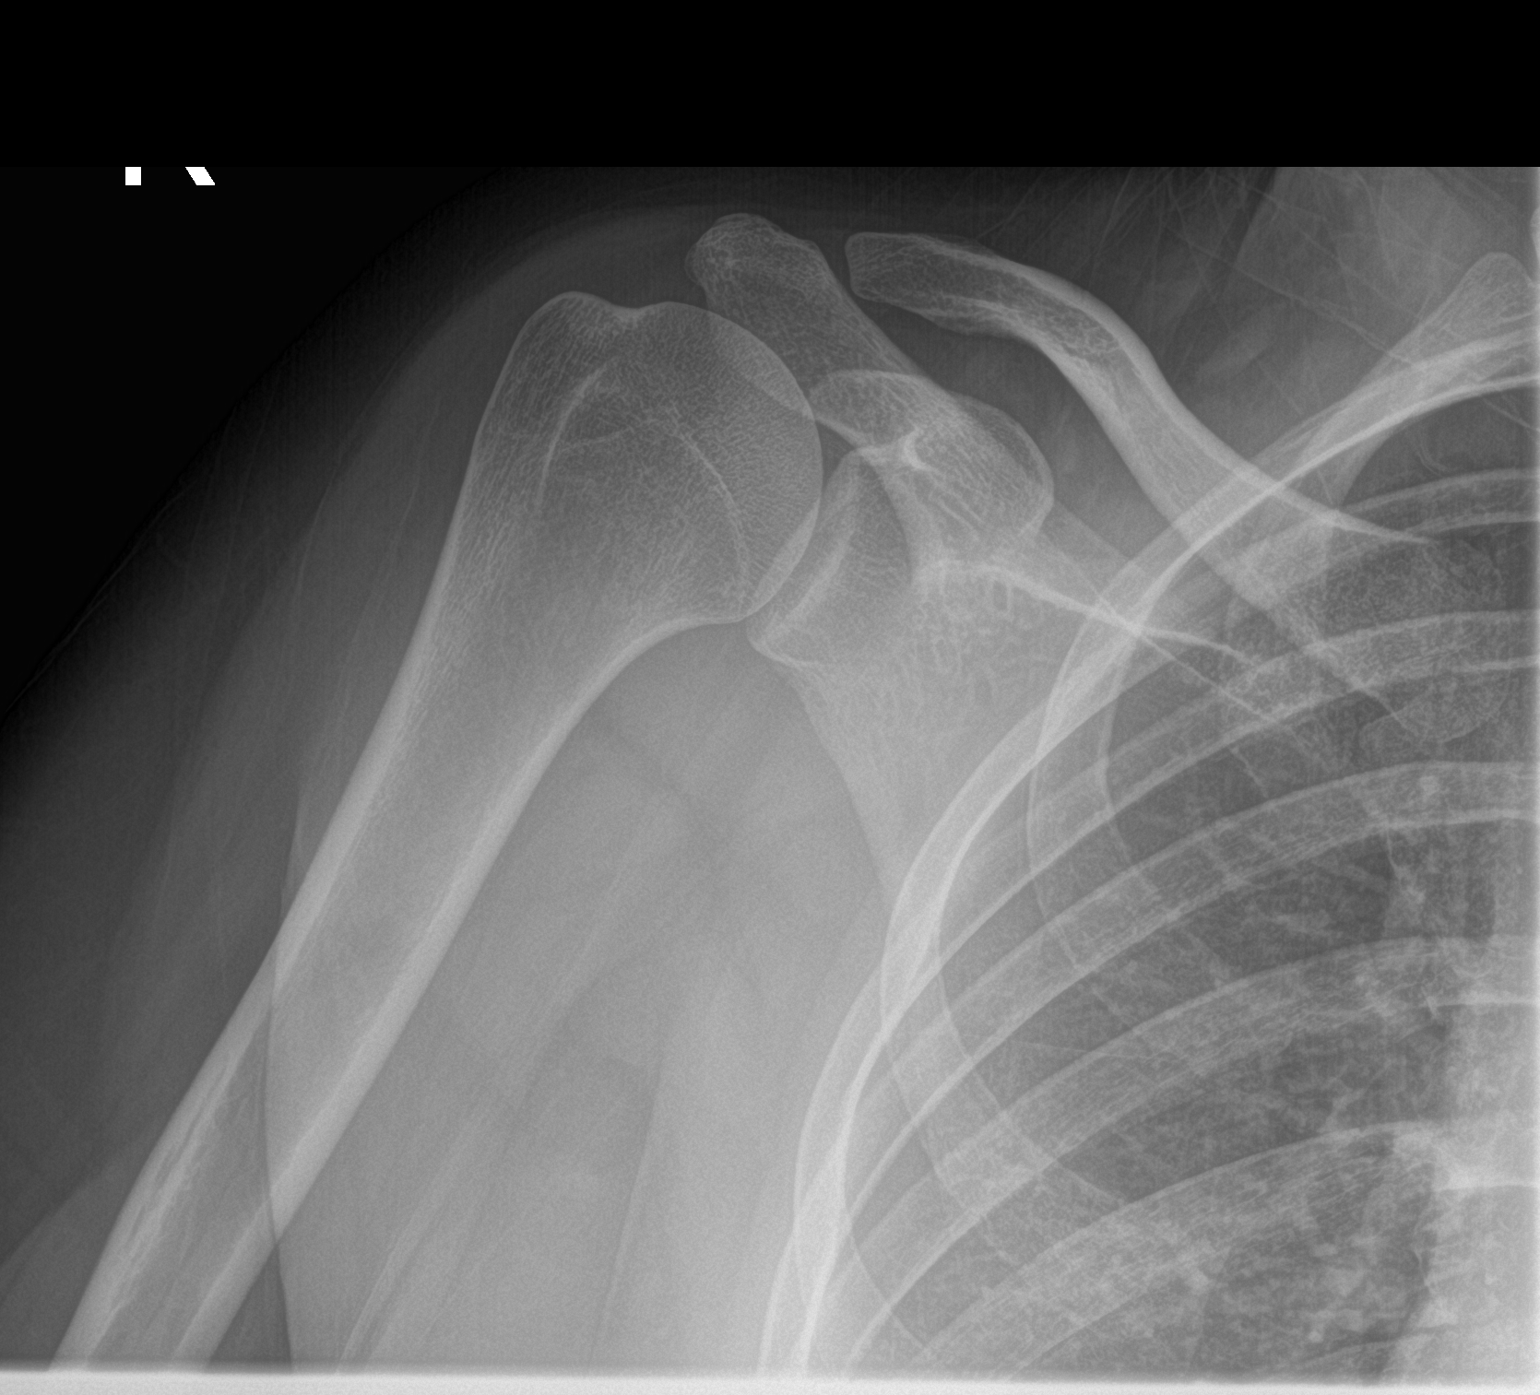

[shoulder axillary]
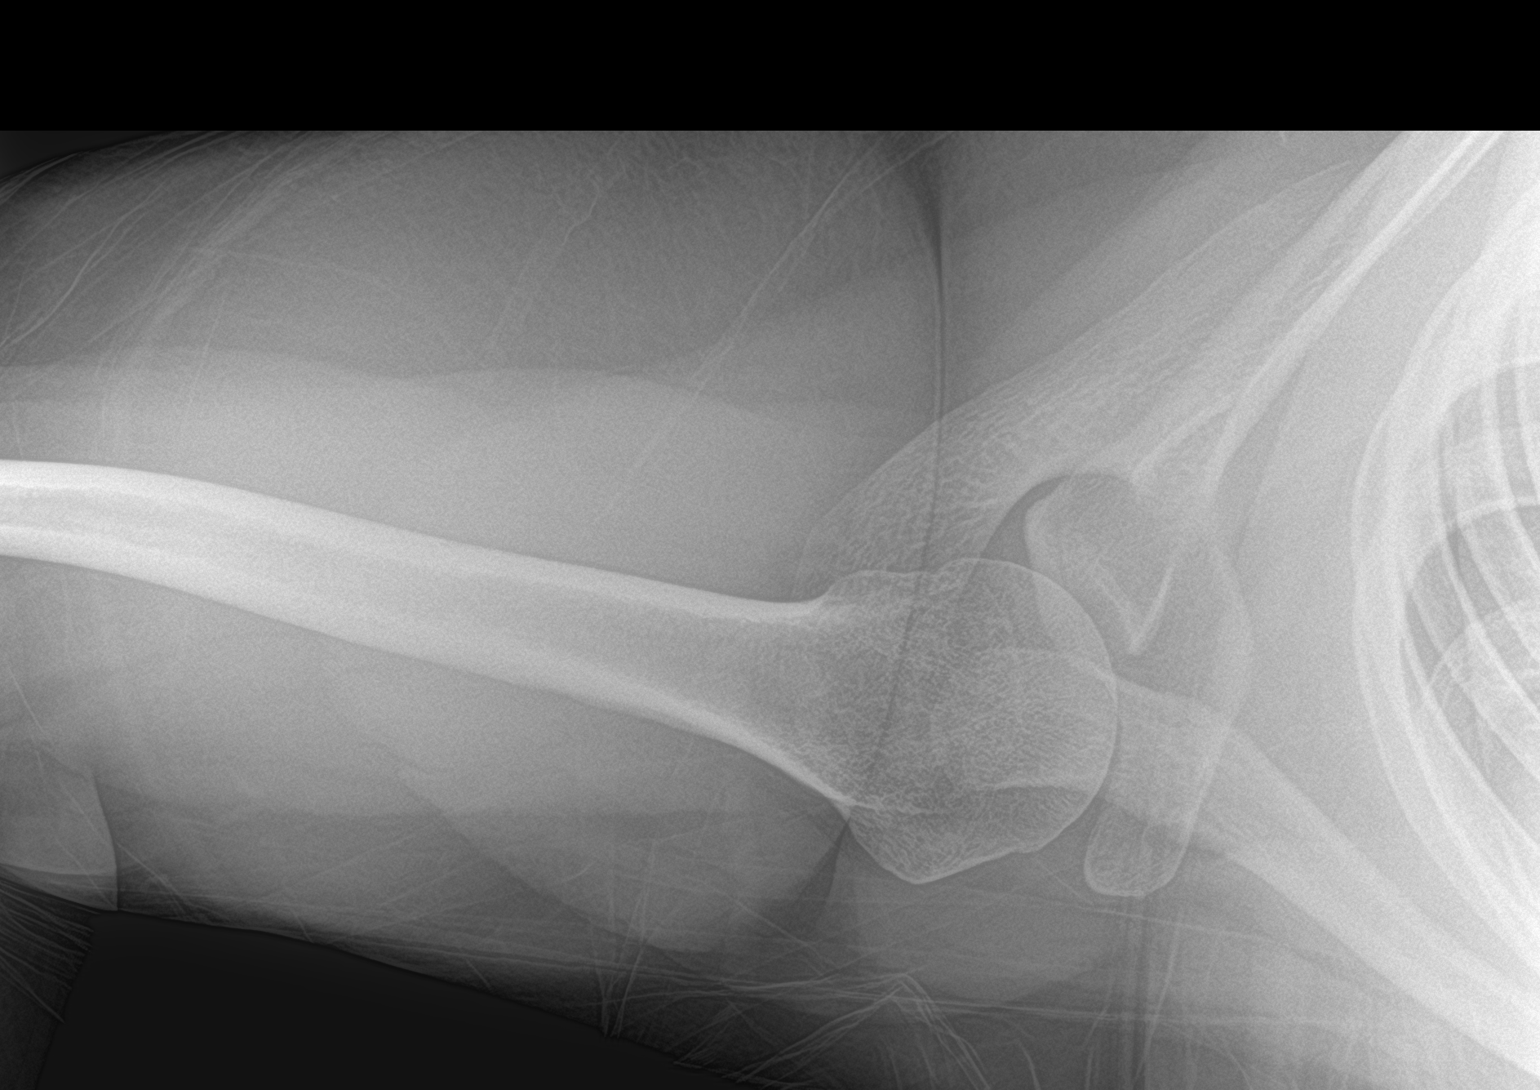

[shoulder y view]
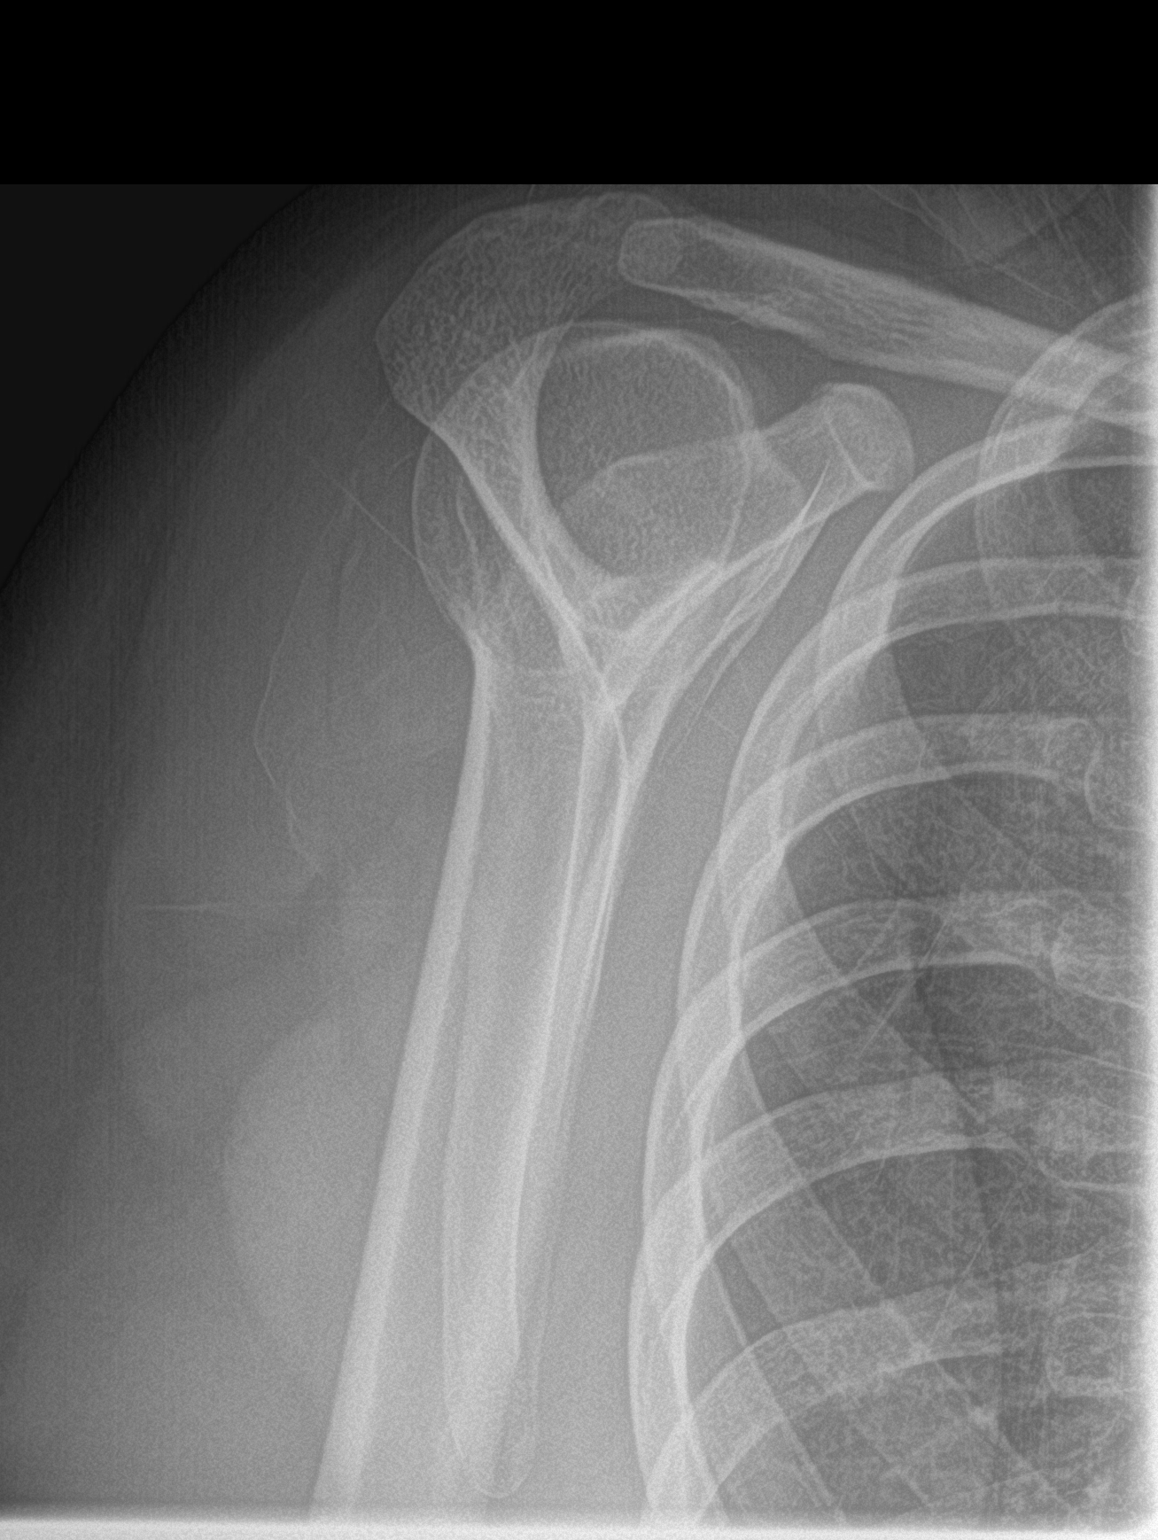

[3 of 3 positions shown; findings below may reference images not displayed]

FINDINGS: Osseous alignment is normal. No fracture line or displaced fracture
fragment seen. Soft tissues about the RIGHT shoulder are
unremarkable.
IMPRESSION: Negative.  No osseous fracture or dislocation seen.

## 2018-12-21 MED ORDER — NAPROXEN 500 MG PO TABS: 500.0000 mg | ORAL_TABLET | Freq: Two times a day (BID) | ORAL | 0 refills | Status: DC

## 2018-12-21 MED ORDER — IBUPROFEN 800 MG PO TABS
800.0000 mg | ORAL_TABLET | Freq: Once | ORAL | Status: AC
  Administered 2018-12-21: 800 mg via ORAL
  Filled 2018-12-21: qty 1

## 2018-12-21 NOTE — ED Triage Notes (Signed)
She did a "high 5" to her sister and thinks she dislocated her right shoulder.

## 2018-12-21 NOTE — Discharge Instructions (Signed)
Take the Naprosyn as directed.  Prescription provided.  Use the sling at all times.  Make an appointment to follow-up with sports medicine.  X-ray showed no dislocation or bony injury.  Injury suggestive of perhaps a rotator cuff injury.  Work note provided.

## 2018-12-21 NOTE — ED Provider Notes (Signed)
[facility] EMERGENCY DEPARTMENT Provider Note   CSN: [id number] Arrival date & time: 12/21/18  2008     History   Chief Complaint Chief Complaint  Patient presents with  . [id number] Injury    HPI Ashlee Newton is a 23 y.o. female.     Patient presenting with concerned that she dislocated her right shoulder.  Patient was wrestling with her dog earlier and then tonight she raised her arm suddenly and got a pop and severe pain it was a little stiff prior to that.  No other injuries.  No prior problems with the shoulder.     Past Medical History:  Diagnosis Date  . Allergies   . Allergy   . Migraine     Patient Active Problem List   Diagnosis Date Noted  . 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 Cervical radiculitis 12/29/2015  . Lumbar radiculopathy 12/29/2015  . Seasonal allergies 07/04/2011  . Irregular menses 07/04/2011  . Headache(784.0) 07/04/2011    Past Surgical History:  Procedure Laterality Date  . TONSILLECTOMY AND ADENOIDECTOMY    . TYMPANOSTOMY TUBE PLACEMENT       OB History    Gravida  0   Para  0   Term  0   Preterm  0   AB  0   Living  0     SAB  0   TAB  0   Ectopic  0   Multiple  0   Live Births  0            Home Medications    Prior to Admission medications   Medication Sig Start Date End Date Taking? Authorizing Provider  diclofenac sodium (VOLTAREN) 1 % GEL Apply 4 g topically 4 (four) times daily. To affected joint. 07/09/18   Rodolph Bongorey, Evan S, MD  drospirenone-ethinyl estradiol Pierre Bali(YAZ,GIANVI,LORYNA) 3-0.02 MG tablet Take 1 tablet by mouth daily. 06/23/18   Donette LarryBhambri, Melanie, CNM  ipratropium (ATROVENT) 0.06 % nasal spray Place 2 sprays into both nostrils every 4 (four) hours as needed. 07/09/18   Rodolph Bongorey, Evan S, MD  levocetirizine (XYZAL) 5 MG tablet Take by mouth.    [provider]  montelukast (SINGULAIR) 10 MG tablet Take by mouth.    [provider]  naproxen  (NAPROSYN) 500 MG tablet Take 1 tablet (500 mg total) by mouth 2 (two) times daily. 12/21/18   Vanetta MuldersZackowski, Alyannah Sanks, MD  omeprazole (PRILOSEC) 40 MG capsule Take 1 capsule (40 mg total) by mouth daily. 07/09/18   Rodolph Bongorey, Evan S, MD  ondansetron (ZOFRAN ODT) 4 MG disintegrating tablet Take 1 tablet (4 mg total) by mouth every 8 (eight) hours as needed for nausea or vomiting. 07/13/18   Lurene ShadowPhelps, Erin O, PA-C  SUMAtriptan (IMITREX) 50 MG tablet Take 1 tablet (50 mg total) by mouth every 2 (two) hours as needed for migraine. May repeat in 2 hours if headache persists or recurs. 07/09/18   Rodolph Bongorey, Evan S, MD  triamcinolone cream (KENALOG) 0.1 % Apply 1 application topically 2 (two) times daily. 05/23/16   Lattie HawBeese, Stephen A, MD    Family History Family History  Problem Relation Age of Onset  . Migraines Mother   . Hypercholesterolemia Mother   . Hypertension Father   . Cancer Maternal Grandfather        pancreatitis --> pancreatic CA    Social History Social History   Tobacco Use  . Smoking status: Never Smoker  . Smokeless tobacco: Never Used  Substance Use Topics  . Alcohol use: Yes  . Drug use: No     Allergies   Contrast media [iodinated diagnostic agents]   Review of Systems Review of Systems  Constitutional: Negative for chills and fever.  HENT: Negative for rhinorrhea and sore throat.   Eyes: Negative for visual disturbance.  Respiratory: Negative for cough and shortness of breath.   Cardiovascular: Negative for chest pain and leg swelling.  Gastrointestinal: Negative for abdominal pain, diarrhea, nausea and vomiting.  Genitourinary: Negative for dysuria.  Musculoskeletal: Positive for joint swelling. Negative for back pain and neck pain.  Skin: Negative for rash.  Neurological: Negative for dizziness, light-headedness and headaches.  Hematological: Does not bruise/bleed easily.  Psychiatric/Behavioral: Negative for confusion.     Physical Exam Updated Vital Signs BP (!)  133/110 (BP Location: Right Arm)   Pulse (!) 128   Temp 98.3 F (36.8 C)   Resp 14   LMP 11/30/2018   SpO2 98%   Physical Exam Vitals signs and nursing note reviewed.  Constitutional:      General: She is not in acute distress.    Appearance: Normal appearance. She is well-developed.  HENT:     Head: Normocephalic and atraumatic.  Eyes:     Conjunctiva/sclera: Conjunctivae normal.     Pupils: Pupils are equal, round, and reactive to light.  Neck:     Musculoskeletal: Normal range of motion and neck supple.  Cardiovascular:     Rate and Rhythm: Normal rate and regular rhythm.     Heart sounds: No murmur.  Pulmonary:     Effort: Pulmonary effort is normal. No respiratory distress.     Breath sounds: Normal breath sounds.  Abdominal:     Palpations: Abdomen is soft.     Tenderness: There is no abdominal tenderness.  Musculoskeletal:        General: Swelling, tenderness and signs of injury present.     Comments: 's swelling to the right shoulder no deformity.  Tenderness to palpation.  No pain with range of motion at wrist and elbow neurovascularly intact to the fingers radial pulses 2+.  But definite pain with any movement of the right shoulder.  Skin:    General: Skin is warm and dry.     Capillary Refill: Capillary refill takes less than 2 seconds.  Neurological:     General: No focal deficit present.     Mental Status: She is alert and oriented to person, place, and time.     Cranial Nerves: No cranial nerve deficit.     Sensory: No sensory deficit.     Motor: No weakness.      ED Treatments / Results  Labs (all labs ordered are listed, but only abnormal results are displayed) Labs Reviewed - No data to display  EKG None  Radiology Dg Shoulder Right  Result Date: 12/21/2018 CLINICAL DATA:  RIGHT shoulder pain. History of shoulder dislocation. EXAM: RIGHT SHOULDER - 2+ VIEW COMPARISON:  Plain film of the RIGHT shoulder dated 12/08/2010. FINDINGS: Osseous  alignment is normal. No fracture line or displaced fracture fragment seen. Soft tissues about the RIGHT shoulder are unremarkable. IMPRESSION: Negative.  No osseous fracture or dislocation seen. Electronically Signed   By: Bary RichardStan  Maynard M.D.   On: 12/21/2018 21:14    Procedures Procedures (including critical care time)  Medications Ordered in ED Medications  ibuprofen (ADVIL) tablet 800 mg (800 mg Oral Given 12/21/18 2216)     Initial Impression / Assessment and Plan /  ED Course  I have reviewed the triage vital signs and the nursing notes.  Pertinent labs & imaging results that were available during my care of the patient were reviewed by me and considered in my medical decision making (see chart for details).        X-rays of the right shoulder show no bony abnormality no dislocation.  Clinically may be rotator cuff injury.  Will treat with a sling follow-up with sports medicine and anti-inflammatory medicines.  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  Final Clinical Impressions(s) / ED Diagnoses   Final diagnoses:  Shoulder injury, right, initial encounter    ED Discharge Orders         Ordered    naproxen (NAPROSYN) 500 MG tablet  2 times daily     12/21/18 2241           Fredia Sorrow, MD 12/21/18 2245

## 2018-12-21 NOTE — ED Notes (Signed)
ED Provider at bedside. 

## 2018-12-22 ENCOUNTER — Ambulatory Visit: Payer: BC Managed Care – PPO | Admitting: Family Medicine

## 2018-12-22 ENCOUNTER — Encounter: Payer: Self-pay | Admitting: Family Medicine

## 2018-12-22 VITALS — Ht 64.0 in | Wt 220.0 lb

## 2018-12-22 DIAGNOSIS — M25511 Pain in right shoulder: Secondary | ICD-10-CM | POA: Insufficient documentation

## 2018-12-22 MED ORDER — IBUPROFEN-FAMOTIDINE 800-26.6 MG PO TABS: 1.0000 | ORAL_TABLET | Freq: Three times a day (TID) | ORAL | 3 refills | Status: DC

## 2018-12-22 NOTE — Progress Notes (Signed)
Ashlee Newton - 23 y.o. female MRN [id number]  Date of birth: Jun 20, 1995  SUBJECTIVE:  Including CC & ROS.  Chief Complaint  Patient presents with  . Shoulder Injury    right shoulder    Ashlee Newton is a 23 y.o. female that is presenting with right shoulder pain.  The pain is occurring over the lateral shoulder.  Reports she was doing a maneuver last night and felt as if the shoulder came out of place.  She was seen in the emergency department and x-rays were reassuring.  The pain is constant and severe.  She has a history of a dislocated shoulder when she was in high school.  Unable to move her shoulder without pain. .  Independent review of the right shoulder x-ray from 7/27 shows no acute abnormalities.   Review of Systems  Constitutional: Negative for fever.  HENT: Negative for congestion.   Respiratory: Negative for cough.   Cardiovascular: Negative for chest pain.  Gastrointestinal: Negative for abdominal pain.  Musculoskeletal: Negative for gait problem.  Skin: Negative for color change.  Neurological: Negative for weakness.  Hematological: Negative for adenopathy.    HISTORY: Past Medical, Surgical, Social, and Family History Reviewed & Updated per EMR.   Pertinent Historical Findings include:  Past Medical History:  Diagnosis Date  . Allergies   . Allergy   . Migraine     Past Surgical History:  Procedure Laterality Date  . TONSILLECTOMY AND ADENOIDECTOMY    . TYMPANOSTOMY TUBE PLACEMENT      Allergies  Allergen Reactions  . Contrast Media [Iodinated Diagnostic Agents] Rash    Family History  Problem Relation Age of Onset  . Migraines Mother   . Hypercholesterolemia Mother   . Hypertension Father   . Cancer Maternal Grandfather        pancreatitis --> pancreatic CA     Social History   Socioeconomic History  . Marital status: Single    Spouse name: Not on file  . Number of children: Not on file  . Years of education: Not on file  . Highest  education level: Not on file  Occupational History  . Occupation: Data processing manager Needs  . Financial resource strain: Not on file  . Food insecurity    Worry: Not on file    Inability: Not on file  . Transportation needs    Medical: Not on file    Non-medical: Not on file  Tobacco Use  . Smoking status: Never Smoker  . Smokeless tobacco: Never Used  Substance and Sexual Activity  . Alcohol use: Yes  . Drug use: No  . Sexual activity: Yes    Partners: Male    Birth control/protection: Pill  Lifestyle  . Physical activity    Days per week: Not on file    Minutes per session: Not on file  . Stress: Not on file  Relationships  . Social Herbalist on phone: Not on file    Gets together: Not on file    Attends religious service: Not on file    Active member of club or organization: Not on file    Attends meetings of clubs or organizations: Not on file    Relationship status: Not on file  . Intimate partner violence    Fear of current or ex partner: Not on file    Emotionally abused: Not on file    Physically abused: Not on file    Forced sexual activity:  Not on file  Other Topics Concern  . Not on file  Social History Narrative  . Not on file     PHYSICAL EXAM:  VS: Ht 5\' 4"  (1.626 m)   Wt 220 lb (99.8 kg)   LMP 11/30/2018   BMI 37.76 kg/m  Physical Exam Gen: NAD, alert, cooperative with exam, well-appearing ENT: normal lips, normal nasal mucosa,  Eye: normal EOM, normal conjunctiva and lids CV:  no edema, +2 pedal pulses   Resp: no accessory muscle use, non-labored,   Skin: no rashes, no areas of induration  Neuro: normal tone, normal sensation to touch Psych:  normal insight, alert and oriented MSK:  Right shoulder: Limited active flexion abduction due to pain. Significant pain with external rotation. Able to touch the left shoulder. Able to achieve abduction at 90 degrees has some pain with empty can testing. Neurovascular intact      ASSESSMENT & PLAN:   Acute pain of right shoulder Pain likely the result of a subluxation.  She has a history of dislocation.  No Bankart lesion or Hill-Sachs seen on the x-ray. -Duexis and sample provided. -Placed in sling. -Counseled on home exercise therapy and supportive care. -Follow-up in 2 weeks.  Would consider physical therapy at that time.  May need further imaging if range of motion is limited.

## 2018-12-22 NOTE — Patient Instructions (Signed)
Nice to meet you Please continue the sling  Please try small range of motion movements.  Please try the duexis  Please use ice  Please send me a message in MyChart with any questions or updates.  Please see me back in 2 weeks.   --Dr. Raeford Razor

## 2018-12-22 NOTE — Progress Notes (Signed)
Medication Samples have been provided to the patient.  Drug name: Duexis       Strength: 800mg  / 26.6mg         Qty: 1 Box  LOT: [id number]  Exp.Date: 03/2020  Dosing instructions: Take 1 tablet by mouth three (3) times a day.  The patient has been instructed regarding the correct time, dose, and frequency of taking this medication, including desired effects and most common side effects.   Sherrie George, MA 3:59 PM 12/22/2018

## 2018-12-22 NOTE — Assessment & Plan Note (Signed)
Pain likely the result of a subluxation.  She has a history of dislocation.  No Bankart lesion or Hill-Sachs seen on the x-ray. -Duexis and sample provided. -Placed in sling. -Counseled on home exercise therapy and supportive care. -Follow-up in 2 weeks.  Would consider physical therapy at that time.  May need further imaging if range of motion is limited.

## 2018-12-24 ENCOUNTER — Encounter: Payer: Self-pay | Admitting: Family Medicine

## 2019-01-08 ENCOUNTER — Encounter: Payer: Self-pay | Admitting: Family Medicine

## 2019-01-08 ENCOUNTER — Ambulatory Visit (INDEPENDENT_AMBULATORY_CARE_PROVIDER_SITE_OTHER): Payer: BC Managed Care – PPO | Admitting: Family Medicine

## 2019-01-08 ENCOUNTER — Other Ambulatory Visit: Payer: Self-pay

## 2019-01-08 VITALS — BP 116/73 | HR 86 | Temp 98.1°F | Wt 219.5 lb

## 2019-01-08 DIAGNOSIS — M25511 Pain in right shoulder: Secondary | ICD-10-CM

## 2019-01-08 NOTE — Patient Instructions (Addendum)
Thank you for coming in today. Keep working shoulder strength and range of motion exercises.  Use medicine for pain as needed.  Tylenol or ibuprofen or aleve.  Ok to continue duoexis.  Ok to use medicine for pain as needed.  Use the sling as needed.   In 4 weeks if not "A lot better" we will likely do MRI arthrogram   Keep me updated.   I will be moving to full time Sports Medicine in [place] starting on November 1st.  You will still be able to see me for your Sports Medicine or Orthopedic needs in the [facility]. I will still be part of [facility].    [facility] [address] [place], [place] [address]  [phone]  Telephone (phone line will be functional starting in November).  [phone] Fax [phone] Concussion Line  If you want to stay locally for your Sports Medicine issues Dr. Benjamin Stainhekkekandam here in [place] will be happy to see you.  Additionally Dr. Clare GandyJeremy Schmitz at [facility] will be happy to see you for sports medicine issues more locally.   For your primary care needs you are welcome to establish care with Dr. Sunnie NielsenNatalie Alexander.  We are working quickly to hire more physicians to cover the primary care needs however if you cannot get an appointment with Dr. Lyn HollingsheadAlexander in a timely manner  has locations and openings for primary care services nearby.   [facility] at [facility] [address] . [place] , [address] [place] Main Line: [phone] . Behavioral Medicine: [phone] . Fax: [phone]  [facility] [address] . [place], [place] Main Line: [phone] . Behavioral Medicine: [phone] . Fax: [phone] . Hours (M-F): 7am - Armed forces operational officer5pm  [facility] [address] . [place], [place]: [phone] . Behavioral Medicine: [phone] . Fax: [phone] . Hours (M-F): 8am - Production assistant, radio5pm    [facility] [address] [place], [place] Phone: [phone] . Behavioral Medicine: [phone] . Fax: [phone]  Shoulder Dislocation  A shoulder dislocation happens when the upper arm bone (humerus) moves out of the shoulder joint. The shoulder joint is the part of the shoulder where the humerus, shoulder blade (scapula), and collarbone (clavicle) meet. What are the causes? This condition is often caused by:  A fall.  A hard, direct hit to the shoulder.  A forceful movement of the shoulder. What increases the risk? You are more likely to develop this condition if you play sports. What are the signs or symptoms? Symptoms of this condition include:  Deformity of the shoulder.  Severe pain.  Inability to move the shoulder.  Numbness, weakness, or tingling in your neck or down your arm.  Bruising or swelling around your shoulder. How is this diagnosed? This condition is diagnosed with a physical exam. After the exam, tests may be done to check for related problems. Tests may include:  X-ray. This may be done to check for broken bones.  MRI. This may be done to check for damage to the tissues around the shoulder.  Electromyogram. This may be done to check for nerve damage. How is this treated? This condition is treated with a procedure to place the humerus back in the joint. This procedure is called a reduction. There are two types of reduction:  Closed reduction. The humerus is placed back in the joint without surgery.  The health care provider uses his or her hands to guide the bone back into place.  Open reduction. Surgery is done to place the humerus back in the joint. An open reduction may be recommended if: ? You have a weak shoulder joint or weak ligaments. ? You have had more than one shoulder dislocation. ? The nerves or blood vessels around your shoulder have been damaged. After reduction, your shoulder and  arm will be placed in a brace or sling to prevent it from moving. After the brace or sling is removed, you will have physical therapy to help improve the range of motion in your shoulder joint. Follow these instructions at home: Medicines  Take over-the-counter and prescription medicines only as told by your health care provider.  Ask your health care provider if the medicine prescribed to you: ? Requires you to avoid driving or using heavy machinery. ? Can cause constipation. You may need to take these actions to prevent or treat constipation:  Drink enough fluid to keep your urine pale yellow.  Take over-the-counter or prescription medicines.  Eat foods that are high in fiber, such as beans, whole grains, and fresh fruits and vegetables.  Limit foods that are high in fat and processed sugars, such as fried or sweet foods. If you have a brace or sling:  Wear the brace or sling as told by your health care provider. Remove it only as told by your health care provider.  Loosen the brace or sling if your fingers tingle, become numb, or turn cold and blue.  Keep the brace or sling clean.  If the brace or sling is not waterproof: ? Do not let it get wet. ? Cover it with a watertight covering when you take a bath or shower. Managing pain, stiffness, and swelling   If directed, put ice on the injured area. ? If you have a removable brace or sling, remove it as told by your health care provider. ? Put ice in a plastic bag. ? Place a towel between your skin and the bag. ? Leave the ice on for 20 minutes, 2-3 times per day.  Move your fingers often to reduce stiffness and swelling.  Raise (elevate) the injured area above the level of your heart while you are sitting or lying down. Activity  Do not lift your arm above shoulder level until your health care provider approves.  Do not lift anything until your health care provider says that it is safe.  Do not push or pull things  until your health care provider approves.  Return to your normal activities as told by your health care provider. Ask your health care provider what activities are safe for you.  Perform range-of-motion exercises only as told by your health care provider.  Exercise your hand by squeezing a soft ball. This helps to decrease stiffness and swelling in your hand and wrist. General instructions  Do not drive while wearing a brace or sling on a hand that you use for driving. Ask your health care provider when it is safe to drive after the brace or sling is removed.  Do not take baths, swim, or use a hot tub until your health care provider approves. Ask your health care provider if you may take showers. You may only be allowed to take sponge baths.  Do not use any products that contain nicotine or tobacco, such as cigarettes, e-cigarettes, and chewing tobacco. These can delay healing. If you need help quitting, ask your  health care provider.  Keep all follow-up visits as told by your health care provider. This is important. Contact a health care provider if:  Your brace or sling gets damaged. Get help right away if:  Your pain gets worse rather than better.  You lose feeling in your arm or hand.  Your arm or hand becomes white and cold. Summary  A shoulder dislocation happens when the upper arm bone moves out of the shoulder joint.  It is usually caused by a fall, a strong hit to the shoulder, or a forceful movement of the shoulder.  It causes severe pain, inability to move the shoulder, numbness, weakness, or tingling.  This condition is treated with either closed or open reduction. You will also be given a brace or sling. You will do exercises to increase your shoulder's range of motion.  Contact a health care provider if your brace or sling gets damaged. Get help right away if your pain gets worse, you lose feeling in your arm or hand, or your arm or hand becomes white or cold. This  information is not intended to replace advice given to you by your health care provider. Make sure you discuss any questions you have with your health care provider. Document Released: 02/05/2001 Document Revised: 12/10/2017 Document Reviewed: 12/10/2017 Elsevier Patient Education  2020 [employer]

## 2019-01-08 NOTE — Progress Notes (Signed)
Ashlee Newton K Ostermiller is a 23 y.o. female who presents to [facility] today for shoulder injury follow-up.  Patient was watching a baseball game and vigorously cheering and doing fist bumps in the air with her right arm on July 27.  She felt a pop in her shoulder and her shoulder became stuck in a abducted and externally rotated position.  This was very painful.  She went to the emergency room but while in the emergency room her arm spontaneously reduced.  She had x-rays that were unremarkable.  She was given a sling and NSAIDs.  She followed up with my colleague Dr. Clare GandyJeremy Schmitz at [facility] on July 28.  He was concern for subluxation injury and proceeded with sling and NSAIDs.  In the interim [name] is started some passive range of motion exercises for her shoulder.  She is gained quite a bit of range of motion and is feeling a lot better.  She continues to have pain in her shoulder but denies any further subluxation or dislocation episodes.  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  She denies any radiating pain weakness or numbness distally.   ROS:  As above  Exam:  BP 116/73 (BP Location: Left Arm, Patient Position: Sitting, Cuff Size: Large)   Pulse 86   Temp 98.1 F (36.7 C) (Oral)   Wt 219 lb 8 oz (99.6 kg)   BMI 37.68 kg/m  Wt Readings from Last 5 Encounters:  01/08/19 219 lb 8 oz (99.6 kg)  12/22/18 220 lb (99.8 kg)  08/06/18 207 lb (93.9 kg)  07/19/18 211 lb (95.7 kg)  07/13/18 210 lb (95.3 kg)   General: Well Developed, well nourished, and in no acute distress.  Neuro/Psych: Alert and oriented x3, extra-ocular muscles intact, able to move all 4 extremities, sensation grossly intact. Skin: Warm and dry, no rashes noted.  Respiratory: Not using accessory muscles, speaking in full sentences, trachea midline.  Cardiovascular: Pulses palpable, no extremity edema.  Abdomen: Does not appear distended. MSK: C-spine: Nontender to spinal midline.  Normal cervical motion. Right shoulder: Normal-appearing not particularly tender.   Range of motion: External rotation full. Internal rotation lumbar spine. Abduction 140 degrees passive 100 degrees active.  Limited by pain. Negative clunk and relocation test.  Mildly positive anterior apprehension test. Intact strength to abduction external/internal rotation.  Negative empty can test.  Negative Yergason's and speeds test  Contralateral left shoulder normal-appearing nontender normal motion normal strength negative impingement testing negative labral testing  Pulses cap refill and sensation intact bilateral upper extremities.    Lab and Radiology Results Dg Shoulder Right  Result Date: 12/21/2018 CLINICAL DATA:  RIGHT shoulder pain. History of shoulder dislocation. EXAM: RIGHT SHOULDER - 2+ VIEW COMPARISON:  Plain film of the RIGHT shoulder dated 12/08/2010. FINDINGS: Osseous alignment is normal. No fracture line or displaced fracture fragment seen. Soft tissues about the RIGHT shoulder are unremarkable. IMPRESSION: Negative.  No osseous fracture or dislocation seen. Electronically Signed   By: Bary RichardStan  Maynard M.D.   On: 12/21/2018 21:14   I personally (independently) visualized and performed the interpretation of the images attached in this note.     Assessment and Plan: 23 y.o. female with right shoulder pain.  Patient describes a dislocation event.  She had spontaneous reduction prior to thorough evaluation in ED.  We discussed her options.  She is  showing significant symptom improvement and has good strength.  She does not have obvious laxity to exam testing today.  Ideally best treatment protocol would be formal physical therapy however patient has a high deductible and notes that would be very expensive.  We discussed options.  Plan to proceed with home exercise program and check back in about 4 weeks.  If  completely better okay did not recheck in person however if having pain or subtle subluxation or mechanical symptoms patient return and we will proceed likely with MRI arthrogram for potential surgical planning.  Recheck as needed.  Additionally informed patient that I am transitioning to sports medicine only in [place] in November.  Happy to continue seeing her for orthopedic or sports medicine related issues but discussed new PCP options.   PDMP not reviewed this encounter. No orders of the defined types were placed in this encounter.  No orders of the defined types were placed in this encounter.   Historical information moved to improve visibility of documentation.  Past Medical History:  Diagnosis Date  . Allergies   . Allergy   . Migraine    Past Surgical History:  Procedure Laterality Date  . TONSILLECTOMY AND ADENOIDECTOMY    . TYMPANOSTOMY TUBE PLACEMENT     Social History   Tobacco Use  . Smoking status: Never Smoker  . Smokeless tobacco: Never Used  Substance Use Topics  . Alcohol use: Yes   family history includes Cancer in her maternal grandfather; Hypercholesterolemia in her mother; Hypertension in her father; Migraines in her mother.  Medications: Current Outpatient Medications  Medication Sig Dispense Refill  . diclofenac sodium (VOLTAREN) 1 % GEL Apply 4 g topically 4 (four) times daily. To affected joint. 100 g 11  . drospirenone-ethinyl estradiol (YAZ,GIANVI,LORYNA) 3-0.02 MG tablet Take 1 tablet by mouth daily. 1 Package 11  . Ibuprofen-Famotidine 800-26.6 MG TABS Take 1 tablet by mouth 3 (three) times daily. 90 tablet 3  . ipratropium (ATROVENT) 0.06 % nasal spray Place 2 sprays into both nostrils every 4 (four) hours as needed. 10 mL 6  . levocetirizine (XYZAL) 5 MG tablet Take by mouth.    . montelukast (SINGULAIR) 10 MG tablet Take by mouth.    Marland Kitchen omeprazole (PRILOSEC) 40 MG capsule Take 1 capsule (40 mg total) by mouth daily. 30 capsule 3  .  SUMAtriptan (IMITREX) 50 MG tablet Take 1 tablet (50 mg total) by mouth every 2 (two) hours as needed for migraine. May repeat in 2 hours if headache persists or recurs. 12 tablet 0  . triamcinolone cream (KENALOG) 0.1 % Apply 1 application topically 2 (two) times daily. 30 g 0  . naproxen (NAPROSYN) 500 MG tablet Take 1 tablet (500 mg total) by mouth 2 (two) times daily. (Patient not taking: Reported on 01/08/2019) 14 tablet 0  . ondansetron (ZOFRAN ODT) 4 MG disintegrating tablet Take 1 tablet (4 mg total) by mouth every 8 (eight) hours as needed for nausea or vomiting. (Patient not taking: Reported on 01/08/2019) 12 tablet 0   No current facility-administered medications for this visit.    Allergies  Allergen Reactions  . Contrast Media [Iodinated Diagnostic Agents] Rash      Discussed warning signs or symptoms. Please see discharge instructions. Patient expresses understanding.

## 2019-01-13 ENCOUNTER — Ambulatory Visit: Payer: BC Managed Care – PPO | Admitting: Family Medicine

## 2019-02-04 ENCOUNTER — Encounter: Payer: Self-pay | Admitting: Family Medicine

## 2019-02-04 DIAGNOSIS — M25511 Pain in right shoulder: Secondary | ICD-10-CM

## 2019-02-15 ENCOUNTER — Other Ambulatory Visit: Payer: Self-pay

## 2019-02-15 ENCOUNTER — Ambulatory Visit (INDEPENDENT_AMBULATORY_CARE_PROVIDER_SITE_OTHER): Payer: BC Managed Care – PPO

## 2019-02-15 ENCOUNTER — Encounter: Payer: Self-pay | Admitting: Family Medicine

## 2019-02-15 ENCOUNTER — Ambulatory Visit: Payer: BC Managed Care – PPO | Admitting: Family Medicine

## 2019-02-15 VITALS — BP 145/89 | HR 102 | Temp 98.1°F | Wt 220.0 lb

## 2019-02-15 DIAGNOSIS — M25511 Pain in right shoulder: Secondary | ICD-10-CM

## 2019-02-15 IMAGING — MR MR SHOULDER*R* W/CM
5 series · 40 of 40 positions shown · IV contrast (agent unspecified)
Comparison: X-rays [DATE]

CLINICAL DATA: Shoulder pain.  History of dislocation.

EXAM:
MR ARTHROGRAM OF THE RIGHT SHOULDER
TECHNIQUE: Multiplanar, multisequence MR imaging of the right shoulder was
performed following the administration of intra-articular contrast.
CONTRAST:  See Injection Documentation.

[Series 3: T1 fat-sat · axial · 4.0mm · 0.47mm/px · z∈[-29,+53]mm · 7 of 20 slices shown (1 of 3)]
[im 1/20]
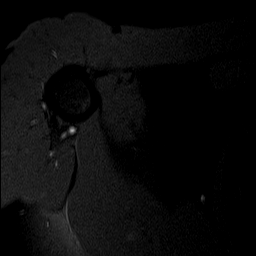
[im 4/20]
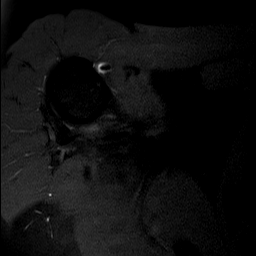
[im 7/20]
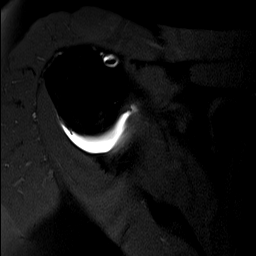
[im 10/20]
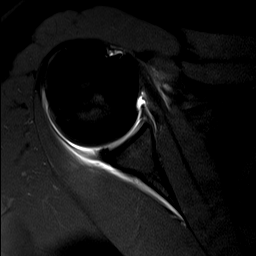
[im 13/20]
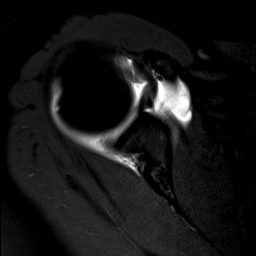
[im 16/20]
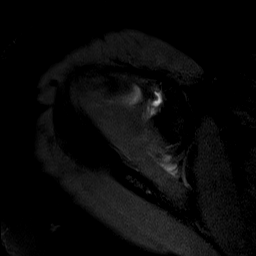
[im 20/20]
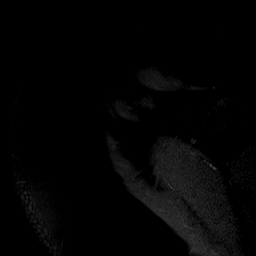

[Series 5: T2 fat-sat · oblique · 4.0mm · 0.55mm/px · 7 of 20 slices shown (1 of 2)]
[im 1/20]
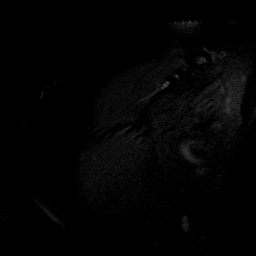
[im 4/20]
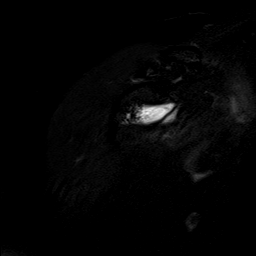
[im 7/20]
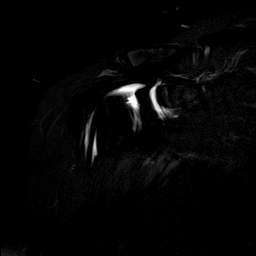
[im 10/20]
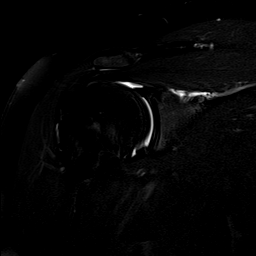
[im 13/20]
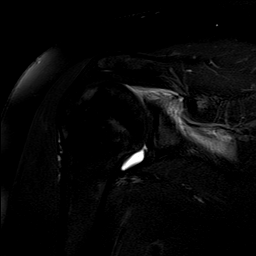
[im 16/20]
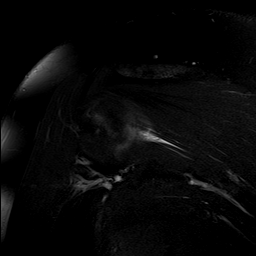
[im 20/20]
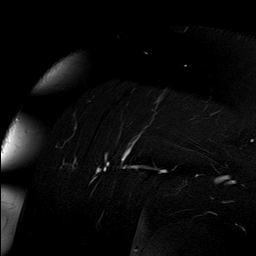

[Series 7: T2 fat-sat · oblique · 4.0mm · 0.55mm/px · 10 of 25 slices shown (2 of 2)]
[im 1/25]
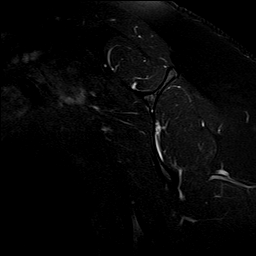
[im 3/25]
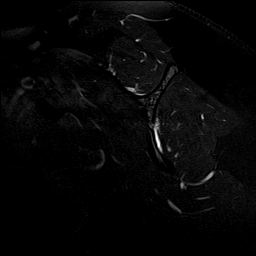
[im 6/25]
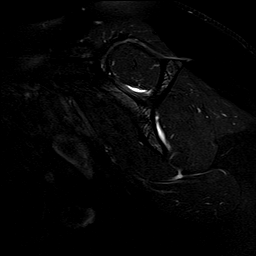
[im 9/25]
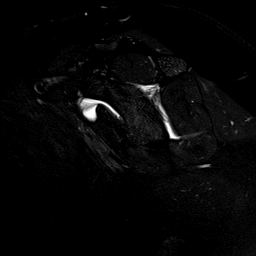
[im 11/25]
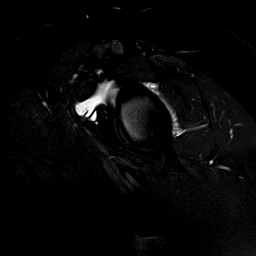
[im 14/25]
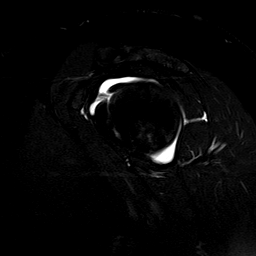
[im 17/25]
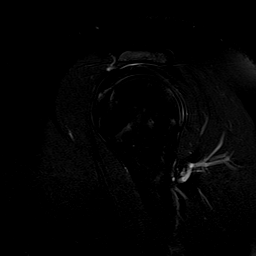
[im 19/25]
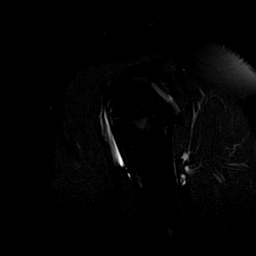
[im 22/25]
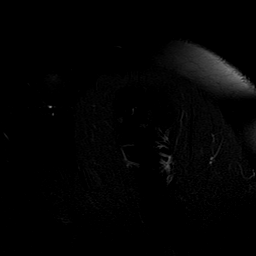
[im 25/25]
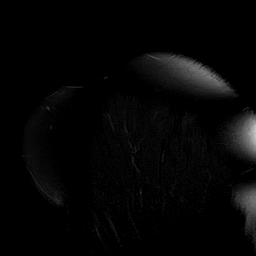

[Series 8: T1 fat-sat · oblique · 4.0mm · 0.59mm/px · 8 of 20 slices shown (2 of 3)]
[im 1/20]
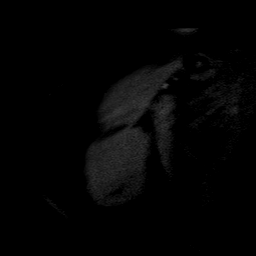
[im 3/20]
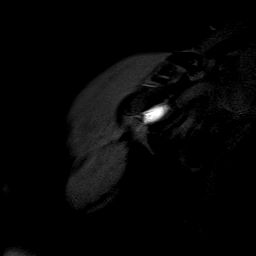
[im 6/20]
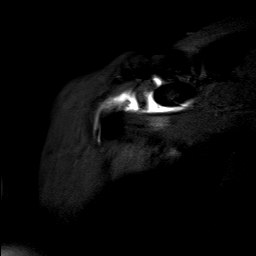
[im 9/20]
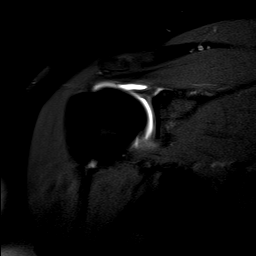
[im 11/20]
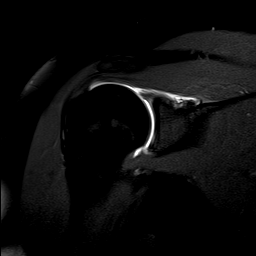
[im 14/20]
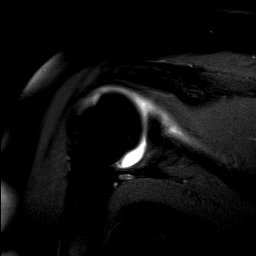
[im 17/20]
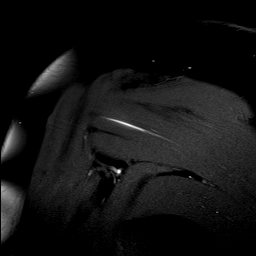
[im 20/20]
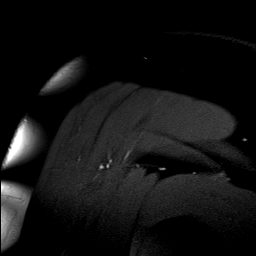

[Series 10: T1 fat-sat · oblique · non-contrast · 4.0mm · 0.47mm/px · 8 of 20 slices shown (3 of 3)]
[im 1/20]
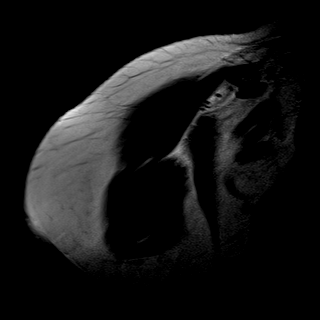
[im 3/20]
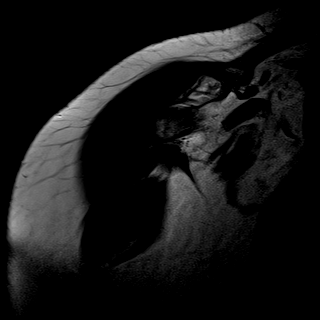
[im 6/20]
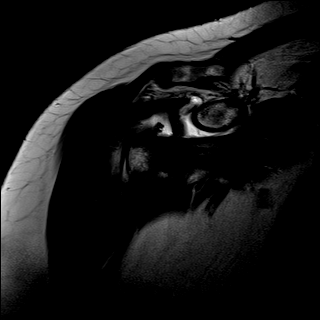
[im 9/20]
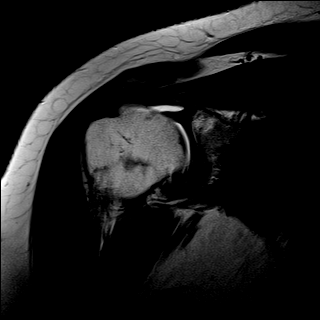
[im 11/20]
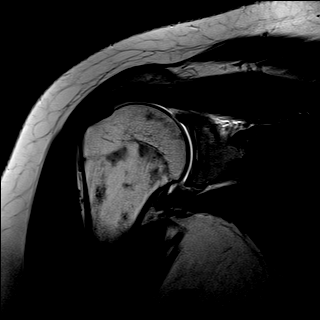
[im 14/20]
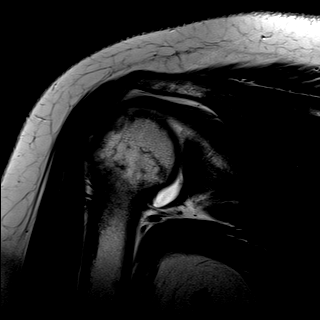
[im 17/20]
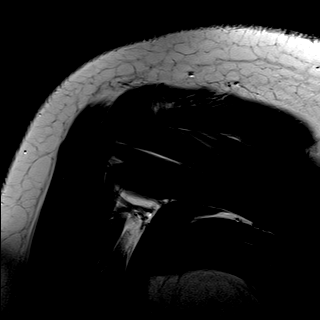
[im 20/20]
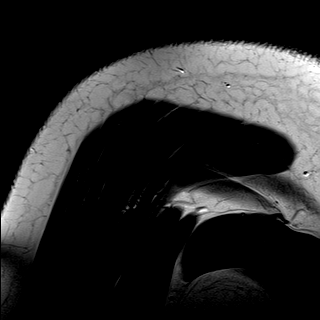

[40 of 40 positions shown; findings below may reference images not displayed]

FINDINGS: Rotator cuff: Small low-grade articular-surface of the posterior
insertional fibers of the infraspinatus tendon (series 8, image 6).
No full-thickness component. Supraspinatus, subscapularis, and teres
minor tendons intact.

Muscles: Normal muscle bulk and signal without atrophy or fatty
infiltration.

Biceps long head: Intact and appropriately located.

Acromioclavicular Joint: Normal. No fluid or contrast within the
subacromial-subdeltoid bursa.

Glenohumeral Joint: Well distended with injected contrast. No
chondral defect.

Labrum: Blunted, heterogeneous appearance of the anteroinferior
glenoid labrum suggesting prior tear. No acute/displaced tear
identified.

Bones: No fracture, focal marrow signal abnormality, or bone lesion.
IMPRESSION: 1. Low-grade partial-thickness articular surface tear of the
posterior aspect of the infraspinatus tendon. No full-thickness
rotator cuff tear.
2. Heterogeneous appearance of the anteroinferior labrum suggesting
sequela of prior injury.

## 2019-02-15 NOTE — Progress Notes (Signed)
    Patient presents to clinic for  previously scheduled gadolinium interarticular contrast.  Procedure: Real-time Ultrasound Guided Injection of right shoulder GH joint  Device: GE Logiq E  Images permanently stored and available for review in the ultrasound unit. Verbal informed consent obtained. Discussed risks and benefits of procedure. Warned about infection bleeding damage to structures skin hypopigmentation and fat atrophy among others. Patient expresses understanding and agreement Time-out conducted.  Noted no overlying erythema, induration, or other signs of local infection.  Skin prepped in a sterile fashion.  Local anesthesia: Topical Ethyl chloride.  With sterile technique and under real time ultrasound guidance: 5 milliliters of Marcaine, 0.1 mL gadolinium contrast, 5 mL of sterile saline.  Injected easily.  Completed without difficulty    Advised to call if fevers/chills, erythema, induration, drainage, or persistent bleeding.  Images permanently stored and available for review in the ultrasound unit.  Impression: Technically successful ultrasound guided injection.

## 2019-02-15 NOTE — Patient Instructions (Signed)
Thank you for coming in today. Call or go to the ER if you develop a large red swollen joint with extreme pain or oozing puss.  I will send the results to you ASAP.  We will have a discussion if need to.

## 2019-02-16 ENCOUNTER — Ambulatory Visit: Payer: BC Managed Care – PPO | Admitting: Family Medicine

## 2019-05-14 ENCOUNTER — Other Ambulatory Visit: Payer: Self-pay | Admitting: Certified Nurse Midwife

## 2019-05-18 ENCOUNTER — Ambulatory Visit: Payer: BC Managed Care – PPO | Attending: Internal Medicine

## 2019-05-18 ENCOUNTER — Other Ambulatory Visit: Payer: BC Managed Care – PPO

## 2019-05-18 ENCOUNTER — Other Ambulatory Visit: Payer: Self-pay

## 2019-05-18 DIAGNOSIS — Z20822 Contact with and (suspected) exposure to covid-19: Secondary | ICD-10-CM

## 2019-05-19 LAB — NOVEL CORONAVIRUS, NAA: SARS-CoV-2, NAA: NOT DETECTED

## 2019-06-15 ENCOUNTER — Ambulatory Visit: Payer: BC Managed Care – PPO | Admitting: Certified Nurse Midwife

## 2019-06-25 ENCOUNTER — Ambulatory Visit: Payer: BC Managed Care – PPO | Admitting: Certified Nurse Midwife

## 2019-06-25 ENCOUNTER — Encounter: Payer: Self-pay | Admitting: Certified Nurse Midwife

## 2019-06-25 ENCOUNTER — Other Ambulatory Visit: Payer: Self-pay

## 2019-06-25 VITALS — BP 123/80 | HR 81 | Temp 98.5°F | Resp 16 | Ht 64.0 in | Wt 223.0 lb

## 2019-06-25 DIAGNOSIS — A749 Chlamydial infection, unspecified: Secondary | ICD-10-CM

## 2019-06-25 DIAGNOSIS — Z01419 Encounter for gynecological examination (general) (routine) without abnormal findings: Secondary | ICD-10-CM

## 2019-06-25 DIAGNOSIS — Z113 Encounter for screening for infections with a predominantly sexual mode of transmission: Secondary | ICD-10-CM

## 2019-06-25 DIAGNOSIS — Z30430 Encounter for insertion of intrauterine contraceptive device: Secondary | ICD-10-CM | POA: Diagnosis not present

## 2019-06-25 DIAGNOSIS — Z3202 Encounter for pregnancy test, result negative: Secondary | ICD-10-CM

## 2019-06-25 DIAGNOSIS — Z124 Encounter for screening for malignant neoplasm of cervix: Secondary | ICD-10-CM | POA: Diagnosis not present

## 2019-06-25 LAB — POCT URINE PREGNANCY: Preg Test, Ur: NEGATIVE

## 2019-06-25 MED ORDER — LEVONORGESTREL 20 MCG/24HR IU IUD: INTRAUTERINE_SYSTEM | Freq: Once | INTRAUTERINE | Status: AC

## 2019-06-25 NOTE — Progress Notes (Signed)
GYNECOLOGY ANNUAL PREVENTATIVE CARE ENCOUNTER NOTE  History:     Ashlee Newton is a 24 y.o. G0P0000 female here for a routine annual gynecologic exam.  Current complaints: birth control counseling- desires IUD insertion. Denies abnormal vaginal bleeding, discharge, pelvic pain, problems with intercourse or other gynecologic concerns. Patient request STD screening.    Gynecologic History Patient's last menstrual period was 06/22/2019. Contraception: OCP (estrogen/progesterone) Last Pap: 04/2018. Results were: normal with negative HPV  Obstetric History OB History  Gravida Para Term Preterm AB Living  0 0 0 0 0 0  SAB TAB Ectopic Multiple Live Births  0 0 0 0 0    Past Medical History:  Diagnosis Date  . Allergies   . Allergy   . Migraine     Past Surgical History:  Procedure Laterality Date  . TONSILLECTOMY AND ADENOIDECTOMY    . TYMPANOSTOMY TUBE PLACEMENT      Current Outpatient Medications on File Prior to Visit  Medication Sig Dispense Refill  . levocetirizine (XYZAL) 5 MG tablet Take by mouth.    . montelukast (SINGULAIR) 10 MG tablet Take by mouth.    . diclofenac sodium (VOLTAREN) 1 % GEL Apply 4 g topically 4 (four) times daily. To affected joint. (Patient not taking: Reported on 06/25/2019) 100 g 11  . Ibuprofen-Famotidine 800-26.6 MG TABS Take 1 tablet by mouth 3 (three) times daily. (Patient not taking: Reported on 06/25/2019) 90 tablet 3  . ipratropium (ATROVENT) 0.06 % nasal spray Place 2 sprays into both nostrils every 4 (four) hours as needed. (Patient not taking: Reported on 06/25/2019) 10 mL 6  . naproxen (NAPROSYN) 500 MG tablet Take 1 tablet (500 mg total) by mouth 2 (two) times daily. (Patient not taking: Reported on 01/08/2019) 14 tablet 0  . NIKKI 3-0.02 MG tablet TAKE 1 TABLET BY MOUTH EVERY DAY (Patient not taking: Reported on 06/25/2019) 28 tablet 0  . omeprazole (PRILOSEC) 40 MG capsule Take 1 capsule (40 mg total) by mouth daily. (Patient not taking:  Reported on 06/25/2019) 30 capsule 3  . ondansetron (ZOFRAN ODT) 4 MG disintegrating tablet Take 1 tablet (4 mg total) by mouth every 8 (eight) hours as needed for nausea or vomiting. (Patient not taking: Reported on 01/08/2019) 12 tablet 0  . SUMAtriptan (IMITREX) 50 MG tablet Take 1 tablet (50 mg total) by mouth every 2 (two) hours as needed for migraine. May repeat in 2 hours if headache persists or recurs. (Patient not taking: Reported on 06/25/2019) 12 tablet 0  . triamcinolone cream (KENALOG) 0.1 % Apply 1 application topically 2 (two) times daily. (Patient not taking: Reported on 06/25/2019) 30 g 0  . [DISCONTINUED] drospirenone-ethinyl estradiol (YAZ,GIANVI,LORYNA) 3-0.02 MG tablet Take 1 tablet by mouth daily. 1 Package 11   No current facility-administered medications on file prior to visit.    Allergies  Allergen Reactions  . Contrast Media [Iodinated Diagnostic Agents] Rash    Social History:  reports that she has never smoked. She has never used smokeless tobacco. She reports current alcohol use. She reports that she does not use drugs.  Family History  Problem Relation Age of Onset  . Migraines Mother   . Hypercholesterolemia Mother   . Hypertension Father   . Cancer Maternal Grandfather        pancreatitis --> pancreatic CA    The following portions of the patient's history were reviewed and updated as appropriate: allergies, current medications, past family history, past medical history, past social history, past  surgical history and problem list.  Review of Systems Pertinent items noted in HPI and remainder of comprehensive ROS otherwise negative.  Physical Exam:  BP 123/80   Pulse 81   Temp 98.5 F (36.9 C)   Resp 16   Ht 5\' 4"  (1.626 m)   Wt 223 lb (101.2 kg)   LMP 06/22/2019   BMI 38.28 kg/m  CONSTITUTIONAL: Well-developed, well-nourished female in no acute distress.  HENT:  Normocephalic, atraumatic, External right and left ear normal. Oropharynx is clear  and moist EYES: Conjunctivae and EOM are normal. Pupils are equal, round, and reactive to light.   NECK: Normal range of motion, supple, no masses.  Normal thyroid.  SKIN: Skin is warm and dry. No rash noted. Not diaphoretic. No erythema. No pallor. MUSCULOSKELETAL: Normal range of motion. No tenderness.  No cyanosis, clubbing, or edema.  2+ distal pulses. NEUROLOGIC: Alert and oriented to person, place, and time. Normal reflexes, muscle tone coordination.  PSYCHIATRIC: Normal mood and affect. Normal behavior. Normal judgment and thought content. CARDIOVASCULAR: Normal heart rate noted, regular rhythm RESPIRATORY: Clear to auscultation bilaterally. Effort and breath sounds normal, no problems with respiration noted. BREASTS: Symmetric in size. No masses, tenderness, skin changes, nipple drainage, or lymphadenopathy bilaterally. ABDOMEN: Soft, no distention noted.  No tenderness, rebound or guarding.  PELVIC: Normal appearing external genitalia and urethral meatus; normal appearing vaginal mucosa and cervix.  No abnormal discharge noted.  Pap smear obtained.  Normal uterine size, no other palpable masses, no uterine or adnexal tenderness.   IUD Insertion Procedure Note Patient identified, informed consent performed.  Discussed risks of irregular bleeding, cramping, infection, malpositioning or misplacement of the IUD outside the uterus which may require further procedure such as laparoscopy. Time out was performed.  Urine pregnancy test negative.  Speculum placed in the vagina.  Cervix visualized.  Cleaned with Betadine x 3.  Grasped anteriorly with a single tooth tenaculum.  Uterus sounded to 7 cm.  Mirena IUD placed per manufacturer's recommendations.  Strings trimmed to 3 cm. Tenaculum was removed, good hemostasis noted.  Patient tolerated procedure well.   Patient was given post-procedure instructions.  She was advised to be have backup contraception for one week.  Patient was also asked to  check IUD strings periodically and follow up in 4 weeks for IUD check.    Assessment and Plan:    1. Women's annual routine gynecological examination - Normal well woman examination  - Patient reports fam hx of thyroid disease but unsure of hypo or hyper. Patient reports difficulty loosing weight - TSH obtained today to assess  - POCT urine pregnancy - TSH - Cytology - PAP( [facility])  2. Encounter for IUD insertion - See above procedure note  - levonorgestrel (MIRENA) 20 MCG/24HR IUD  3. Screening for STDs (sexually transmitted diseases) - Patient request screening for STDs  - HIV Antibody (routine testing w rflx) - Hepatitis B Surface AntiGEN - Hepatitis C Antibody - RPR  Will follow up results of pap smear/STD results and manage accordingly. Routine preventative health maintenance measures emphasized. Please refer to After Visit Summary for other counseling recommendations.      06/24/2019, CNM [facility], [facility]

## 2019-06-25 NOTE — Patient Instructions (Signed)
IUD PLACEMENT POST-PROCEDURE INSTRUCTIONS  1. You may take Ibuprofen, Aleve or Tylenol for pain if needed.  Cramping should resolve within in 24 hours.  2. You may have a small amount of spotting.  You should wear a mini pad for the next few days.  3. You may have intercourse after 24 hours.  If you using this for birth control, it is effective immediately.  4. You need to call if you have any pelvic pain, fever, heavy bleeding or foul smelling vaginal discharge.  Irregular bleeding is common the first several months after having an IUD placed. You do not need to call for this reason unless you are concerned.  5. Shower or bathe as normal  6. You should have a follow-up appointment in 4-8 weeks for a re-check to make sure you are not having any problems.  Levonorgestrel intrauterine device (IUD) What is this medicine? LEVONORGESTREL IUD (LEE voe nor jes trel) is a contraceptive (birth control) device. The device is placed inside the uterus by a healthcare professional. It is used to prevent pregnancy. This device can also be used to treat heavy bleeding that occurs during your period. This medicine may be used for other purposes; ask your health care provider or pharmacist if you have questions. COMMON BRAND NAME(S): Kyleena, LILETTA, Mirena, Skyla What should I tell my health care provider before I take this medicine? They need to know if you have any of these conditions:  abnormal Pap smear  cancer of the breast, uterus, or cervix  diabetes  endometritis  genital or pelvic infection now or in the past  have more than one sexual partner or your partner has more than one partner  heart disease  history of an ectopic or tubal pregnancy  immune system problems  IUD in place  liver disease or tumor  problems with blood clots or take blood-thinners  seizures  use intravenous drugs  uterus of unusual shape  vaginal bleeding that has not been explained  an unusual or  allergic reaction to levonorgestrel, other hormones, silicone, or polyethylene, medicines, foods, dyes, or preservatives  pregnant or trying to get pregnant  breast-feeding How should I use this medicine? This device is placed inside the uterus by a health care professional. Talk to your pediatrician regarding the use of this medicine in children. Special care may be needed. Overdosage: If you think you have taken too much of this medicine contact a poison control center or emergency room at once. NOTE: This medicine is only for you. Do not share this medicine with others. What if I miss a dose? This does not apply. Depending on the brand of device you have inserted, the device will need to be replaced every 3 to 6 years if you wish to continue using this type of birth control. What may interact with this medicine? Do not take this medicine with any of the following medications:  amprenavir  bosentan  fosamprenavir This medicine may also interact with the following medications:  aprepitant  armodafinil  barbiturate medicines for inducing sleep or treating seizures  bexarotene  boceprevir  griseofulvin  medicines to treat seizures like carbamazepine, ethotoin, felbamate, oxcarbazepine, phenytoin, topiramate  modafinil  pioglitazone  rifabutin  rifampin  rifapentine  some medicines to treat HIV infection like atazanavir, efavirenz, indinavir, lopinavir, nelfinavir, tipranavir, ritonavir  St. John's wort  warfarin This list may not describe all possible interactions. Give your health care provider a list of all the medicines, herbs, non-prescription drugs, or dietary   supplements you use. Also tell them if you smoke, drink alcohol, or use illegal drugs. Some items may interact with your medicine. What should I watch for while using this medicine? Visit your doctor or health care professional for regular check ups. See your doctor if you or your partner has sexual  contact with others, becomes HIV positive, or gets a sexual transmitted disease. This product does not protect you against HIV infection (AIDS) or other sexually transmitted diseases. You can check the placement of the IUD yourself by reaching up to the top of your vagina with clean fingers to feel the threads. Do not pull on the threads. It is a good habit to check placement after each menstrual period. Call your doctor right away if you feel more of the IUD than just the threads or if you cannot feel the threads at all. The IUD may come out by itself. You may become pregnant if the device comes out. If you notice that the IUD has come out use a backup birth control method like condoms and call your health care provider. Using tampons will not change the position of the IUD and are okay to use during your period. This IUD can be safely scanned with magnetic resonance imaging (MRI) only under specific conditions. Before you have an MRI, tell your healthcare provider that you have an IUD in place, and which type of IUD you have in place. What side effects may I notice from receiving this medicine? Side effects that you should report to your doctor or health care professional as soon as possible:  allergic reactions like skin rash, itching or hives, swelling of the face, lips, or tongue  fever, flu-like symptoms  genital sores  high blood pressure  no menstrual period for 6 weeks during use  pain, swelling, warmth in the leg  pelvic pain or tenderness  severe or sudden headache  signs of pregnancy  stomach cramping  sudden shortness of breath  trouble with balance, talking, or walking  unusual vaginal bleeding, discharge  yellowing of the eyes or skin Side effects that usually do not require medical attention (report to your doctor or health care professional if they continue or are bothersome):  acne  breast pain  change in sex drive or performance  changes in  weight  cramping, dizziness, or faintness while the device is being inserted  headache  irregular menstrual bleeding within first 3 to 6 months of use  nausea This list may not describe all possible side effects. Call your doctor for medical advice about side effects. You may report side effects to FDA at 1-800-FDA-1088. Where should I keep my medicine? This does not apply. NOTE: This sheet is a summary. It may not cover all possible information. If you have questions about this medicine, talk to your doctor, pharmacist, or health care provider.  2020 Elsevier/Gold Standard (2018-03-24 13:22:01)  

## 2019-06-28 LAB — RPR: RPR Ser Ql: NONREACTIVE

## 2019-06-28 LAB — HIV ANTIBODY (ROUTINE TESTING W REFLEX): HIV 1&2 Ab, 4th Generation: NONREACTIVE

## 2019-06-28 LAB — HEPATITIS C ANTIBODY
Hepatitis C Ab: NONREACTIVE
SIGNAL TO CUT-OFF: 0.01 (ref <1.00)

## 2019-06-28 LAB — HEPATITIS B SURFACE ANTIGEN: Hepatitis B Surface Ag: NONREACTIVE

## 2019-06-28 LAB — TSH: TSH: 0.98 mIU/L

## 2019-06-29 ENCOUNTER — Ambulatory Visit (INDEPENDENT_AMBULATORY_CARE_PROVIDER_SITE_OTHER): Payer: BC Managed Care – PPO | Admitting: Nurse Practitioner

## 2019-06-29 ENCOUNTER — Other Ambulatory Visit: Payer: Self-pay

## 2019-06-29 ENCOUNTER — Encounter: Payer: Self-pay | Admitting: Osteopathic Medicine

## 2019-06-29 ENCOUNTER — Ambulatory Visit (INDEPENDENT_AMBULATORY_CARE_PROVIDER_SITE_OTHER): Payer: BC Managed Care – PPO

## 2019-06-29 VITALS — BP 127/83 | HR 79 | Temp 98.2°F | Wt 224.1 lb

## 2019-06-29 DIAGNOSIS — M5412 Radiculopathy, cervical region: Secondary | ICD-10-CM

## 2019-06-29 DIAGNOSIS — M5416 Radiculopathy, lumbar region: Secondary | ICD-10-CM

## 2019-06-29 DIAGNOSIS — M546 Pain in thoracic spine: Secondary | ICD-10-CM | POA: Diagnosis not present

## 2019-06-29 IMAGING — DX DG THORACIC SPINE 2V
3 series · 3 of 3 positions shown · non-contrast
Comparison: None.

CLINICAL DATA: Left-sided back pain

EXAM:
THORACIC SPINE 2 VIEWS

[t-spine ap]
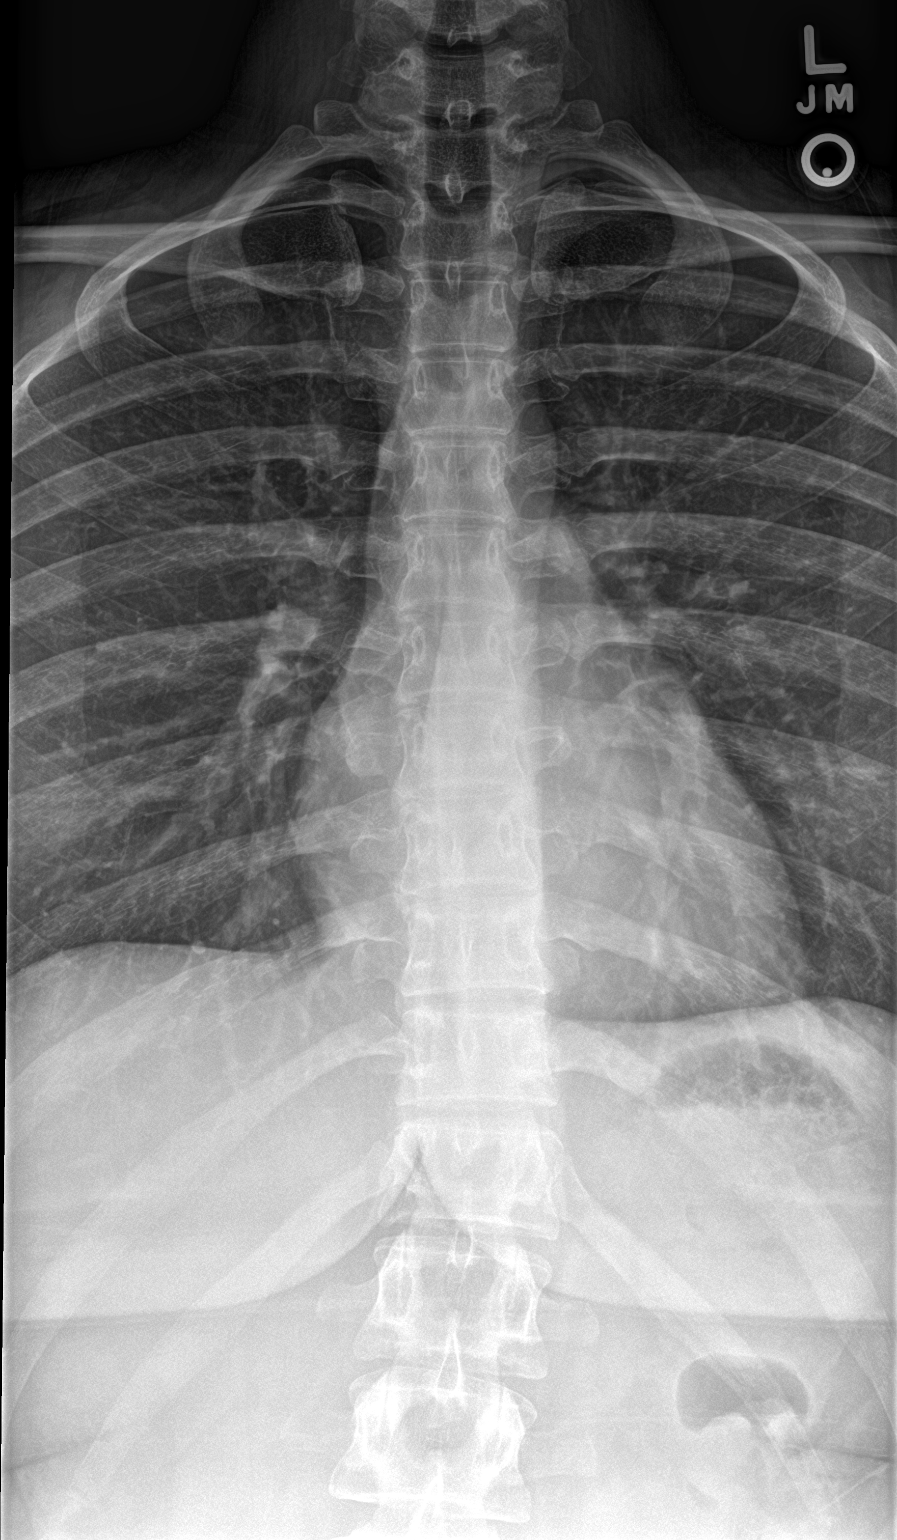

[t-spine lat]
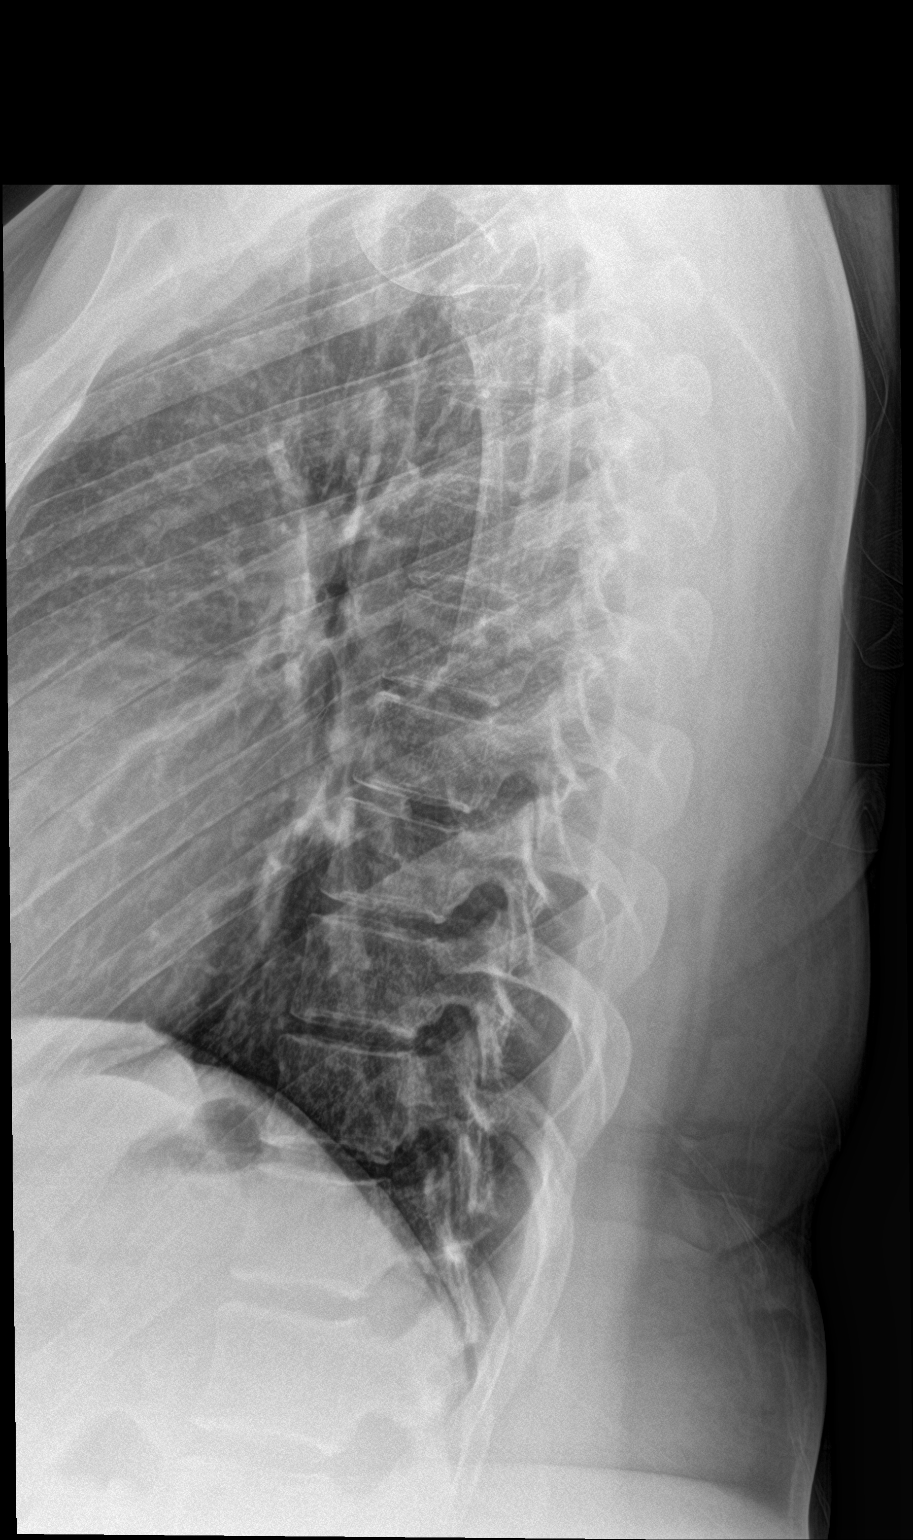

[t-spine swimmers]
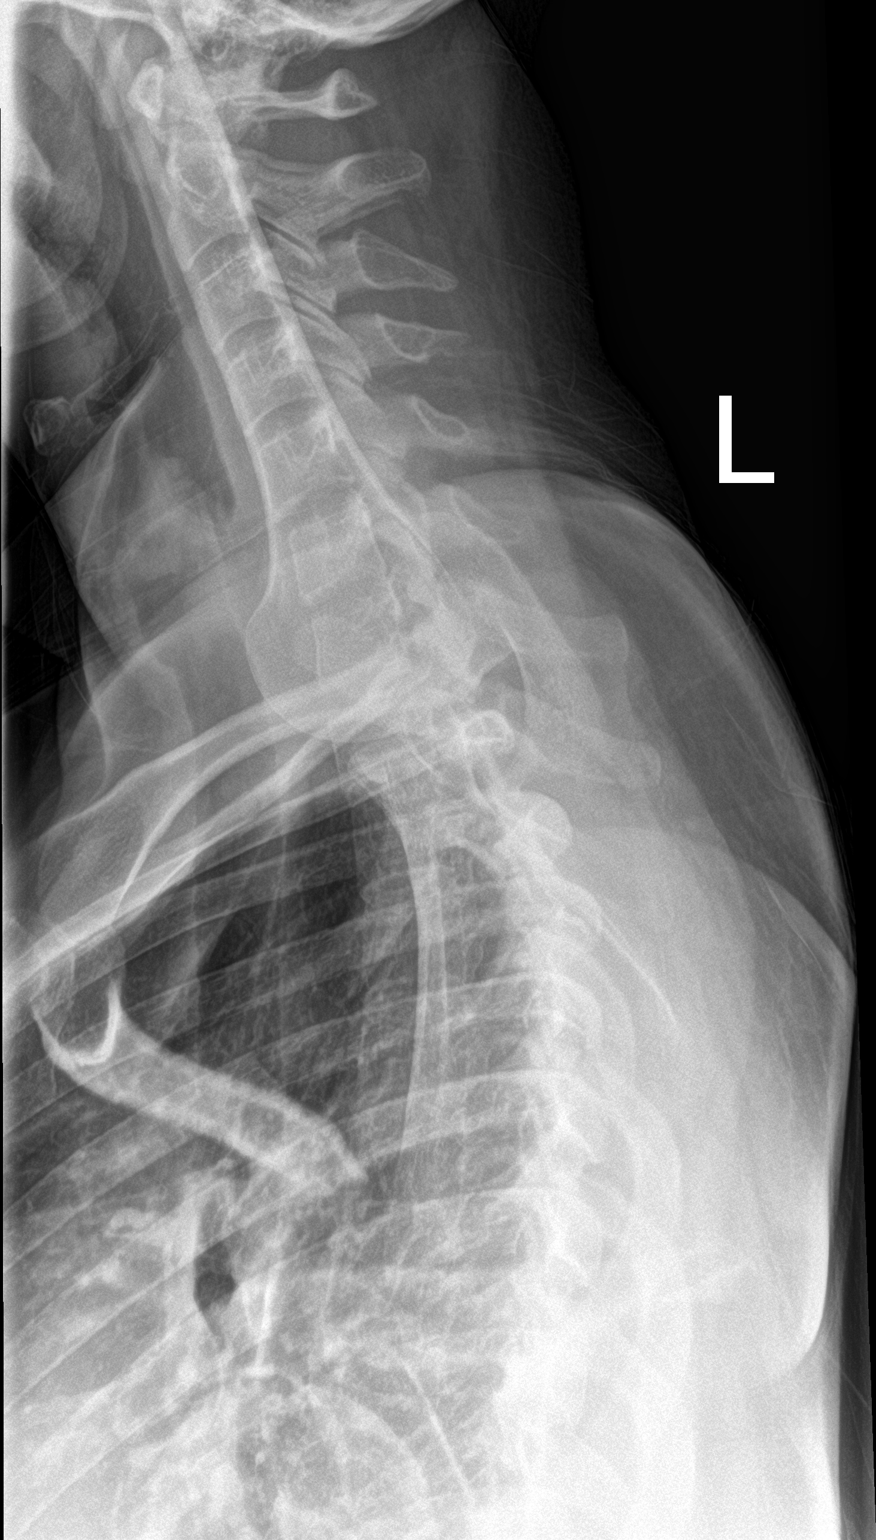

[3 of 3 positions shown; findings below may reference images not displayed]

FINDINGS: Frontal and lateral views of the lumbar spine demonstrate no acute
displaced fractures. Alignment is anatomic. Paraspinal soft tissues
are normal. Visualized portions of the lungs are clear.
IMPRESSION: 1. Unremarkable thoracic spine.

## 2019-06-29 IMAGING — DX DG CERVICAL SPINE COMPLETE 4+V
5 series · 5 of 5 positions shown · non-contrast
Comparison: [DATE]

CLINICAL DATA: Left-sided neck pain

EXAM:
CERVICAL SPINE - COMPLETE 4+ VIEW

[c-spine lat]
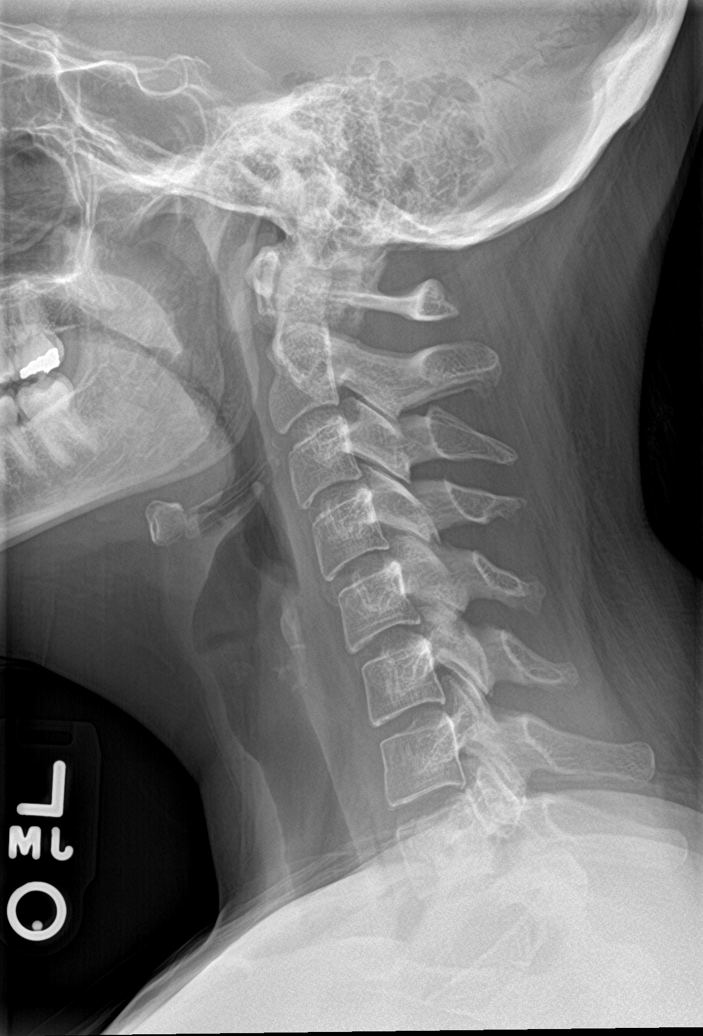

[c-spine obl (1 of 2)]
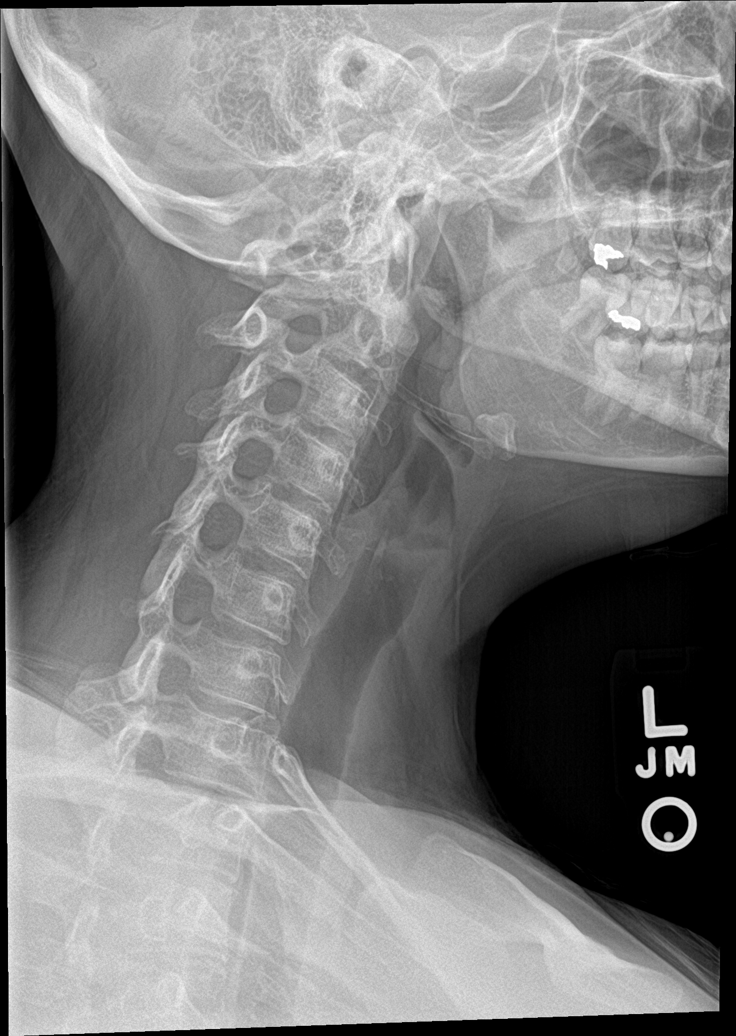

[c-spine obl (2 of 2)]
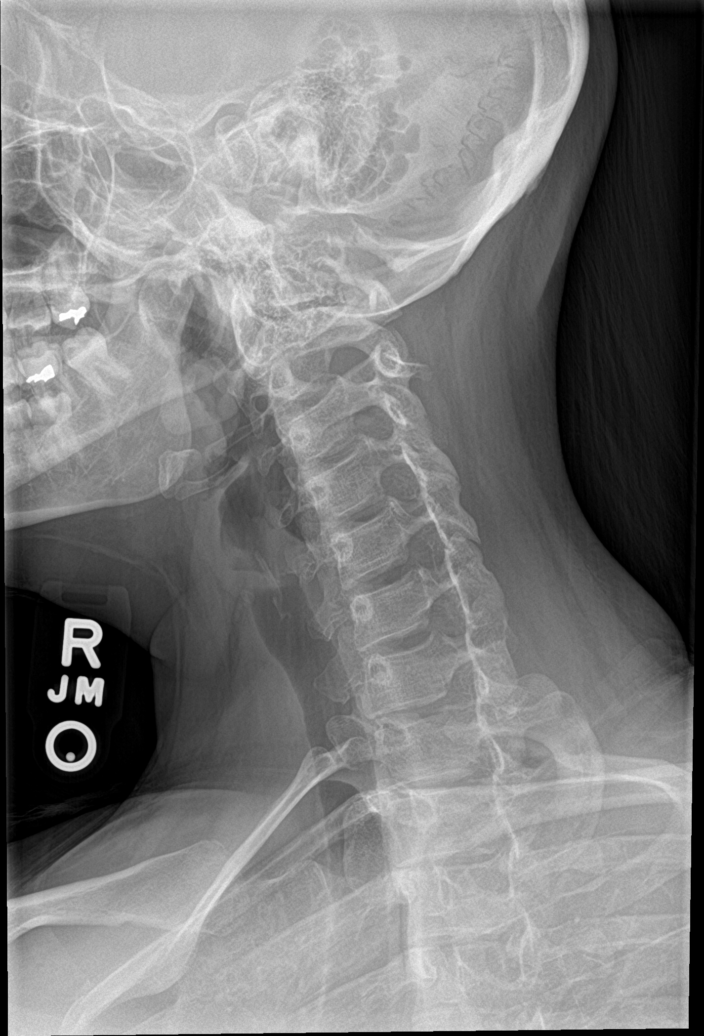

[c-spine ap]
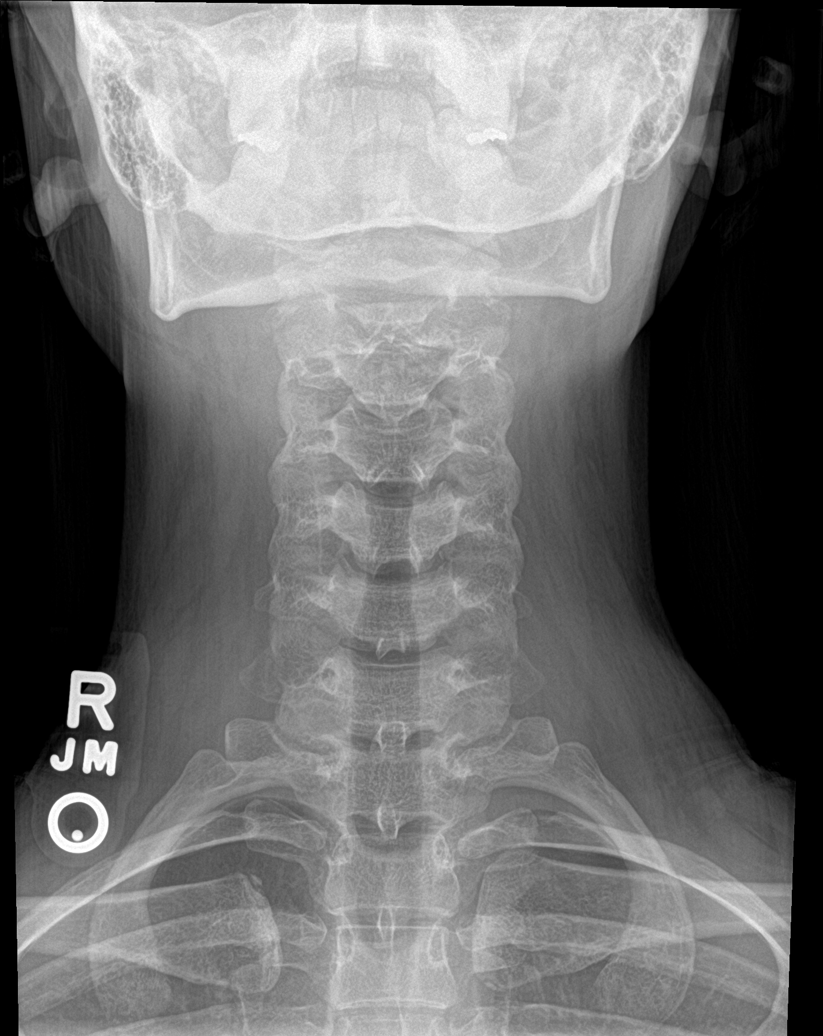

[c-spine open mouth]
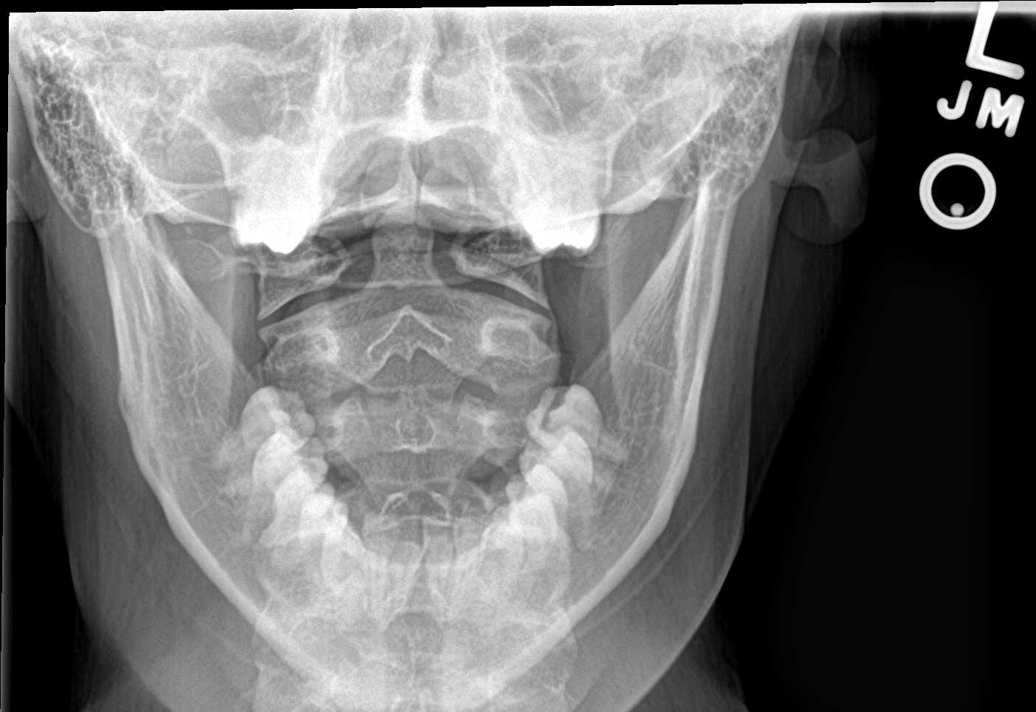

[5 of 5 positions shown; findings below may reference images not displayed]

FINDINGS: Frontal, bilateral oblique, lateral views of the cervical spine are
obtained. There is slight straightening of the cervical spine which
could be positional. Otherwise alignment is anatomic. No acute
fracture. Disc spaces are well preserved. Neural foramina are widely
patent. Prevertebral soft tissues are normal. Lung apices are clear.
IMPRESSION: 1. Straightening of the cervical spine, which could be positional or
due to muscle spasm.
2. Otherwise unremarkable exam.

## 2019-06-29 IMAGING — DX DG LUMBAR SPINE COMPLETE 4+V
5 series · 5 of 5 positions shown · non-contrast
Comparison: [DATE]

CLINICAL DATA: Left-sided lower back pain with left-sided
radiculopathy

EXAM:
LUMBAR SPINE - COMPLETE 4+ VIEW

[l-spine ap]
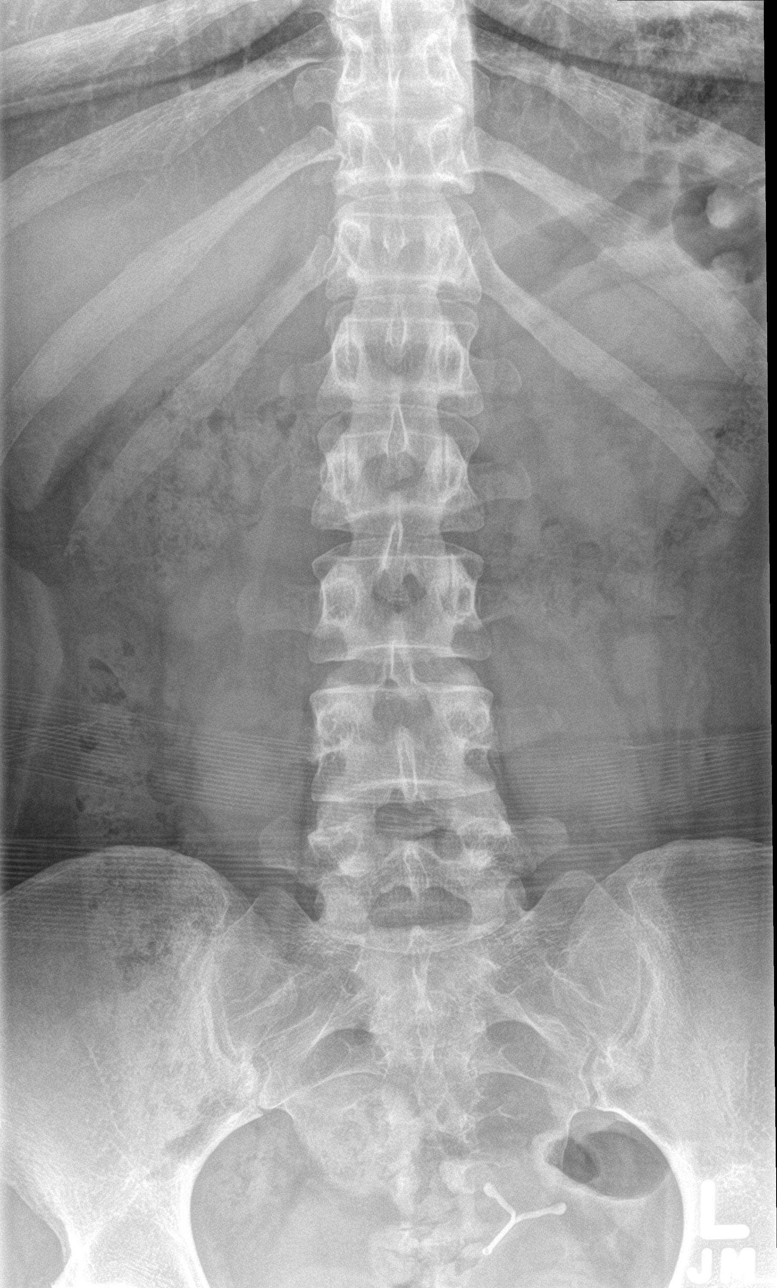

[l-spine obl (1 of 2)]
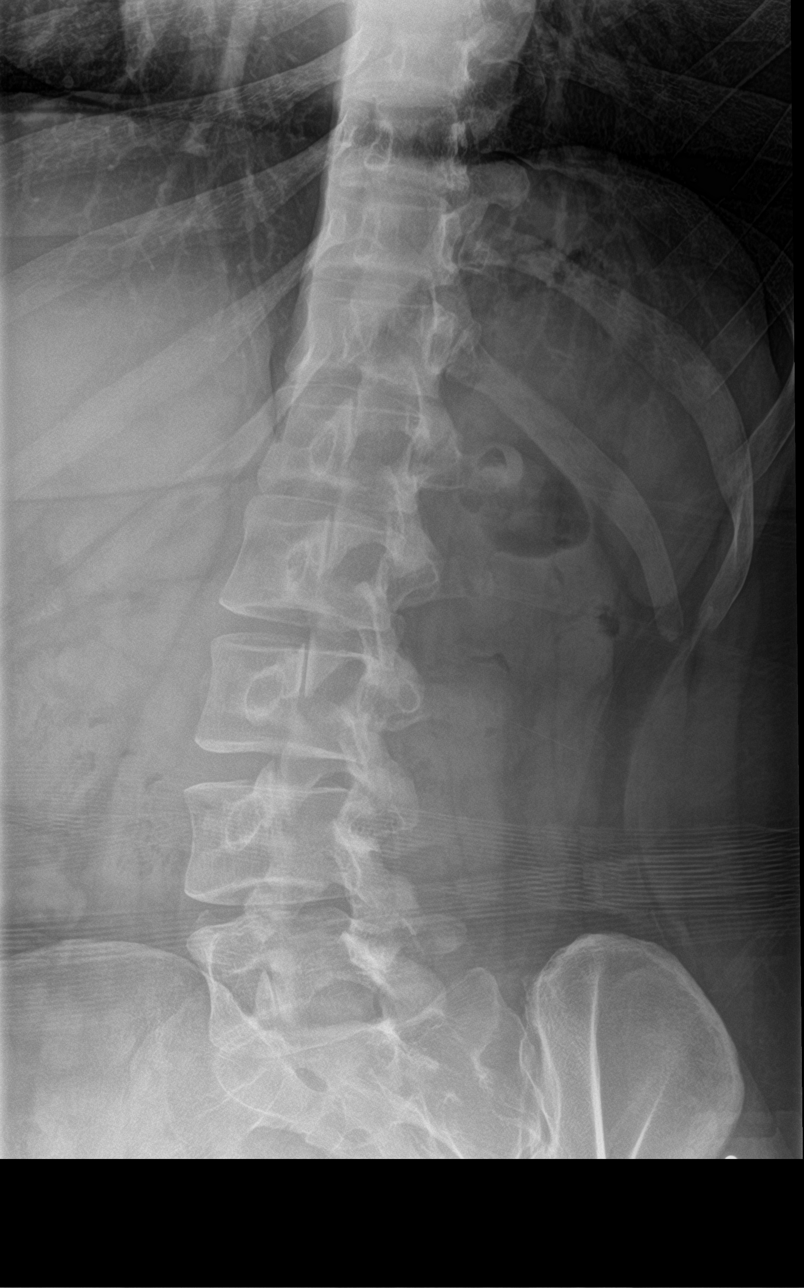

[l-spine obl (2 of 2)]
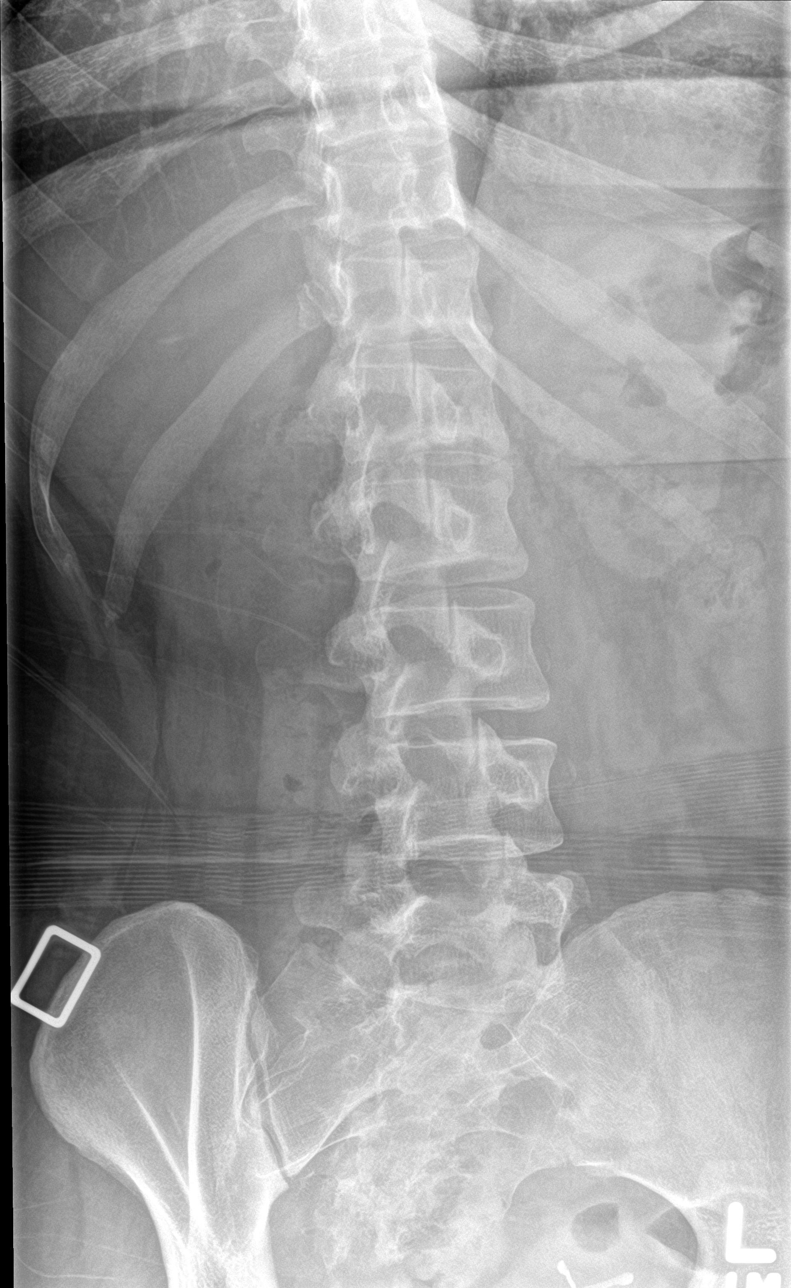

[l-spine lat]
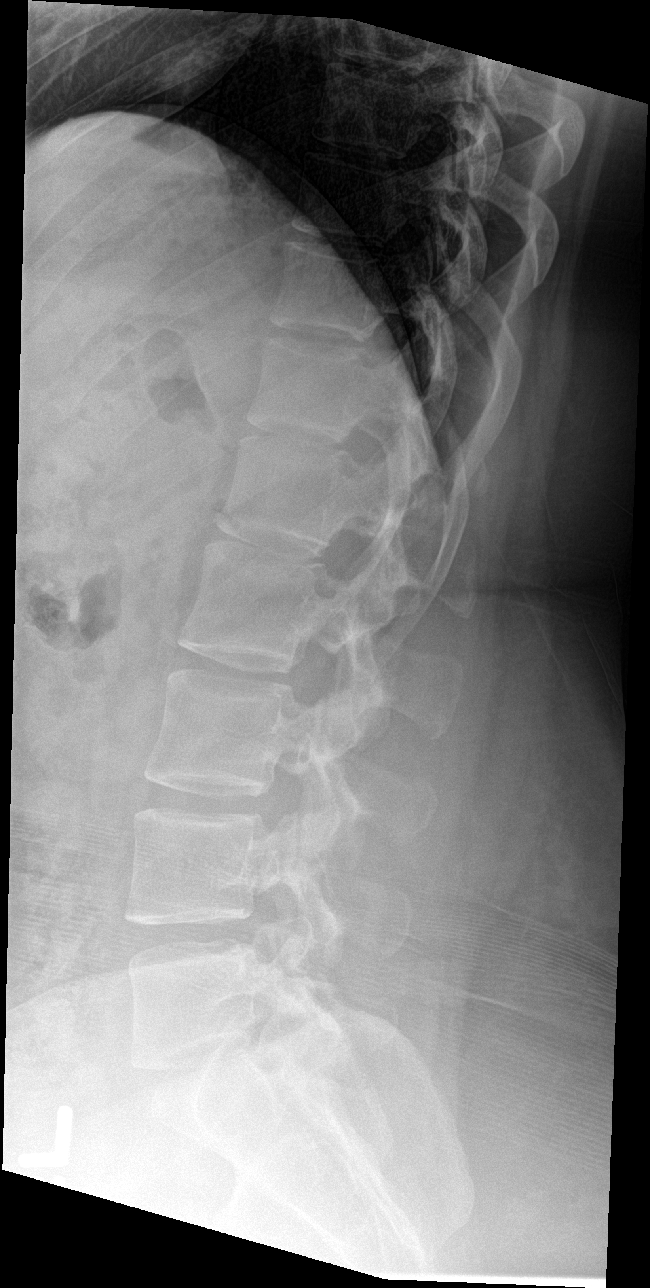

[l-spine spot]
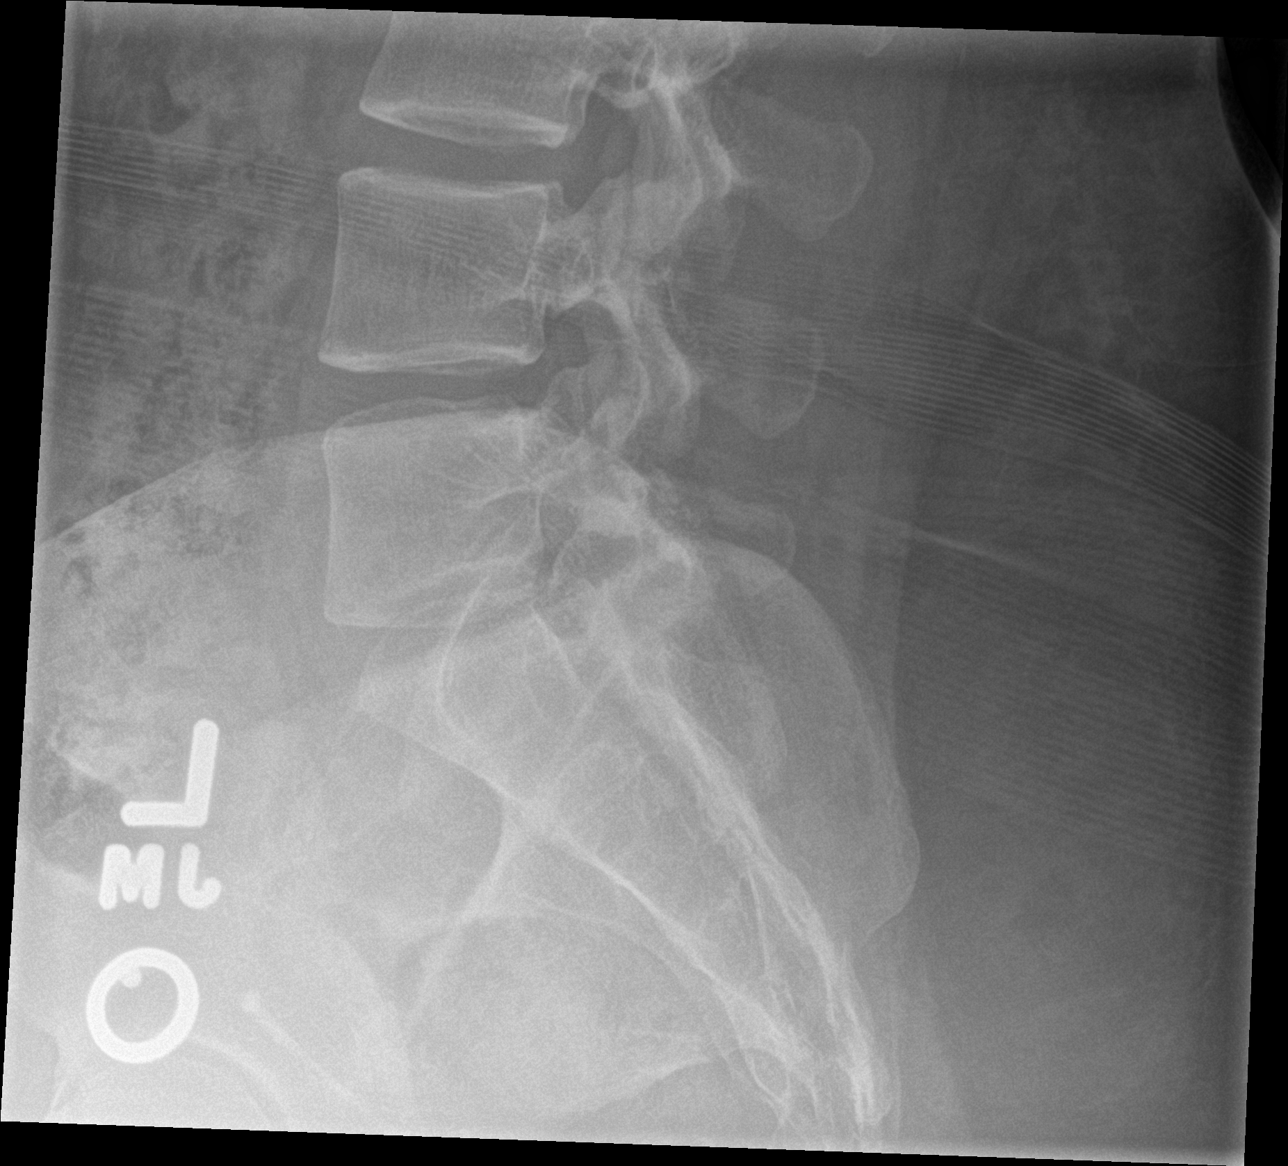

[5 of 5 positions shown; findings below may reference images not displayed]

FINDINGS: Frontal, bilateral oblique, and lateral views of the lumbar spine
are obtained. There are 5 non-rib-bearing lumbar type vertebral
bodies in grossly normal anatomic alignment. No acute displaced
fracture. Disc spaces are well preserved. Sacroiliac joints are
normal. IUD is seen within the pelvis.
IMPRESSION: 1. Unremarkable lumbar spine.

## 2019-06-29 MED ORDER — PREDNISONE 20 MG PO TABS: 20.0000 mg | ORAL_TABLET | Freq: Two times a day (BID) | ORAL | 0 refills | Status: DC

## 2019-06-29 MED ORDER — GABAPENTIN 100 MG PO CAPS: 100.0000 mg | ORAL_CAPSULE | Freq: Three times a day (TID) | ORAL | 0 refills | Status: DC

## 2019-06-29 NOTE — Progress Notes (Addendum)
Acute Office Visit  Subjective:    Patient ID: Ashlee Newton, female    DOB: [birth year], 24 y.o.   MRN: [id number]  CC: Back and neck pain  HPI Ashlee Newton is a pleasant 24 year old female presenting today with issues of head, neck, and back pain.   CHRONIC LUMBAR PAIN  Chronic lower back pain that has been present for several years.  She denies any known injury.  She reports the pain is consistent with how it has been in the past.  She reports the pain is bilateral.  She reports new onset of sharp radiating pain on the left side from the buttocks down to the heel with walking when the left foot strikes the ground.  She first noticed this while walking her dog 2 to 3 days ago and reports the symptoms are not constant.  She has been taking ibuprofen without relief.  She reports she has been dealing with this pain however now is experiencing additional symptoms in her upper back and into her neck.   THORACIC PAIN Ashlee Newton reports acute onset of left-sided thoracic back pain approximately 5-6/10 with a cramping/dull sensation.  She reports sudden onset of this pain approximately 3 weeks ago.  She reports the pain is isolated to the left side of her back but does radiate around to the rib cages.  She has tried stretching, ibuprofen, heat, ice, and muscle relaxers without any relief.  She reports movement exacerbates the pain.  She reports occasional radiation of the pain down into the left foot and up into the base of the neck behind the left ear.  Denies any known injury to the area.  She denies change in her pillow or mattress.  CERVICAL PAIN Ashlee Newton reports new onset of cervical neck pain with headaches.  She reports the headaches started a couple months ago and occur almost daily.  She reports they started around the left eye initially but now she is experiencing pain onto the left side of her neck and behind her left ear.  She is unsure if the 2 headaches are related.  She does report an MRI a few months  ago and seeing an ophthalmologist for the orbital headaches, but there was no resolution to her symptoms.  She reports her headache is a 6-7/10.  She also reports new onset of burning/tingling pain in the left arm that she noticed for several hours yesterday.  She has also taken ibuprofen, heat, ice, rest, and muscle relaxant for this pain but it is not helping.  She reports movement with her neck causes an increase in symptoms and nothing seems to make it better.  She does report a history of chronic sinus issues related to her allergies and the pain she is experiencing with this is completely different.    Past Medical History:  Diagnosis Date  . Allergies   . Allergy   . Migraine     Past Surgical History:  Procedure Laterality Date  . TONSILLECTOMY AND ADENOIDECTOMY    . TYMPANOSTOMY TUBE PLACEMENT      Family History  Problem Relation Age of Onset  . Migraines Mother   . Hypercholesterolemia Mother   . Hypertension Father   . Cancer Maternal Grandfather        pancreatitis --> pancreatic CA    Social History   Socioeconomic History  . Marital status: Single    Spouse name: Not on file  . Number of children: Not on file  . Years of education: Not  on file  . Highest education level: Not on file  Occupational History  . Occupation: Museum/gallery conservator  Tobacco Use  . Smoking status: Never Smoker  . Smokeless tobacco: Never Used  Substance and Sexual Activity  . Alcohol use: Yes  . Drug use: No  . Sexual activity: Yes    Partners: Male    Birth control/protection: Pill  Other Topics Concern  . Not on file  Social History Narrative  . Not on file   Social Determinants of Health   Financial Resource Strain:   . Difficulty of Paying Living Expenses: Not on file  Food Insecurity:   . Worried About Programme researcher, broadcasting/film/video in the Last Year: Not on file  . Ran Out of Food in the Last Year: Not on file  Transportation Needs:   . Lack of Transportation (Medical): Not on file  .  Lack of Transportation (Non-Medical): Not on file  Physical Activity:   . Days of Exercise per Week: Not on file  . Minutes of Exercise per Session: Not on file  Stress:   . Feeling of Stress : Not on file  Social Connections:   . Frequency of Communication with Friends and Family: Not on file  . Frequency of Social Gatherings with Friends and Family: Not on file  . Attends Religious Services: Not on file  . Active Member of Clubs or Organizations: Not on file  . Attends Banker Meetings: Not on file  . Marital Status: Not on file  Intimate Partner Violence:   . Fear of Current or Ex-Partner: Not on file  . Emotionally Abused: Not on file  . Physically Abused: Not on file  . Sexually Abused: Not on file    Outpatient Medications Prior to Visit  Medication Sig Dispense Refill  . levocetirizine (XYZAL) 5 MG tablet Take by mouth.    . montelukast (SINGULAIR) 10 MG tablet Take by mouth.    . SUMAtriptan (IMITREX) 50 MG tablet Take 1 tablet (50 mg total) by mouth every 2 (two) hours as needed for migraine. May repeat in 2 hours if headache persists or recurs. 12 tablet 0  . diclofenac sodium (VOLTAREN) 1 % GEL Apply 4 g topically 4 (four) times daily. To affected joint. (Patient not taking: Reported on 06/25/2019) 100 g 11  . Ibuprofen-Famotidine 800-26.6 MG TABS Take 1 tablet by mouth 3 (three) times daily. (Patient not taking: Reported on 06/25/2019) 90 tablet 3  . ipratropium (ATROVENT) 0.06 % nasal spray Place 2 sprays into both nostrils every 4 (four) hours as needed. (Patient not taking: Reported on 06/25/2019) 10 mL 6  . naproxen (NAPROSYN) 500 MG tablet Take 1 tablet (500 mg total) by mouth 2 (two) times daily. (Patient not taking: Reported on 01/08/2019) 14 tablet 0  . NIKKI 3-0.02 MG tablet TAKE 1 TABLET BY MOUTH EVERY DAY (Patient not taking: Reported on 06/25/2019) 28 tablet 0  . omeprazole (PRILOSEC) 40 MG capsule Take 1 capsule (40 mg total) by mouth daily. (Patient not  taking: Reported on 06/25/2019) 30 capsule 3  . ondansetron (ZOFRAN ODT) 4 MG disintegrating tablet Take 1 tablet (4 mg total) by mouth every 8 (eight) hours as needed for nausea or vomiting. (Patient not taking: Reported on 06/29/2019) 12 tablet 0  . triamcinolone cream (KENALOG) 0.1 % Apply 1 application topically 2 (two) times daily. (Patient not taking: Reported on 06/25/2019) 30 g 0   No facility-administered medications prior to visit.    Allergies  Allergen  Reactions  . Contrast Media [Iodinated Diagnostic Agents] Rash    Review of Systems  Constitutional: Negative for fever.  HENT: Positive for ear pain, hearing loss and sinus pressure. Negative for congestion.   Eyes: Negative for photophobia, pain, discharge and visual disturbance.  Respiratory: Negative for chest tightness and shortness of breath.   Cardiovascular: Negative for chest pain.  Gastrointestinal: Negative for abdominal distention and abdominal pain.  Genitourinary: Negative for flank pain and frequency.       No loss of bowel or bladder control.  Musculoskeletal: Positive for back pain, myalgias, neck pain and neck stiffness.  Skin: Negative for rash and wound.  Allergic/Immunologic: Positive for environmental allergies.  Neurological: Positive for weakness and headaches. Negative for light-headedness and numbness.       Objective:    Physical Exam Vitals and nursing note reviewed.  Constitutional:      Appearance: Normal appearance.  HENT:     Head: Normocephalic. No right periorbital erythema or left periorbital erythema.     Jaw: There is normal jaw occlusion. No trismus, tenderness, swelling or pain on movement.     Comments: Mild reported tenderness on palpation in the left periorbital region, the left temporal region, left mandible, and behind the left ear.    Right Ear: No tenderness.     Left Ear: Tenderness present.  Eyes:     General: Lids are normal. Gaze aligned appropriately.     Extraocular  Movements: Extraocular movements intact.     Conjunctiva/sclera: Conjunctivae normal.     Pupils: Pupils are equal, round, and reactive to light.  Neck:     Thyroid: No thyroid mass or thyromegaly.  Cardiovascular:     Rate and Rhythm: Normal rate.     Pulses: Normal pulses.  Pulmonary:     Effort: Pulmonary effort is normal.  Abdominal:     General: There is no distension.     Tenderness: There is no abdominal tenderness. There is no right CVA tenderness or left CVA tenderness.  Musculoskeletal:        General: Tenderness present. No swelling.     Cervical back: Rigidity and tenderness present. No edema or erythema. Pain with movement and spinous process tenderness present. Decreased range of motion.     Thoracic back: Tenderness and bony tenderness present. No swelling or deformity. Decreased range of motion.     Lumbar back: Tenderness and bony tenderness present. No swelling. Decreased range of motion. Positive left straight leg raise test. Negative right straight leg raise test.     Right lower leg: No edema.     Left lower leg: No edema.  Lymphadenopathy:     Cervical: No cervical adenopathy.  Skin:    General: Skin is warm and dry.  Neurological:     General: No focal deficit present.     Mental Status: She is alert and oriented to person, place, and time.  Psychiatric:        Mood and Affect: Mood normal.        Behavior: Behavior normal. Behavior is cooperative.   Decreased spinal range of motion with lateral bending and twisting L>R. Decreased cervical range of motion with lateral bending L>R Decreased upper and lower extremity strength noted L>R No decreased sensation noted to the face, head, neck, shoulders, back, arms, or legs.   BP 127/83 (BP Location: Left Arm, Patient Position: Sitting, Cuff Size: Large)   Pulse 79   Temp 98.2 F (36.8 C) (Oral)   Wt  224 lb 1.3 oz (101.6 kg)   LMP 06/22/2019   BMI 38.46 kg/m  Wt Readings from Last 3 Encounters:  06/29/19  224 lb 1.3 oz (101.6 kg)  06/25/19 223 lb (101.2 kg)  02/15/19 220 lb (99.8 kg)    There are no preventive care reminders to display for this patient.  There are no preventive care reminders to display for this patient.   Lab Results  Component Value Date   TSH 0.98 06/25/2019   Lab Results  Component Value Date   WBC 3.9 (A) 12/07/2012   HGB 12.7 12/07/2012   HCT 39.8 12/07/2012   MCV 90.3 12/07/2012   Lab Results  Component Value Date   NA 138 12/07/2012   K 3.9 12/07/2012   CO2 25 12/07/2012   GLUCOSE 74 12/07/2012   BUN 9 12/07/2012   CREATININE 0.81 12/07/2012   BILITOT 0.4 12/07/2012   ALKPHOS 53 12/07/2012   AST 20 12/07/2012   ALT 23 12/07/2012   PROT 7.1 12/07/2012   ALBUMIN 4.3 12/07/2012   CALCIUM 9.0 12/07/2012   No results found for: CHOL No results found for: HDL No results found for: LDLCALC No results found for: TRIG No results found for: CHOLHDL No results found for: UXLK4M     Assessment & Plan:   1. Acute left-sided thoracic back pain Thoracic pain most correlate with a radicular nature.  Previous thoracic spine x-rays from 2017 reviewed-questionable mild scoliosis present.  Will obtain x-rays today to rule out acute processes.   Prescription for 7-day prednisone burst to help with inflammation and as needed gabapentin to help with the nerve pain, most specifically at sleep.  Handouts with gentle exercises for cervical and lumbar pain provided. We will follow up on x-ray results and touch base with patient in approximately 1 week to determine if symptoms have improved. If no improvement of symptoms and no defined cause from x-ray are found, will consider further imaging including an MRI of the spine.  Patient may benefit from additional physical therapy. - predniSONE (DELTASONE) 20 MG tablet; Take 1 tablet (20 mg total) by mouth 2 (two) times daily with a meal.  Dispense: 7 tablet; Refill: 0 - gabapentin (NEURONTIN) 100 MG capsule; Take 1  capsule (100 mg total) by mouth 3 (three) times daily.  Dispense: 90 capsule; Refill: 0 - DG Thoracic Spine 2 View  2. Cervical radiculitis Patient symptoms and presentation with just correlate with exacerbation of cervical cervical radiculitis.  It is highly likely this is also causing the patient's left-sided headaches.  Occipital neuralgia may be present as well. X-ray of the cervical spine today to rule out any bony abnormalities or changes to the cervical spine.  Prescription for prednisone burst for 7 days to help reduce inflammation and gabapentin PR in to help with nerve type pain and headaches.  Instructions for gentle neck stretching and exercises provided. We will follow up on x-rays and touch base with patient in approximately 1 week to determine if symptoms have improved. If no symptom improvement and no defined cause from x-ray, consider MRI of the neck and spine.  Patient may benefit from additional physical therapy. - predniSONE (DELTASONE) 20 MG tablet; Take 1 tablet (20 mg total) by mouth 2 (two) times daily with a meal.  Dispense: 7 tablet; Refill: 0 - gabapentin (NEURONTIN) 100 MG capsule; Take 1 capsule (100 mg total) by mouth 3 (three) times daily.  Dispense: 90 capsule; Refill: 0 - DG Cervical Spine Complete  3.  Lumbar radiculopathy Symptoms and presentation most correlate with exacerbation of lumbar radiculopathy which the patient has been dealing with for quite some time.  I suspect the pain in her upper back may have exacerbated her symptoms. X-ray of the lumbar spine today.  Previous x-rays reviewed with questionable scoliosis present.  We will look into this further with results.  Prescription for prednisone burst for 7 days and as needed gabapentin for nerve type pain provided.  Patient provided with gentle stretches and exercises for the lower back. We will follow up on x-rays and touch base with the patient in approximately 7 days to determine improvement of  symptoms. If no improvement in the next week, further imaging may be required.  Will consider MRI of the neck and spine.  Patient may also benefit from additional physical therapy.  - predniSONE (DELTASONE) 20 MG tablet; Take 1 tablet (20 mg total) by mouth 2 (two) times daily with a meal.  Dispense: 7 tablet; Refill: 0 - gabapentin (NEURONTIN) 100 MG capsule; Take 1 capsule (100 mg total) by mouth 3 (three) times daily.  Dispense: 90 capsule; Refill: 0 - DG Lumbar Spine Complete  Will follow-up with patient in approximately one week. If not improvement, will need to be re-evaluated at that time and consider further imaging.   Tollie Eth, NP

## 2019-06-29 NOTE — Patient Instructions (Addendum)
We will a prednisone burst to see how this helps with you back pain  Take gabapentin at bedtime to help with nighttime pain  Work on gentle stretching exercises

## 2019-06-30 LAB — CYTOLOGY - PAP
Chlamydia: POSITIVE — AB
Comment: NEGATIVE
Comment: NEGATIVE
Comment: NORMAL
Diagnosis: NEGATIVE
Neisseria Gonorrhea: NEGATIVE
Trichomonas: NEGATIVE

## 2019-06-30 MED ORDER — AZITHROMYCIN 500 MG PO TABS: 1000.0000 mg | ORAL_TABLET | Freq: Once | ORAL | 0 refills | Status: AC

## 2019-06-30 NOTE — Addendum Note (Signed)
Addended by: Sharyon Cable on: 06/30/2019 04:05 PM   Modules accepted: Orders

## 2019-07-01 ENCOUNTER — Encounter: Payer: Self-pay | Admitting: Nurse Practitioner

## 2019-07-01 ENCOUNTER — Other Ambulatory Visit: Payer: Self-pay | Admitting: Nurse Practitioner

## 2019-07-01 DIAGNOSIS — M5412 Radiculopathy, cervical region: Secondary | ICD-10-CM

## 2019-07-01 DIAGNOSIS — M5416 Radiculopathy, lumbar region: Secondary | ICD-10-CM

## 2019-07-01 NOTE — Progress Notes (Signed)
Orders for PT placed for treatment and management of cervical, thoracic, and lumbar spinal pain.

## 2019-07-06 ENCOUNTER — Encounter: Payer: Self-pay | Admitting: Nurse Practitioner

## 2019-07-07 ENCOUNTER — Ambulatory Visit (INDEPENDENT_AMBULATORY_CARE_PROVIDER_SITE_OTHER): Payer: BC Managed Care – PPO | Admitting: Rehabilitative and Restorative Service Providers"

## 2019-07-07 ENCOUNTER — Other Ambulatory Visit: Payer: Self-pay

## 2019-07-07 DIAGNOSIS — M6281 Muscle weakness (generalized): Secondary | ICD-10-CM | POA: Diagnosis not present

## 2019-07-07 DIAGNOSIS — M5440 Lumbago with sciatica, unspecified side: Secondary | ICD-10-CM | POA: Diagnosis not present

## 2019-07-07 DIAGNOSIS — M542 Cervicalgia: Secondary | ICD-10-CM

## 2019-07-07 DIAGNOSIS — R293 Abnormal posture: Secondary | ICD-10-CM | POA: Diagnosis not present

## 2019-07-07 NOTE — Patient Instructions (Signed)
Access Code: [email or web address]  URL: [email or web address]  Date: 07/07/2019  Prepared by: Margretta Ditty   Exercises Child's Pose Stretch - 1 reps - 1 sets - 30 seconds hold - 2x daily - 7x weekly Child's Pose with Sidebending - 2 reps - 1 sets - 30 seconds hold - 2x daily - 7x weekly Supine Thoracic Mobilization Towel Roll Vertical with Arm Stretch - 1 reps - 1 sets - 60 seconds hold - 2x daily - 7x weekly Supine Thoracic Mobilization Towel Roll Horizontal - 1 reps - 1 sets - 60 seconds hold - 2x daily - 7x weekly Gentle Levator Scapulae Stretch - 2 reps - 1 sets - 20 seconds hold - 2x daily - 7x weekly

## 2019-07-07 NOTE — Therapy (Signed)
[facility] [address] [place], [place], [address] Phone: [phone]   Fax:  [phone]  Physical Therapy Evaluation  Patient Details  Name: Ashlee Newton MRN: [id number] Date of Birth: [birth year] Referring Provider (PT): Enid Skeens, NP   Encounter Date: 07/07/2019  PT End of Session - 07/07/19 1226    Visit Number  1    Number of Visits  12    Date for PT Re-Evaluation  08/18/19    PT Start Time  1018    PT Stop Time  1102    PT Time Calculation (min)  44 min    Activity Tolerance  Patient tolerated treatment well    Behavior During Therapy  [facility] for tasks assessed/performed       Past Medical History:  Diagnosis Date  . Allergies   . Allergy   . Migraine     Past Surgical History:  Procedure Laterality Date  . TONSILLECTOMY AND ADENOIDECTOMY    . TYMPANOSTOMY TUBE PLACEMENT      There were no vitals filed for this visit.   Subjective Assessment - 07/07/19 1019    Subjective  The patient had onset of low back pain in 2017; she tried physical therapy but it did not help.  She has assumed it is related to her work at a [employer].  At this time she describes mid back pain that radiates laterally.  Mid to low back pain hurts when she stops and stands or bends the wrong way (doesn't use her knees).  Pain is described as mild but rated at 5-6/10 level.  She reports once it begins, it stays with her until bedtime.  NECK:  Pain worse on the right side with radiating pain to the shoulder and on the left side is SCM pain and upper cervical tension that leads to a HA and radiates into her upper back.  The neck pain began 2 weeks ago. She notes some hand pain on the L side in wrist and MCP region (not to fingertips).   Turning to the right aggravates pain and pain is present all day.    Pertinent History  allergies, migraines (describes pain in her left eye that is unexplained per patient)--- L  eye pain present 2 days/week    Diagnostic tests  x-rays demonstrate some cervical straightening    Patient Stated Goals  if pain is less    Currently in Pain?  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    Multiple Pain Sites  Yes    Pain Score  5    Pain Location  Neck    Pain Orientation  Right    Pain Descriptors / Indicators  Tightness;Aching;Sore    Pain Type  Acute pain    Pain Radiating Towards  R shoulder and L hand    Pain Onset  1 to 4 weeks ago    Pain Frequency  Intermittent  Aggravating Factors   right rotation    Pain Relieving Factors  laser use at work         [facility] PT Assessment - 07/07/19 1029      Assessment   Medical Diagnosis  cervical radiculitis; lumbar radiculopathy    Referring Provider (PT)  Enid Skeens, NP    Onset Date/Surgical Date  07/01/19    Hand Dominance  Right    Prior Therapy  for her low back in 2017      Precautions   Precautions  None      Restrictions   Weight Bearing Restrictions  No      Balance Screen   Has the patient fallen in the past 6 months  No    Has the patient had a decrease in activity level because of a fear of falling?   No    Is the patient reluctant to leave their home because of a fear of falling?   No      Home Environment   Living Environment  Private residence    Living Arrangements  Other relatives   sister   Type of Home  House    Home Access  Stairs to enter    Entrance Stairs-Number of Steps  4    Home Layout  One level      Prior Function   Level of Independence  Independent      Observation/Other Assessments   Focus on  Therapeutic Outcomes (FOTO)   63% (37% limitation--low back survey performed)      Sensation   Light Touch  Impaired Detail    Light Touch Impaired Details  --   tingling into L foot at times     Posture/Postural Control   Posture/Postural Control  Postural limitations    Postural Limitations  Rounded Shoulders;Forward head;Decreased thoracic kyphosis      ROM / Strength   AROM / PROM / Strength  AROM;Strength      AROM   Overall AROM   Deficits    AROM Assessment Site  Cervical;Lumbar    Cervical Flexion  45    Cervical Extension  35    Cervical - Right Side Bend  45    Cervical - Left Side Bend  48    Cervical - Right Rotation  70    Cervical - Left Rotation  70    Lumbar Flexion  25% limited    with mid back pain   Lumbar Extension  25% limited   mid back pain   Lumbar - Right Side Bend  WNLs    Lumbar - Left Side Bend  WNLs    Lumbar - Right Rotation  25% limited   R mid back pain   Lumbar - Left Rotation  25% limited      Strength   Overall Strength  Deficits    Strength Assessment Site  Hip;Knee;Ankle    Right/Left Hip  Right;Left    Right Hip Flexion  4-/5    Left Hip Flexion  4-/5    Right/Left Knee  Right;Left    Right Knee Flexion  5/5    Right Knee Extension  5/5    Left Knee Flexion  5/5    Left Knee Extension  5/5    Right/Left Ankle  Right;Left    Right Ankle Dorsiflexion  5/5    Left Ankle Dorsiflexion  5/5      Flexibility   Soft Tissue Assessment /Muscle Length  yes  tightness bilat upper trap and scalenes     Palpation   Spinal mobility  Patient has significant pain with light touch along her back and with all PA mobility with hypomobility most noted mid lumbar region, mid thoracic, and upper thoracic region.  C-spine is hypomobile mid to low c-spine for PA and lateral glides                Objective measurements completed on examination: See above findings.      [facility] Adult PT Treatment/Exercise - 07/07/19 1029      Exercises    Exercises  Neck;Lumbar      Neck Exercises: Seated   Other Seated Exercise  Levator Scapulae stretching in sitting      Lumbar Exercises: Stretches   Quadruped Mid Back Stretch  30 seconds    Quadruped Mid Back Stretch Limitations  child's pose and then sidebending child's pose    Other Lumbar Stretch Exercise  thoracic extension with arms abducted x 2 minutes      Lumbar Exercises: Quadruped   Madcat/Old Horse  5 reps    Madcat/Old Horse Limitations  pain with this movement             PT Education - 07/07/19 1217    Education Details  HEP initiated    [employer]) Educated  Patient    Methods  Explanation;Demonstration;Handout    Comprehension  Returned demonstration;Verbalized understanding          PT Long Term Goals - 07/07/19 1228      PT LONG TERM GOAL #1   Title  The patient will be independent with HEP.    Time  6    Period  Weeks    Target Date  08/18/19      PT LONG TERM GOAL #2   Title  The patient will reduce limitation per FOTO from 37% to < or equal to 25%.    Time  6    Period  Weeks    Target Date  08/18/19      PT LONG TERM GOAL #3   Title  The patient will report neck pain < or equal to 2/10 at rest.    Time  6    Period  Weeks    Target Date  08/18/19      PT LONG TERM GOAL #4   Title  The patient will report low back pain < or equal to 2/10 at rest.    Time  6    Period  Weeks    Target Date  08/18/19      PT LONG TERM GOAL #5   Title  The patient will return demo correct body mechanics for lifting >50 lbs for work.    Time  6    Period  Weeks    Target Date  08/18/19             Plan - 07/07/19 1059    Clinical Impression Statement  The patient is a 24 yo female presenting with h/o chronic low back pain with recent worsening of back pain to involve mid back symptoms and then neck pain that began 2 weeks ago.  She presents with impairments of weakness bilat hip flexors, hypomobility lumbar spine PA, hypomobility thoracic  spine PA, hypomobility c spine PA and lateral glides, postural tightness, tenderness to palpation bilateral scalenes and upper trapezius musculature.  Impairments lead to decreased ability to perform IADLs and work related tasks.  PT to address deficits  to reduce pain and improve mobility.    Personal Factors and Comorbidities  Time since onset of injury/illness/exacerbation    Examination-Activity Limitations  Lift;Squat    Stability/Clinical Decision Making  Stable/Uncomplicated    Clinical Decision Making  Low    Rehab Potential  Good    PT Frequency  2x / week    PT Duration  6 weeks    PT Treatment/Interventions  ADLs/Self Care Home Management;Gait training;Functional mobility training;Therapeutic activities;Therapeutic exercise;Neuromuscular re-education;Manual techniques;Dry needling;Taping;Patient/family education;Electrical Stimulation;Traction;Iontophoresis 4mg /ml Dexamethasone;Ultrasound;Moist Heat;Cryotherapy    PT Next Visit Plan  work on improving mobility t/o spine (PA mobilization and c-spine PA + lateral glides), gentle extension activities (painful prone on elbows today in Low back), core engagement, first rib mobilization/depression stretch, check on progress with HEP.  May benefit from lumbar traction.    PT Home Exercise Plan  Access Code: [email or web address]    Consulted and Agree with Plan of Care  Patient       Patient will benefit from skilled therapeutic intervention in order to improve the following deficits and impairments:  Postural dysfunction, Impaired flexibility, Hypomobility, Improper body mechanics, Decreased strength, Decreased range of motion, Pain  Visit Diagnosis: Cervicalgia  Low back pain with sciatica, sciatica laterality unspecified, unspecified back pain laterality, unspecified chronicity  Muscle weakness (generalized)  Abnormal posture     Problem List Patient Active Problem List   Diagnosis Date Noted  . Acute pain of right shoulder 12/22/2018  .  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 Cervical radiculitis 12/29/2015  . Lumbar radiculopathy 12/29/2015  . Seasonal allergies 07/04/2011  . Irregular menses 07/04/2011  . Headache(784.0) 07/04/2011    Martin Smeal, PT 07/07/2019, 12:35 PM  [facility] [address] [place], [place], [address] Phone: [phone]   Fax:  [phone]  Name: Ashlee Newton MRN: [id number] Date of Birth: 1996-02-11

## 2019-07-13 ENCOUNTER — Ambulatory Visit (INDEPENDENT_AMBULATORY_CARE_PROVIDER_SITE_OTHER): Payer: BC Managed Care – PPO | Admitting: Rehabilitative and Restorative Service Providers"

## 2019-07-13 ENCOUNTER — Other Ambulatory Visit: Payer: Self-pay

## 2019-07-13 DIAGNOSIS — M5440 Lumbago with sciatica, unspecified side: Secondary | ICD-10-CM | POA: Diagnosis not present

## 2019-07-13 DIAGNOSIS — R293 Abnormal posture: Secondary | ICD-10-CM | POA: Diagnosis not present

## 2019-07-13 DIAGNOSIS — M25561 Pain in right knee: Secondary | ICD-10-CM

## 2019-07-13 DIAGNOSIS — M542 Cervicalgia: Secondary | ICD-10-CM | POA: Diagnosis not present

## 2019-07-13 DIAGNOSIS — M6281 Muscle weakness (generalized): Secondary | ICD-10-CM | POA: Diagnosis not present

## 2019-07-13 DIAGNOSIS — G8929 Other chronic pain: Secondary | ICD-10-CM

## 2019-07-13 NOTE — Therapy (Signed)
[facility] [address] [place], [place], [address] Phone: [phone]   Fax:  [phone]  Physical Therapy Treatment  Patient Details  Name: Ashlee Newton MRN: [id number] Date of Birth: 08-31-1995 Referring Provider (PT): Enid Skeens, NP   Encounter Date: 07/13/2019  PT End of Session - 07/13/19 0807    Visit Number  2    Number of Visits  12    Date for PT Re-Evaluation  08/18/19    PT Start Time  0802    PT Stop Time  0853    PT Time Calculation (min)  51 min    Activity Tolerance  Patient tolerated treatment well    Behavior During Therapy  [facility] for tasks assessed/performed       Past Medical History:  Diagnosis Date  . Allergies   . Allergy   . Migraine     Past Surgical History:  Procedure Laterality Date  . TONSILLECTOMY AND ADENOIDECTOMY    . TYMPANOSTOMY TUBE PLACEMENT      There were no vitals filed for this visit.  Subjective Assessment - 07/13/19 0806    Subjective  The patient notes minimal pain today (none in back and R side neck mild today).    Pertinent History  allergies, migraines (describes pain in her left eye that is unexplained per patient)--- L eye pain present 2 days/week    Diagnostic tests  x-rays demonstrate some cervical straightening    Patient Stated Goals  if pain is less    Currently in Pain?  Yes    Pain Score  0-No pain    Pain Location  Back    Pain Onset  More than a month ago    Pain Score  4    Pain Location  Neck    Pain Orientation  Right    Pain Descriptors / Indicators  Aching;Tightness    Pain Type  Acute pain    Pain Onset  1 to 4 weeks ago    Pain Frequency  Intermittent    Aggravating Factors   right rotation    Pain Relieving Factors  laser at work                       [facility] Adult PT Treatment/Exercise - 07/13/19 0814      Exercises   Exercises  Neck;Lumbar      Neck Exercises: Theraband   Scapula Retraction  10 reps;Red      Neck Exercises: Standing   Upper Extremity D2  10 reps;Theraband    Theraband Level (UE D2)  Level 2 (Red)    UE D2 Limitations  cues to avoid hypermobility of shoulder, engage core    Other Standing Exercises  standing scapular retraction red theraband x 15 reps (horizontal abduction/adduction) *see HEP      Neck Exercises: Supine   Neck Retraction  5 reps    Neck Retraction Limitations  tactile cues for posterior lengthening    Capital Flexion  5 reps    Capital Flexion Limitations  5 second holds "lift off the pillow 1 inch and hold" cues      Neck Exercises: Prone   Neck Retraction  10 reps    Neck Retraction Limitations  with tactile guidance for proper technique    Other Prone Exercise  alternating arm reaches while prone on elbows      Lumbar Exercises: Aerobic   Nustep  L5 x 5 minutes for warm  up      Lumbar Exercises: Quadruped   Straight Leg Raise  10 reps;4 seconds    Straight Leg Raises Limitations  cues to avoid trunk rotation      Modalities   Modalities  Electrical Stimulation;Moist Heat      Moist Heat Therapy   Number Minutes Moist Heat  12 Minutes    Moist Heat Location  Cervical;Lumbar Spine      Electrical Stimulation   Electrical Stimulation Location  upper back/neck    Electrical Stimulation Action  interferential    Electrical Stimulation Parameters  to tolerance    Electrical Stimulation Goals  Pain      Manual Therapy   Manual Therapy  Joint mobilization;Soft tissue mobilization    Manual therapy comments  To reduce neck pain and improve joint mobility    Joint Mobilization  PA mobiity upper thoracic spine and c-spine PA and lateral glides.  Grade II-III joint mobility    Soft tissue mobilization  R scalenes, bilateral upper trapezius, and levator STM             PT Education - 07/13/19 0846    Education Details  progressed HEP to add resistance/strengthening    Person(s) Educated  Patient    Methods  Explanation;Demonstration;Handout     Comprehension  Verbalized understanding;Returned demonstration          PT Long Term Goals - 07/07/19 1228      PT LONG TERM GOAL #1   Title  The patient will be independent with HEP.    Time  6    Period  Weeks    Target Date  08/18/19      PT LONG TERM GOAL #2   Title  The patient will reduce limitation per FOTO from 37% to < or equal to 25%.    Time  6    Period  Weeks    Target Date  08/18/19      PT LONG TERM GOAL #3   Title  The patient will report neck pain < or equal to 2/10 at rest.    Time  6    Period  Weeks    Target Date  08/18/19      PT LONG TERM GOAL #4   Title  The patient will report low back pain < or equal to 2/10 at rest.    Time  6    Period  Weeks    Target Date  08/18/19      PT LONG TERM GOAL #5   Title  The patient will return demo correct body mechanics for lifting >50 lbs for work.    Time  6    Period  Weeks    Target Date  08/18/19            Plan - 07/13/19 0847    Clinical Impression Statement  The patient reports less pain this morning in her low back (noting back pain can get worse as she fatigues at work).  She continues with cervical spine pain worse on the right side.  She tolerated strengthening activities today working on improving postural stability. Continue to LTGs.    Personal Factors and Comorbidities  Time since onset of injury/illness/exacerbation    Examination-Activity Limitations  Lift;Squat    Stability/Clinical Decision Making  Stable/Uncomplicated    Rehab Potential  Good    PT Frequency  2x / week    PT Duration  6 weeks    PT Treatment/Interventions  ADLs/Self Care Home Management;Gait training;Functional mobility training;Therapeutic activities;Therapeutic exercise;Neuromuscular re-education;Manual techniques;Dry needling;Taping;Patient/family education;Electrical Stimulation;Traction;Iontophoresis 4mg /ml Dexamethasone;Ultrasound;Moist Heat;Cryotherapy    PT Next Visit Plan  work on improving mobility t/o  spine (PA mobilization and c-spine PA + lateral glides), gentle extension activities (painful prone on elbows today in Low back), core engagement, first rib mobilization/depression stretch, check on progress with HEP.  May benefit from lumbar traction.    PT Home Exercise Plan  Access Code:    Consulted and Agree with Plan of Care  Patient       Patient will benefit from skilled therapeutic intervention in order to improve the following deficits and impairments:  Postural dysfunction, Impaired flexibility, Hypomobility, Improper body mechanics, Decreased strength, Decreased range of motion, Pain  Visit Diagnosis: Cervicalgia  Muscle weakness (generalized)  Low back pain with sciatica, sciatica laterality unspecified, unspecified back pain laterality, unspecified chronicity  Abnormal posture  Chronic pain of right knee     Problem List Patient Active Problem List   Diagnosis Date Noted  . Acute pain of right shoulder 12/22/2018  . 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 Cervical radiculitis 12/29/2015  . Lumbar radiculopathy 12/29/2015  . Seasonal allergies 07/04/2011  . Irregular menses 07/04/2011  . Headache(784.0) 07/04/2011    Dalton Molesworth, PT 07/13/2019, 9:18 AM  [facility] [address] [place], [place], [place] Phone: [phone]   Fax:  [phone]  Name: Ashlee Newton MRN: Beryle Quant Date of Birth: 1996/03/02

## 2019-07-13 NOTE — Patient Instructions (Signed)
Access Code: [email or web address]  URL: [email or web address]  Date: 07/13/2019  Prepared by: Margretta Ditty   Exercises Child's Pose Stretch - 1 reps - 1 sets - 30 seconds hold - 2x daily - 7x weekly Child's Pose with Sidebending - 2 reps - 1 sets - 30 seconds hold - 2x daily - 7x weekly Supine Thoracic Mobilization Towel Roll Vertical with Arm Stretch - 1 reps - 1 sets - 60 seconds hold - 2x daily - 7x weekly Supine Thoracic Mobilization Towel Roll Horizontal - 1 reps - 1 sets - 60 seconds hold - 2x daily - 7x weekly Supine Chin Tuck - 10 reps - 1 sets - 5 seconds hold - 2x daily - 7x weekly Gentle Levator Scapulae Stretch - 2 reps - 1 sets - 20 seconds hold - 2x daily - 7x weekly Standing Shoulder Horizontal and Diagonal Pulls with Resistance                   - 15 reps - 1 sets - 2x daily - 7x weekly

## 2019-07-15 ENCOUNTER — Encounter: Payer: BC Managed Care – PPO | Admitting: Physical Therapy

## 2019-07-20 ENCOUNTER — Other Ambulatory Visit: Payer: Self-pay

## 2019-07-20 ENCOUNTER — Encounter: Payer: Self-pay | Admitting: Physical Therapy

## 2019-07-20 ENCOUNTER — Ambulatory Visit (INDEPENDENT_AMBULATORY_CARE_PROVIDER_SITE_OTHER): Payer: BC Managed Care – PPO | Admitting: Physical Therapy

## 2019-07-20 DIAGNOSIS — M5440 Lumbago with sciatica, unspecified side: Secondary | ICD-10-CM

## 2019-07-20 DIAGNOSIS — M6281 Muscle weakness (generalized): Secondary | ICD-10-CM | POA: Diagnosis not present

## 2019-07-20 DIAGNOSIS — R293 Abnormal posture: Secondary | ICD-10-CM

## 2019-07-20 NOTE — Therapy (Signed)
[facility] [place] [address] [place], [place], [address] Phone: [phone]   Fax:  [phone]  Physical Therapy Treatment  Patient Details  Name: Ashlee Newton MRN: [id number] Date of Birth: 1995/09/05 Referring Provider (PT): Enid Skeens, NP   Encounter Date: 07/20/2019  PT End of Session - 07/20/19 0802    Visit Number  3    Number of Visits  12    Date for PT Re-Evaluation  08/18/19    PT Start Time  0802    PT Stop Time  0849    PT Time Calculation (min)  47 min    Activity Tolerance  Patient tolerated treatment well    Behavior During Therapy  [facility] for tasks assessed/performed       Past Medical History:  Diagnosis Date  . Allergies   . Allergy   . Migraine     Past Surgical History:  Procedure Laterality Date  . TONSILLECTOMY AND ADENOIDECTOMY    . TYMPANOSTOMY TUBE PLACEMENT      There were no vitals filed for this visit.  Subjective Assessment - 07/20/19 0804    Subjective  Pt reports she was at work at desk on Friday and stood up, took one step and her Rt low back had sharp pain.  It then started to make her whole back hurt by end of day.  Nothing helped it resolve.    Pertinent History  allergies, migraines (describes pain in her left eye that is unexplained per patient)--- L eye pain present 2 days/week    Diagnostic tests  x-rays demonstrate some cervical straightening    Patient Stated Goals  if pain is less    Currently in Pain?  Yes    Pain Score  4     Pain Location  Back    Pain Orientation  Right;Lower;Mid    Pain Descriptors / Indicators  Nagging    Pain Onset  More than a month ago    Aggravating Factors   bending wrong at work    Pain Relieving Factors  ?    Pain Onset  1 to 4 weeks ago         [facility] PT Assessment - 07/20/19 0001      Assessment   Medical Diagnosis  cervical radiculitis; lumbar radiculopathy    Referring Provider (PT)  Enid Skeens, NP    Onset Date/Surgical Date   07/01/19    Hand Dominance  Right    Prior Therapy  for her low back in 2017      [facility] Adult PT Treatment/Exercise - 07/20/19 0001      Self-Care   Self-Care  Other Self-Care Comments    Other Self-Care Comments   Pt educated on self massage to back musculature with ball; pt returned demo with cues.       Lumbar Exercises: Stretches   Passive Hamstring Stretch  Right;Left;2 reps;20 seconds    Single Knee to Chest Stretch  Right;Left;1 rep;20 seconds    Lower Trunk Rotation  3 reps;10 seconds   some pain at end range in Lt mid, Rt hip     Lumbar Exercises: Aerobic   Nustep  L5 x 5 (arms/legs) minutes for warm up      Lumbar Exercises: Seated   Other Seated Lumbar Exercises  sit to/from stand with cues for netural spine and core engaged, x ~15 reps, with mirror for feedback.       Lumbar Exercises: Supine   Bridge  10 reps      Lumbar Exercises: Sidelying   Other Sidelying Lumbar Exercises  open book, thoracic rotation x 5 reps each side.       Lumbar Exercises: Quadruped   [place]  5 reps   pain at end range; cues to stay within painfree range   Other Quadruped Lumbar Exercises  childs pose x 20 sec, then repeated with lateral trunk flexion x 20 sec each side      Moist Heat Therapy   Number Minutes Moist Heat  10 Minutes    Moist Heat Location  Lumbar Spine      Electrical Stimulation   Electrical Stimulation Location  mid and lumbar paraspinals.     Electrical Stimulation Action  IFC    Electrical Stimulation Parameters   intensity to tolerance    Electrical Stimulation Goals  Pain             PT Education - 07/20/19 0845    Education Details  TENS info, self massage, body mechanics. -handouts issued; pt verbalized understanding.          PT Long Term Goals - 07/07/19 1228      PT LONG TERM GOAL #1   Title  The patient will be independent with HEP.    Time  6    Period  Weeks    Target Date  08/18/19      PT LONG TERM GOAL #2   Title  The  patient will reduce limitation per FOTO from 37% to < or equal to 25%.    Time  6    Period  Weeks    Target Date  08/18/19      PT LONG TERM GOAL #3   Title  The patient will report neck pain < or equal to 2/10 at rest.    Time  6    Period  Weeks    Target Date  08/18/19      PT LONG TERM GOAL #4   Title  The patient will report low back pain < or equal to 2/10 at rest.    Time  6    Period  Weeks    Target Date  08/18/19      PT LONG TERM GOAL #5   Title  The patient will return demo correct body mechanics for lifting >50 lbs for work.    Time  6    Period  Weeks    Target Date  08/18/19            Plan - 07/20/19 1229    Clinical Impression Statement  Pt reported no change in pain with exercises. However, pt reported increased pain in low back, midway during ascending/descending sit to/from stand exercise.  She increases lumbar lordosis at midway point;  reduced pain when spine is kept more neutral during transitional movements.  Pt would benefit from continued back care/ body mechanics education.  Progressing towards goals.    Personal Factors and Comorbidities  Time since onset of injury/illness/exacerbation    Examination-Activity Limitations  Lift;Squat    Stability/Clinical Decision Making  Stable/Uncomplicated    Rehab Potential  Good    PT Frequency  2x / week    PT Duration  6 weeks    PT Treatment/Interventions  ADLs/Self Care Home Management;Gait training;Functional mobility training;Therapeutic activities;Therapeutic exercise;Neuromuscular re-education;Manual techniques;Dry needling;Taping;Patient/family education;Electrical Stimulation;Traction;Iontophoresis 4mg /ml Dexamethasone;Ultrasound;Moist Heat;Cryotherapy    PT Next Visit Plan  spine stabilization exercises; modalities as needed.  IASTM/STM to lumbar  musculature.    PT Home Exercise Plan  Access Code: [email or web address]    Consulted and Agree with Plan of Care  Patient       Patient will benefit from skilled  therapeutic intervention in order to improve the following deficits and impairments:  Postural dysfunction, Impaired flexibility, Hypomobility, Improper body mechanics, Decreased strength, Decreased range of motion, Pain  Visit Diagnosis: Low back pain with sciatica, sciatica laterality unspecified, unspecified back pain laterality, unspecified chronicity  Muscle weakness (generalized)  Abnormal posture     Problem List Patient Active Problem List   Diagnosis Date Noted  . Acute pain of right shoulder 12/22/2018  . 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 Cervical radiculitis 12/29/2015  . Lumbar radiculopathy 12/29/2015  . Seasonal allergies 07/04/2011  . Irregular menses 07/04/2011  . Headache(784.0) 07/04/2011   Kerin Perna, PTA 07/20/19 12:34 PM  [place], [place], [address] Phone: [phone]   Fax:  [phone]  Name: Ashlee Newton MRN: [id number] Date of Birth: 19-Jun-1995

## 2019-07-20 NOTE — Patient Instructions (Addendum)
TENS UNIT: This is helpful for muscle pain and spasm.   Search and Purchase a TENS 7000 2nd edition at www.tenspros.com. It should be less than $30.     TENS unit instructions: Do not shower or bathe with the unit on Turn the unit off before removing electrodes or batteries If the electrodes lose stickiness add a drop of water to the electrodes after they are disconnected from the unit and place on plastic sheet. If you continued to have difficulty, call the TENS unit company to purchase more electrodes. Do not apply lotion on the skin area prior to use. Make sure the skin is clean and dry as this will help prolong the life of the electrodes. After use, always check skin for unusual red areas, rash or other skin difficulties. If there are any skin problems, does not apply electrodes to the same area. Never remove the electrodes from the unit by pulling the wires. Do not use the TENS unit or electrodes other than as directed. Do not change electrode placement without consultating your therapist or physician. Keep 2 fingers width between each electrode. Wear time ratio is 2:1, on to off times.    For example on for 30 minutes off for 15 minutes and then on for 30 minutes off for 15 minutes  Sleeping on Back  Place pillow under knees. A pillow with cervical support and a roll around waist are also helpful. Copyright  VHI. All rights reserved.  Sleeping on Side Place pillow between knees. Use cervical support under neck and a roll around waist as needed. Copyright  VHI. All rights reserved.   Sleeping on Stomach   If this is the only desirable sleeping position, place pillow under lower legs, and under stomach or chest as needed.  Posture - Sitting   Sit upright, head facing forward. Try using a roll to support lower back. Keep shoulders relaxed, and avoid rounded back. Keep hips level with knees. Avoid crossing legs for long periods. Stand to Sit / Sit to Stand   To sit: Bend  knees to lower self onto front edge of chair, then scoot back on seat. To stand: Reverse sequence by placing one foot forward, and scoot to front of seat. Use rocking motion to stand up.   Work Height and Reach  Ideal work height is no more than 2 to 4 inches below elbow level when standing, and at elbow level when sitting. Reaching should be limited to arm's length, with elbows slightly bent.  Bending  Bend at hips and knees, not back. Keep feet shoulder-width apart.    Posture - Standing   Good posture is important. Avoid slouching and forward head thrust. Maintain curve in low back and align ears over shoul- ders, hips over ankles.  Alternating Positions   Alternate tasks and change positions frequently to reduce fatigue and muscle tension. Take rest breaks. Computer Work   Position work to Art gallery manager. Use proper work and seat height. Keep shoulders back and down, wrists straight, and elbows at right angles. Use chair that provides full back support. Add footrest and lumbar roll as needed.  Getting Into / Out of Car  Lower self onto seat, scoot back, then bring in one leg at a time. Reverse sequence to get out.  Dressing  Lie on back to pull socks or slacks over feet, or sit and bend leg while keeping back straight.    Housework - Sink  Place one foot on ledge of cabinet under  sink when standing at sink for prolonged periods.   Pushing / Pulling  Pushing is preferable to pulling. Keep back in proper alignment, and use leg muscles to do the work.  Deep Squat   Squat and lift with both arms held against upper trunk. Tighten stomach muscles without holding breath. Use smooth movements to avoid jerking.  Avoid Twisting   Avoid twisting or bending back. Pivot around using foot movements, and bend at knees if needed when reaching for articles.  Carrying Luggage   Distribute weight evenly on both sides. Use a cart whenever possible. Do not twist trunk. Move body  as a unit.   Lifting Principles .Maintain proper posture and head alignment. .Slide object as close as possible before lifting. .Move obstacles out of the way. .Test before lifting; ask for help if too heavy. .Tighten stomach muscles without holding breath. .Use smooth movements; do not jerk. .Use legs to do the work, and pivot with feet. .Distribute the work load symmetrically and close to the center of trunk. .Push instead of pull whenever possible.   Ask For Help   Ask for help and delegate to others when possible. Coordinate your movements when lifting together, and maintain the low back curve.  Log Roll   Lying on back, bend left knee and place left arm across chest. Roll all in one movement to the right. Reverse to roll to the left. Always move as one unit. Housework - Sweeping  Use long-handled equipment to avoid stooping.   Housework - Wiping  Position yourself as close as possible to reach work surface. Avoid straining your back.  Laundry - Unloading Wash   To unload small items at bottom of washer, lift leg opposite to arm being used to reach.  [place] close to area to be raked. Use arm movements to do the work. Keep back straight and avoid twisting.     Cart  When reaching into cart with one arm, lift opposite leg to keep back straight.   Getting Into / Out of Bed  Lower self to lie down on one side by raising legs and lowering head at the same time. Use arms to assist moving without twisting. Bend both knees to roll onto back if desired. To sit up, start from lying on side, and use same move-ments in reverse. Housework - Vacuuming  Hold the vacuum with arm held at side. Step back and forth to move it, keeping head up. Avoid twisting.   Laundry - IT consultant so that bending and twisting can be avoided.   Laundry - Unloading Dryer  Squat down to reach into clothes dryer or use a reacher.  Gardening -  Weeding / Probation officer or Kneel. Knee pads may be helpful.

## 2019-07-23 ENCOUNTER — Other Ambulatory Visit: Payer: Self-pay

## 2019-07-23 ENCOUNTER — Ambulatory Visit (INDEPENDENT_AMBULATORY_CARE_PROVIDER_SITE_OTHER): Payer: BC Managed Care – PPO | Admitting: Physical Therapy

## 2019-07-23 ENCOUNTER — Encounter: Payer: Self-pay | Admitting: Physical Therapy

## 2019-07-23 DIAGNOSIS — M6281 Muscle weakness (generalized): Secondary | ICD-10-CM | POA: Diagnosis not present

## 2019-07-23 DIAGNOSIS — M5440 Lumbago with sciatica, unspecified side: Secondary | ICD-10-CM

## 2019-07-23 DIAGNOSIS — R293 Abnormal posture: Secondary | ICD-10-CM

## 2019-07-23 DIAGNOSIS — M542 Cervicalgia: Secondary | ICD-10-CM | POA: Diagnosis not present

## 2019-07-23 NOTE — Therapy (Signed)
[facility] [place], [place], [address] Phone: [phone]   Fax:  [phone]  Physical Therapy Treatment  Patient Details  Name: Ashlee Newton MRN: [id number] Date of Birth: 04-Apr-1996 Referring Provider (PT): Jacolyn Reedy, NP   Encounter Date: 07/23/2019  PT End of Session - 07/23/19 0848    Visit Number  4    Number of Visits  12    Date for PT Re-Evaluation  08/18/19    PT Start Time  0848    PT Stop Time  0926    PT Time Calculation (min)  38 min    Activity Tolerance  Patient tolerated treatment well    Behavior During Therapy  [facility] for tasks assessed/performed       Past Medical History:  Diagnosis Date  . Allergies   . Allergy   . Migraine     Past Surgical History:  Procedure Laterality Date  . TONSILLECTOMY AND ADENOIDECTOMY    . TYMPANOSTOMY TUBE PLACEMENT      There were no vitals filed for this visit.  Subjective Assessment - 07/23/19 0854    Subjective  Pt reports she has been more conscience of how she's moving at work.  Lower back feels "pretty good" today.  She reports she woke up with neck pain.    Patient Stated Goals  pain is less    Currently in Pain?  Yes    Pain Score  4     Pain Location  Neck    Pain Orientation  Right    Pain Descriptors / Indicators  Tightness;Sharp    Aggravating Factors   restraining dogs at work    Pain Relieving Factors  working out.         [facility] PT Assessment - 07/23/19 0001      Assessment   Medical Diagnosis  cervical radiculitis; lumbar radiculopathy    Referring Provider (PT)  Jacolyn Reedy, NP    Onset Date/Surgical Date  07/01/19    Hand Dominance  Right    Prior Therapy  for her low back in 2017       [facility] Adult PT Treatment/Exercise - 07/23/19 0001      Neck Exercises: Standing   Upper Extremity D2  10 reps;Theraband    Theraband Level (UE D2)  Level 2 (Red)    UE D2 Limitations  cues for hand and arm position    Other  Standing Exercises  bilat shoulder ext with red band, core engaged, x 10 reps -3 sec pause.   bilat shoulder horiz abdct with band x 10, bilat shoulder ER with red band x 12 reps     Lumbar Exercises: Aerobic   Nustep  L5 x 5 (arms/legs) minutes for warm up      Lumbar Exercises: Sidelying   Other Sidelying Lumbar Exercises  open book, thoracic rotation x 5 reps each side with red band      Lumbar Exercises: Quadruped   Other Quadruped Lumbar Exercises  childs pose x 20 sec, then repeated with lateral trunk flexion x 20 sec each side          Manual Therapy   Soft tissue mobilization  IASTM to R scalenes, bilateral upper trapezius, and levator STM      Neck Exercises: Stretches   Upper Trapezius Stretch  Right;Left;2 reps;20 seconds    Levator Stretch  Right;2 reps;20 seconds    Other Neck Stretches  mid level doorway stretch x 20  sec x 2 reps                  PT Long Term Goals - 07/07/19 1228      PT LONG TERM GOAL #1   Title  The patient will be independent with HEP.    Time  6    Period  Weeks    Target Date  08/18/19      PT LONG TERM GOAL #2   Title  The patient will reduce limitation per FOTO from 37% to < or equal to 25%.    Time  6    Period  Weeks    Target Date  08/18/19      PT LONG TERM GOAL #3   Title  The patient will report neck pain < or equal to 2/10 at rest.    Time  6    Period  Weeks    Target Date  08/18/19      PT LONG TERM GOAL #4   Title  The patient will report low back pain < or equal to 2/10 at rest.    Time  6    Period  Weeks    Target Date  08/18/19      PT LONG TERM GOAL #5   Title  The patient will return demo correct body mechanics for lifting >50 lbs for work.    Time  6    Period  Weeks    Target Date  08/18/19            Plan - 07/23/19 0903    Clinical Impression Statement  Positive response to change in pt's body mechanics with work.  Pt reported she is a stomach sleeper with head turned Rt; discussed  implications on neck tightness and encouraged moving into other neutral positions.  Pt reported significant reduction of pain in neck with stretches; further elimination with IASTM.  Making good gains towards goals.    Personal Factors and Comorbidities  Time since onset of injury/illness/exacerbation    Examination-Activity Limitations  Lift;Squat    Stability/Clinical Decision Making  Stable/Uncomplicated    Rehab Potential  Good    PT Frequency  2x / week    PT Duration  6 weeks    PT Treatment/Interventions  ADLs/Self Care Home Management;Gait training;Functional mobility training;Therapeutic activities;Therapeutic exercise;Neuromuscular re-education;Manual techniques;Dry needling;Taping;Patient/family education;Electrical Stimulation;Traction;Iontophoresis 4mg /ml Dexamethasone;Ultrasound;Moist Heat;Cryotherapy    PT Next Visit Plan  spine stabilization exercises; modalities as needed.  IASTM/STM to lumbar musculature.    PT Home Exercise Plan  Access Code:    Consulted and Agree with Plan of Care  Patient       Patient will benefit from skilled therapeutic intervention in order to improve the following deficits and impairments:  Postural dysfunction, Impaired flexibility, Hypomobility, Improper body mechanics, Decreased strength, Decreased range of motion, Pain  Visit Diagnosis: Cervicalgia  Low back pain with sciatica, sciatica laterality unspecified, unspecified back pain laterality, unspecified chronicity  Abnormal posture  Muscle weakness (generalized)     Problem List Patient Active Problem List   Diagnosis Date Noted  . Acute pain of right shoulder 12/22/2018  . 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 Cervical radiculitis 12/29/2015  . Lumbar radiculopathy 12/29/2015  . Seasonal allergies 07/04/2011  . Irregular menses 07/04/2011  . Headache(784.0) 07/04/2011   09/01/2011, PTA 07/23/19 9:37 AM  [facility] [address] [place], [place], [place] Phone: [phone]  Fax:  [phone]  Name: Ashlee Newton MRN: [id number] Date of Birth: November 05, 1995

## 2019-07-27 ENCOUNTER — Encounter: Payer: Self-pay | Admitting: Rehabilitative and Restorative Service Providers"

## 2019-07-27 ENCOUNTER — Ambulatory Visit: Payer: BC Managed Care – PPO | Admitting: Certified Nurse Midwife

## 2019-07-27 ENCOUNTER — Other Ambulatory Visit: Payer: Self-pay

## 2019-07-27 ENCOUNTER — Encounter: Payer: Self-pay | Admitting: Certified Nurse Midwife

## 2019-07-27 ENCOUNTER — Ambulatory Visit (INDEPENDENT_AMBULATORY_CARE_PROVIDER_SITE_OTHER): Payer: BC Managed Care – PPO | Admitting: Rehabilitative and Restorative Service Providers"

## 2019-07-27 VITALS — BP 121/79 | HR 84 | Ht 64.0 in | Wt 228.0 lb

## 2019-07-27 DIAGNOSIS — Z113 Encounter for screening for infections with a predominantly sexual mode of transmission: Secondary | ICD-10-CM | POA: Diagnosis not present

## 2019-07-27 DIAGNOSIS — Z30431 Encounter for routine checking of intrauterine contraceptive device: Secondary | ICD-10-CM | POA: Diagnosis not present

## 2019-07-27 DIAGNOSIS — A749 Chlamydial infection, unspecified: Secondary | ICD-10-CM

## 2019-07-27 DIAGNOSIS — M5440 Lumbago with sciatica, unspecified side: Secondary | ICD-10-CM | POA: Diagnosis not present

## 2019-07-27 DIAGNOSIS — M6281 Muscle weakness (generalized): Secondary | ICD-10-CM | POA: Diagnosis not present

## 2019-07-27 DIAGNOSIS — G8929 Other chronic pain: Secondary | ICD-10-CM

## 2019-07-27 DIAGNOSIS — R293 Abnormal posture: Secondary | ICD-10-CM | POA: Diagnosis not present

## 2019-07-27 DIAGNOSIS — M25561 Pain in right knee: Secondary | ICD-10-CM

## 2019-07-27 DIAGNOSIS — M542 Cervicalgia: Secondary | ICD-10-CM | POA: Diagnosis not present

## 2019-07-27 DIAGNOSIS — Z975 Presence of (intrauterine) contraceptive device: Secondary | ICD-10-CM

## 2019-07-27 NOTE — Therapy (Signed)
[facility] [place] [address] [place], [place], [address] Phone: [phone]   Fax:  [phone]  Physical Therapy Treatment  Patient Details  Name: Ashlee Newton MRN: [id number] Date of Birth: 09-09-1995 Referring Provider (PT): Enid Skeens, NP   Encounter Date: 07/27/2019  PT End of Session - 07/27/19 1549    Visit Number  5    Number of Visits  12    Date for PT Re-Evaluation  08/18/19    PT Start Time  0803    PT Stop Time  0845    PT Time Calculation (min)  42 min    Activity Tolerance  Patient tolerated treatment well    Behavior During Therapy  [facility] for tasks assessed/performed       Past Medical History:  Diagnosis Date  . Allergies   . Allergy   . Migraine     Past Surgical History:  Procedure Laterality Date  . TONSILLECTOMY AND ADENOIDECTOMY    . TYMPANOSTOMY TUBE PLACEMENT      There were no vitals filed for this visit.  Subjective Assessment - 07/27/19 0805    Subjective  The patient reports she wakes with 4/10 pain and stiffness and 6/10 in the evening after work when fatigued.  Pain improves with stretching and movement and worsens with sitting.    Pertinent History  allergies, migraines (describes pain in her left eye that is unexplained per patient)--- L eye pain present 2 days/week    Diagnostic tests  x-rays demonstrate some cervical straightening    Currently in Pain?  Yes    Pain Score  4     Pain Location  Neck    Pain Orientation  Medial    Pain Descriptors / Indicators  Tightness;Sore    Pain Type  Chronic pain    Pain Onset  More than a month ago    Pain Frequency  Constant    Aggravating Factors   stiffness in morning    Pain Relieving Factors  movement    Multiple Pain Sites  Yes    Pain Score  4    Pain Location  Back    Pain Orientation  Lower;Medial    Pain Descriptors / Indicators  Aching;Tightness    Pain Type  Chronic pain    Pain Onset  More than a month ago    Pain  Frequency  Intermittent    Aggravating Factors   stiffness after sitting    Pain Relieving Factors  work                       [facility] Adult PT Treatment/Exercise - 07/27/19 0808      Exercises   Exercises  Neck;Lumbar      Neck Exercises: Standing   Other Standing Exercises  overhead lift x 4 lbs bilat with cues lumbar spine x 12 reps      Neck Exercises: Supine   Neck Retraction  5 reps    Cervical Rotation  Right;Left;5 reps    Cervical Rotation Limitations  combined with cervical retraction    Other Supine Exercise  cervical flexion 1" from pillow x 10 reps      Neck Exercises: Sidelying   Lateral Flexion  10 reps;Right;Left    Lateral Flexion Limitations  from one pillow in sidelying      Lumbar Exercises: Aerobic   Tread Mill  Treadmill 2.4 mph x 5 minutes without UE support  Lumbar Exercises: Standing   Functional Squats  10 reps    Functional Squats Limitations  with 8 lbs at chest height working on technique with focus on hip hinging and decreasing lumbar extension    Lifting  From floor    Lifting Limitations  demonstrated mechanics of lifting for work and PT demonstrated avoiding twisting with lifting      Lumbar Exercises: Quadruped   Madcat/Old Horse  5 reps    Madcat/Old Horse Limitations  tactile cues to gain greater ROM lumbar spine into flexion and extension    Opposite Arm/Leg Raise  Right arm/Left leg;Left arm/Right leg;5 reps    Opposite Arm/Leg Raise Limitations  with cues for core stability and to decrease trunk rotation.      Manual Therapy   Manual Therapy  Joint mobilization;Soft tissue mobilization    Joint Mobilization  Patient with mild tenderness mid lumbar PA grade II-III mobs, improved mobility mid thoracic spine without pain (compared to hypomobility at eval), and mild tenderness upper t-spine with grade II-III PA mobs.    Soft tissue mobilization  IASTM to bilateral upper trapezius musculature and STM to bilateral scalenes, L  cervical musculature with guarding today.                  PT Long Term Goals - 07/07/19 1228      PT LONG TERM GOAL #1   Title  The patient will be independent with HEP.    Time  6    Period  Weeks    Target Date  08/18/19      PT LONG TERM GOAL #2   Title  The patient will reduce limitation per FOTO from 37% to < or equal to 25%.    Time  6    Period  Weeks    Target Date  08/18/19      PT LONG TERM GOAL #3   Title  The patient will report neck pain < or equal to 2/10 at rest.    Time  6    Period  Weeks    Target Date  08/18/19      PT LONG TERM GOAL #4   Title  The patient will report low back pain < or equal to 2/10 at rest.    Time  6    Period  Weeks    Target Date  08/18/19      PT LONG TERM GOAL #5   Title  The patient will return demo correct body mechanics for lifting >50 lbs for work.    Time  6    Period  Weeks    Target Date  08/18/19            Plan - 07/27/19 1709    Clinical Impression Statement  The patient continues with daily pain upon waking. PT and patient discussed sleeping positions emphasizing neutral spine.  Patient with increased tightness L cervical musculature today.  Thoracic joint moiblity improved since evaluation and pain has reduced since evaluation (initially point tender with all gentle mobilization PA).  PT to continue working on self mobilization neck to reduce stiffness, night time positioning, strengthening to help with postural support during work activities.  Continue working to [employer].    Personal Factors and Comorbidities  Time since onset of injury/illness/exacerbation    Examination-Activity Limitations  Lift;Squat    Stability/Clinical Decision Making  Stable/Uncomplicated    Rehab Potential  Good    PT Frequency  2x / week  PT Duration  6 weeks    PT Treatment/Interventions  ADLs/Self Care Home Management;Gait training;Functional mobility training;Therapeutic activities;Therapeutic exercise;Neuromuscular  re-education;Manual techniques;Dry needling;Taping;Patient/family education;Electrical Stimulation;Traction;Iontophoresis 4mg /ml Dexamethasone;Ultrasound;Moist Heat;Cryotherapy    PT Next Visit Plan  spine stabilization exercises; modalities as needed.  IASTM/STM to lumbar musculature and cervical musculature, spinal strengthening, joint mobility as needed, self mobilization.    PT Home Exercise Plan  Access Code: [email or web address]    Consulted and Agree with Plan of Care  Patient       Patient will benefit from skilled therapeutic intervention in order to improve the following deficits and impairments:  Postural dysfunction, Impaired flexibility, Hypomobility, Improper body mechanics, Decreased strength, Decreased range of motion, Pain  Visit Diagnosis: Cervicalgia  Low back pain with sciatica, sciatica laterality unspecified, unspecified back pain laterality, unspecified chronicity  Abnormal posture  Muscle weakness (generalized)  Chronic pain of right knee     Problem List Patient Active Problem List   Diagnosis Date Noted  . Acute pain of right shoulder 12/22/2018  . 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 Cervical radiculitis 12/29/2015  . Lumbar radiculopathy 12/29/2015  . Seasonal allergies 07/04/2011  . Irregular menses 07/04/2011  . Headache(784.0) 07/04/2011    [place], PT 07/27/2019, 5:12 PM  [facility] [place] [address] [place], [place], [address] Phone: [phone]   Fax:  [phone]  Name: Ashlee Newton MRN: [id number] Date of Birth: Oct 20, 1995

## 2019-07-27 NOTE — Progress Notes (Signed)
GYNECOLOGY OFFICE VISIT NOTE  History:  24 y.o. G0P0000 here today for IUD string check. Mirena IUD was placed on 06/25/19. She denies any abnormal vaginal discharge, pelvic pain or other concerns. Having no spotting or bleeding. Has not had IC since placement.   Past Medical History:  Diagnosis Date  . Allergies   . Allergy   . Migraine     Past Surgical History:  Procedure Laterality Date  . TONSILLECTOMY AND ADENOIDECTOMY    . TYMPANOSTOMY TUBE PLACEMENT      The following portions of the patient's history were reviewed and updated as appropriate: allergies, current medications, past family history, past medical history, past social history, past surgical history and problem list.   Review of Systems:  Pertinent items noted in HPI and remainder of comprehensive ROS otherwise negative.  Objective:  Physical Exam BP 121/79   Pulse 84   Ht 5\' 4"  (1.626 m)   Wt 228 lb (103.4 kg)   LMP 07/25/2019   BMI 39.14 kg/m  CONSTITUTIONAL: Well-developed, well-nourished female in no acute distress.  HENT:  Normocephalic, atraumatic.  SKIN: Skin is warm and dry. No rash noted. Not diaphoretic. No erythema. No pallor. NEUROLOGIC: Alert and oriented to person, place, and time.  PSYCHIATRIC: Normal mood and affect. Normal behavior. Normal judgment and thought content. CARDIOVASCULAR: Normal heart rate noted RESPIRATORY: Effort and rate normal ABDOMEN: Soft, no distention noted.   PELVIC: Normal appearing external genitalia; normal appearing vaginal mucosa and cervix. IUD string seen. No abnormal discharge noted.    Assessment & Plan:  1. IUD (intrauterine device) in place - IUD in place  - No complaints or concerns   2. Chlamydia infection - Patient request TOC swab to be obtained  - Cervicovaginal ancillary only( [place])  Please refer to After Visit Summary for other counseling recommendations.   Return in about 1 year (around 07/26/2020) for WELL WOMAN VISIT.   09/25/2020, CNM 07/27/2019 10:10 AM

## 2019-07-27 NOTE — Patient Instructions (Signed)
Levonorgestrel intrauterine device (IUD) What is this medicine? LEVONORGESTREL IUD (LEE voe nor jes trel) is a contraceptive (birth control) device. The device is placed inside the uterus by a healthcare professional. It is used to prevent pregnancy. This device can also be used to treat heavy bleeding that occurs during your period. This medicine may be used for other purposes; ask your health care provider or pharmacist if you have questions. COMMON BRAND NAME(S): Kyleena, LILETTA, Mirena, Skyla What should I tell my health care provider before I take this medicine? They need to know if you have any of these conditions:  abnormal Pap smear  cancer of the breast, uterus, or cervix  diabetes  endometritis  genital or pelvic infection now or in the past  have more than one sexual partner or your partner has more than one partner  heart disease  history of an ectopic or tubal pregnancy  immune system problems  IUD in place  liver disease or tumor  problems with blood clots or take blood-thinners  seizures  use intravenous drugs  uterus of unusual shape  vaginal bleeding that has not been explained  an unusual or allergic reaction to levonorgestrel, other hormones, silicone, or polyethylene, medicines, foods, dyes, or preservatives  pregnant or trying to get pregnant  breast-feeding How should I use this medicine? This device is placed inside the uterus by a health care professional. Talk to your pediatrician regarding the use of this medicine in children. Special care may be needed. Overdosage: If you think you have taken too much of this medicine contact a poison control center or emergency room at once. NOTE: This medicine is only for you. Do not share this medicine with others. What if I miss a dose? This does not apply. Depending on the brand of device you have inserted, the device will need to be replaced every 3 to 6 years if you wish to continue using this type  of birth control. What may interact with this medicine? Do not take this medicine with any of the following medications:  amprenavir  bosentan  fosamprenavir This medicine may also interact with the following medications:  aprepitant  armodafinil  barbiturate medicines for inducing sleep or treating seizures  bexarotene  boceprevir  griseofulvin  medicines to treat seizures like carbamazepine, ethotoin, felbamate, oxcarbazepine, phenytoin, topiramate  modafinil  pioglitazone  rifabutin  rifampin  rifapentine  some medicines to treat HIV infection like atazanavir, efavirenz, indinavir, lopinavir, nelfinavir, tipranavir, ritonavir  St. John's wort  warfarin This list may not describe all possible interactions. Give your health care provider a list of all the medicines, herbs, non-prescription drugs, or dietary supplements you use. Also tell them if you smoke, drink alcohol, or use illegal drugs. Some items may interact with your medicine. What should I watch for while using this medicine? Visit your doctor or health care professional for regular check ups. See your doctor if you or your partner has sexual contact with others, becomes HIV positive, or gets a sexual transmitted disease. This product does not protect you against HIV infection (AIDS) or other sexually transmitted diseases. You can check the placement of the IUD yourself by reaching up to the top of your vagina with clean fingers to feel the threads. Do not pull on the threads. It is a good habit to check placement after each menstrual period. Call your doctor right away if you feel more of the IUD than just the threads or if you cannot feel the threads at   all. The IUD may come out by itself. You may become pregnant if the device comes out. If you notice that the IUD has come out use a backup birth control method like condoms and call your health care provider. Using tampons will not change the position of the  IUD and are okay to use during your period. This IUD can be safely scanned with magnetic resonance imaging (MRI) only under specific conditions. Before you have an MRI, tell your healthcare provider that you have an IUD in place, and which type of IUD you have in place. What side effects may I notice from receiving this medicine? Side effects that you should report to your doctor or health care professional as soon as possible:  allergic reactions like skin rash, itching or hives, swelling of the face, lips, or tongue  fever, flu-like symptoms  genital sores  high blood pressure  no menstrual period for 6 weeks during use  pain, swelling, warmth in the leg  pelvic pain or tenderness  severe or sudden headache  signs of pregnancy  stomach cramping  sudden shortness of breath  trouble with balance, talking, or walking  unusual vaginal bleeding, discharge  yellowing of the eyes or skin Side effects that usually do not require medical attention (report to your doctor or health care professional if they continue or are bothersome):  acne  breast pain  change in sex drive or performance  changes in weight  cramping, dizziness, or faintness while the device is being inserted  headache  irregular menstrual bleeding within first 3 to 6 months of use  nausea This list may not describe all possible side effects. Call your doctor for medical advice about side effects. You may report side effects to FDA at 1-800-FDA-1088. Where should I keep my medicine? This does not apply. NOTE: This sheet is a summary. It may not cover all possible information. If you have questions about this medicine, talk to your doctor, pharmacist, or health care provider.  2020 Elsevier/Gold Standard (2018-03-24 13:22:01)  

## 2019-07-28 LAB — CERVICOVAGINAL ANCILLARY ONLY
Chlamydia: NEGATIVE
Comment: NEGATIVE
Comment: NORMAL
Neisseria Gonorrhea: NEGATIVE

## 2019-07-29 ENCOUNTER — Ambulatory Visit (INDEPENDENT_AMBULATORY_CARE_PROVIDER_SITE_OTHER): Payer: BC Managed Care – PPO | Admitting: Rehabilitative and Restorative Service Providers"

## 2019-07-29 ENCOUNTER — Other Ambulatory Visit: Payer: Self-pay

## 2019-07-29 DIAGNOSIS — M5440 Lumbago with sciatica, unspecified side: Secondary | ICD-10-CM

## 2019-07-29 DIAGNOSIS — M542 Cervicalgia: Secondary | ICD-10-CM | POA: Diagnosis not present

## 2019-07-29 DIAGNOSIS — M25561 Pain in right knee: Secondary | ICD-10-CM

## 2019-07-29 DIAGNOSIS — R293 Abnormal posture: Secondary | ICD-10-CM

## 2019-07-29 DIAGNOSIS — M6281 Muscle weakness (generalized): Secondary | ICD-10-CM | POA: Diagnosis not present

## 2019-07-29 DIAGNOSIS — G8929 Other chronic pain: Secondary | ICD-10-CM

## 2019-07-29 NOTE — Therapy (Signed)
[facility] [place] [address] [place], [place], [address] Phone: [phone]   Fax:  [phone]  Physical Therapy Treatment  Patient Details  Name: Ashlee Newton MRN: [id number] Date of Birth: 1995/07/09 Referring Provider (PT): Enid Skeens, NP   Encounter Date: 07/29/2019  PT End of Session - 07/29/19 1023    Visit Number  6    Number of Visits  12    Date for PT Re-Evaluation  08/18/19    PT Start Time  1020    PT Stop Time  1100    PT Time Calculation (min)  40 min    Activity Tolerance  Patient tolerated treatment well    Behavior During Therapy  [facility] for tasks assessed/performed       Past Medical History:  Diagnosis Date  . Allergies   . Allergy   . Migraine     Past Surgical History:  Procedure Laterality Date  . TONSILLECTOMY AND ADENOIDECTOMY    . TYMPANOSTOMY TUBE PLACEMENT      There were no vitals filed for this visit.  Subjective Assessment - 07/29/19 1021    Subjective  The patient reports she is feeling good today and has stiffness in the morning.    Pertinent History  allergies, migraines (describes pain in her left eye that is unexplained per patient)--- L eye pain present 2 days/week    Diagnostic tests  x-rays demonstrate some cervical straightening    Patient Stated Goals  pain is less    Currently in Pain?  Yes    Pain Score  2     Pain Orientation  Right    Pain Score  2    Pain Location  Back    Pain Orientation  Mid    Pain Descriptors / Indicators  Tightness    Pain Type  Chronic pain                       [facility] Adult PT Treatment/Exercise - 07/29/19 1024      Exercises   Exercises  Neck;Lumbar      Neck Exercises: Standing   Neck Retraction  5 reps      Lumbar Exercises: Stretches   Lower Trunk Rotation  1 rep;30 seconds    Lower Trunk Rotation Limitations  knee to chest trunk rotation for stretch R and L sides      Lumbar Exercises: Aerobic   Tread Mill   treadmill 3.0 mph x 3 minutes for warm up.      Lumbar Exercises: Standing   Forward Lunge  Other (comment)    Forward Lunge Limitations  holding forward lunge back hele raised performing small trunk rotations x 10 reps with 5 lb weight towards anterior leg    Other Standing Lumbar Exercises  countertop lean with trunk rotation x 5 reps to each side      Lumbar Exercises: Seated   Hip Flexion on Ball  Strengthening;Right;Left;5 reps    Other Seated Lumbar Exercises  Physioball walkouts into tabletop x 3 reps with cues for core engagement.        Lumbar Exercises: Prone   Other Prone Lumbar Exercises  eight point plank x 8 reps x 5 second holds      Lumbar Exercises: Quadruped   Opposite Arm/Leg Raise  Right arm/Left leg;Left arm/Right leg;5 reps    Opposite Arm/Leg Raise Limitations  cues to avoid rotation and slow pace    Other Quadruped Lumbar  Exercises  trunk rotation with thread the needle    Other Quadruped Lumbar Exercises  primal push up x 5 reps x 10 second holds                  PT Long Term Goals - 07/29/19 1104      PT LONG TERM GOAL #1   Title  The patient will be independent with HEP.    Time  6    Period  Weeks      PT LONG TERM GOAL #2   Title  The patient will reduce limitation per FOTO from 37% to < or equal to 25%.    Time  6    Period  Weeks      PT LONG TERM GOAL #3   Title  The patient will report neck pain < or equal to 2/10 at rest.    Time  6    Period  Weeks    Status  Achieved      PT LONG TERM GOAL #4   Title  The patient will report low back pain < or equal to 2/10 at rest.    Time  6    Period  Weeks    Status  Achieved      PT LONG TERM GOAL #5   Title  The patient will return demo correct body mechanics for lifting >50 lbs for work.    Time  6    Period  Weeks            Plan - 07/29/19 1106    Clinical Impression Statement  The patient has reduced pain today to 2/10 in both neck and back.  PT emphasized  strengthening activities working to engage core and plan to continue to increase load to tolerate demands of work activities.  Patient notes enjoying return to strengthening activities and is interested in finding ways to continue to incoporate in her routine.    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 with good Office manager, STM c-spine and R mid to low thoracic paraspinal    PT Home Exercise Plan  Access Code: [email or web address]    Consulted and Agree with Plan of Care  Patient       Patient will benefit from skilled therapeutic intervention in order to improve the following deficits and impairments:  Postural dysfunction, Impaired flexibility, Hypomobility, Improper body mechanics, Decreased strength, Decreased range of motion, Pain  Visit Diagnosis: Cervicalgia  Low back pain with sciatica, sciatica laterality unspecified, unspecified back pain laterality, unspecified chronicity  Abnormal posture  Muscle weakness (generalized)  Chronic pain of right knee     Problem List Patient Active Problem List   Diagnosis Date Noted  . Acute pain of right shoulder 12/22/2018  . 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 Cervical radiculitis 12/29/2015  . Lumbar radiculopathy 12/29/2015  . Seasonal allergies 07/04/2011  . Irregular menses 07/04/2011  . Headache(784.0) 07/04/2011    [place], [place] 07/29/2019, 11:08 AM  [facility] [place], [place], [address] Phone: [phone]    Fax:  [phone]  Name: HYLA COARD MRN: [id number] Date of Birth: 12-17-1995

## 2019-07-30 ENCOUNTER — Encounter: Payer: BC Managed Care – PPO | Admitting: Physical Therapy

## 2019-08-03 ENCOUNTER — Encounter: Payer: BC Managed Care – PPO | Admitting: Rehabilitative and Restorative Service Providers"

## 2019-08-06 ENCOUNTER — Encounter: Payer: BC Managed Care – PPO | Admitting: Rehabilitative and Restorative Service Providers"

## 2019-08-12 ENCOUNTER — Other Ambulatory Visit: Payer: Self-pay

## 2019-08-12 ENCOUNTER — Ambulatory Visit (INDEPENDENT_AMBULATORY_CARE_PROVIDER_SITE_OTHER): Payer: BC Managed Care – PPO | Admitting: Physical Therapy

## 2019-08-12 ENCOUNTER — Encounter: Payer: Self-pay | Admitting: Physical Therapy

## 2019-08-12 DIAGNOSIS — M5440 Lumbago with sciatica, unspecified side: Secondary | ICD-10-CM | POA: Diagnosis not present

## 2019-08-12 DIAGNOSIS — R293 Abnormal posture: Secondary | ICD-10-CM

## 2019-08-12 DIAGNOSIS — M542 Cervicalgia: Secondary | ICD-10-CM | POA: Diagnosis not present

## 2019-08-12 DIAGNOSIS — M6281 Muscle weakness (generalized): Secondary | ICD-10-CM

## 2019-08-12 NOTE — Therapy (Signed)
[place]  [place], [place], [address] Phone: [phone]   Fax:  [phone]  Physical Therapy Treatment  Patient Details  Name: Ashlee Newton MRN: [id number] Date of Birth: April 11, 1996 Referring Provider (PT): Jacolyn Reedy, NP   Encounter Date: 08/12/2019  PT End of Session - 08/12/19 1147    Visit Number  7    Number of Visits  12    Date for PT Re-Evaluation  08/18/19    PT Start Time  0626    PT Stop Time  1230    PT Time Calculation (min)  43 min    Activity Tolerance  Patient tolerated treatment well;No increased pain    Behavior During Therapy  WFL for tasks assessed/performed       Past Medical History:  Diagnosis Date  . Allergies   . Allergy   . Migraine     Past Surgical History:  Procedure Laterality Date  . TONSILLECTOMY AND ADENOIDECTOMY    . TYMPANOSTOMY TUBE PLACEMENT      There were no vitals filed for this visit.  Subjective Assessment - 08/12/19 1148    Subjective  Pt reports she started walking 2 miles per day with friend, 5 days a week. She complains of low back pain when she is still or after long work day.   Neck feels good.  Interested in strengthening low back/hips.    Pertinent History  allergies, migraines (describes pain in her left eye that is unexplained per patient)--- L eye pain present 2 days/week    Diagnostic tests  x-rays demonstrate some cervical straightening    Patient Stated Goals  pain is less    Currently in Pain?  No/denies    Pain Score  0-No pain         [facility] PT Assessment - 08/12/19 0001      Assessment   Medical Diagnosis  cervical radiculitis; lumbar radiculopathy    Referring Provider (PT)  Jacolyn Reedy, NP    Onset Date/Surgical Date  07/01/19    Hand Dominance  Right    Prior Therapy  for her low back in 2017       [facility] Adult PT Treatment/Exercise - 08/12/19 0001      Lumbar Exercises: Stretches   Passive Hamstring Stretch   Right;Left;1 rep;30 seconds    Double Knee to Chest Stretch  1 rep;20 seconds    Quad Stretch  Right;Left;3 reps;20 seconds      Lumbar Exercises: Aerobic   Tread Mill  treadmill 2.5 mph x 5 minutes for warm up.      Lumbar Exercises: Standing   Lifting  From floor;5 reps    Lifting Weights (lbs)  28   sacks in round laundry basket.    Lifting Limitations  demonstrated from both 1/2 kneeling and deep squat.       Lumbar Exercises: Supine   Other Supine Lumbar Exercises  hooklying, holding green pball- reaching over head with core engaged x 5, then ball pass hands to/from feet x 5 reps.     Other Supine Lumbar Exercises  bridge with feet on green pball x 10;  hooklying with legs on pball, lower trunk rotation with core engaged x 8 reps each direction       Lumbar Exercises: Prone   Other Prone Lumbar Exercises  ball walk out with body on green pball x 2 reps, 5 knee tucks       Lumbar Exercises: Quadruped  Straight Leg Raise  5 reps;2 seconds   each leg, on top of green pball   Opposite Arm/Leg Raise  Right arm/Left leg;Left arm/Right leg;5 reps;3 seconds    Other Quadruped Lumbar Exercises  trunk rotation with thread the needle followed by reaching to ceiling x 5 reps each side.   childs pose x 20 sec x 2 reps, then with lateral trunk flexion x 15 sec each side.     Other Quadruped Lumbar Exercises  primal push up x 4 reps x 10 second holds        PT Long Term Goals - 07/29/19 1104      PT LONG TERM GOAL #1   Title  The patient will be independent with HEP.    Time  6    Period  Weeks      PT LONG TERM GOAL #2   Title  The patient will reduce limitation per FOTO from 37% to < or equal to 25%.    Time  6    Period  Weeks      PT LONG TERM GOAL #3   Title  The patient will report neck pain < or equal to 2/10 at rest.    Time  6    Period  Weeks    Status  Achieved      PT LONG TERM GOAL #4   Title  The patient will report low back pain < or equal to 2/10 at rest.     Time  6    Period  Weeks    Status  Achieved      PT LONG TERM GOAL #5   Title  The patient will return demo correct body mechanics for lifting >50 lbs for work.    Time  6    Period  Weeks            Plan - 08/12/19 1233    Clinical Impression Statement  Pt had difficulty maintaining neutral spine with opp arm/leg in quadruped supported on green physioball. She tolerated all exercises without increase in neck or back pain.  Pt shown variety of core exercises she could incorporate into exercise program; discussed ways to incorporate strengthening into weekly schedule.  Pt was able to lift 28# from floor to waist and waist to cart 3 feet way with good form. Pt has partially met her goals; anticipate possible d/c at next visit.    Rehab Potential  Good    PT Frequency  2x / week    PT Duration  6 weeks    PT Treatment/Interventions  ADLs/Self Care Home Management;Gait training;Functional mobility training;Therapeutic activities;Therapeutic exercise;Neuromuscular re-education;Manual techniques;Dry needling;Taping;Patient/family education;Electrical Stimulation;Traction;Iontophoresis '4mg'$ /ml Dexamethasone;Ultrasound;Moist Heat;Cryotherapy    PT Next Visit Plan  assess goals, FOTO,  d/c vs additional therapy visits.    PT Home Exercise Plan  Access Code: [email or web address]    Consulted and Agree with Plan of Care  Patient       Patient will benefit from skilled therapeutic intervention in order to improve the following deficits and impairments:  Postural dysfunction, Impaired flexibility, Hypomobility, Improper body mechanics, Decreased strength, Decreased range of motion, Pain  Visit Diagnosis: Low back pain with sciatica, sciatica laterality unspecified, unspecified back pain laterality, unspecified chronicity  Cervicalgia  Abnormal posture  Muscle weakness (generalized)     Problem List Patient Active Problem List   Diagnosis Date Noted  . Acute pain of right shoulder 12/22/2018  .  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  Cervical radiculitis 12/29/2015  . Lumbar radiculopathy 12/29/2015  . Seasonal allergies 07/04/2011  . Irregular menses 07/04/2011  . Headache(784.0) 07/04/2011   Kerin Perna, PTA 08/12/19 12:46 PM  [place], [place], [address] Phone: [phone]   Fax:  [phone]  Name: Ashlee Newton MRN: [id number] Date of Birth: [birth year]

## 2019-08-24 ENCOUNTER — Ambulatory Visit (INDEPENDENT_AMBULATORY_CARE_PROVIDER_SITE_OTHER): Payer: BC Managed Care – PPO | Admitting: Rehabilitative and Restorative Service Providers"

## 2019-08-24 ENCOUNTER — Encounter: Payer: Self-pay | Admitting: Rehabilitative and Restorative Service Providers"

## 2019-08-24 ENCOUNTER — Other Ambulatory Visit: Payer: Self-pay

## 2019-08-24 DIAGNOSIS — M5440 Lumbago with sciatica, unspecified side: Secondary | ICD-10-CM | POA: Diagnosis not present

## 2019-08-24 DIAGNOSIS — M6281 Muscle weakness (generalized): Secondary | ICD-10-CM | POA: Diagnosis not present

## 2019-08-24 DIAGNOSIS — R293 Abnormal posture: Secondary | ICD-10-CM | POA: Diagnosis not present

## 2019-08-24 DIAGNOSIS — M542 Cervicalgia: Secondary | ICD-10-CM | POA: Diagnosis not present

## 2019-08-24 NOTE — Patient Instructions (Signed)
Access Code: [email or web address] URL: [email or web address] Date: 08/24/2019 Prepared by: Margretta Ditty  Exercises Seated Cervical Retraction - 2 x daily - 7 x weekly - 10 reps - 1 sets Child's Pose Stretch - 2 x daily - 7 x weekly - 1 reps - 1 sets - 30 seconds hold Child's Pose with Sidebending - 2 x daily - 7 x weekly - 2 reps - 1 sets - 30 seconds hold Plank on Knees - 2 x daily - 7 x weekly - 8-10 reps - 1 sets - 5-10 hold Primal Push Up - 2 x daily - 7 x weekly - 10 reps - 1 sets - 10 seconds hold Supine Thoracic Mobilization Towel Roll Vertical with Arm Stretch - 2 x daily - 7 x weekly - 1 reps - 1 sets - 60 seconds hold Gentle Levator Scapulae Stretch - 2 x daily - 7 x weekly - 2 reps - 1 sets - 20 seconds hold Standing Shoulder Horizontal and Diagonal Pulls with Resistance - 2 x daily - 7 x weekly - 15 reps - 1 sets Ulnar Nerve Flossing - 2 x daily - 7 x weekly - 10 reps - 1 sets Ulnar Nerve Flossing - 2 x daily - 7 x weekly - 5 reps - 1 sets - 20 seconds hold Gastroc Stretch on Wall - 2 x daily - 7 x weekly - 1 sets - 2 reps - 30 seconds hold Standing Soleus Stretch - 2 x daily - 7 x weekly - 1 sets - 2 reps - 30 seconds hold Standing Hip Hinge with Dowel - 2 x daily - 7 x weekly - 1 sets - 10 reps

## 2019-08-24 NOTE — Therapy (Addendum)
[place], [place], [address] Phone: [phone]   Fax:  [phone]  Physical Therapy Treatment and Discharge Summary  Patient Details  Name: Ashlee Newton MRN: [id number] Date of Birth: 09-Mar-1996 Referring Provider (PT): Jacolyn Reedy, NP   Encounter Date: 08/24/2019  PT End of Session - 08/24/19 1137    Visit Number  8    Number of Visits  12    Date for PT Re-Evaluation  08/18/19    PT Start Time  0801    PT Stop Time  0845    PT Time Calculation (min)  44 min    Activity Tolerance  Patient tolerated treatment well;No increased pain    Behavior During Therapy  WFL for tasks assessed/performed       Past Medical History:  Diagnosis Date  . Allergies   . Allergy   . Migraine     Past Surgical History:  Procedure Laterality Date  . TONSILLECTOMY AND ADENOIDECTOMY    . TYMPANOSTOMY TUBE PLACEMENT      There were no vitals filed for this visit.  Subjective Assessment - 08/24/19 0847    Subjective  The patient has some tightness today in her neck.  Her low back was sore last night.    Pertinent History  allergies, migraines (describes pain in her left eye that is unexplained per patient)--- L eye pain present 2 days/week    Patient Stated Goals  pain is less    Currently in Pain?  No/denies    Pain Score  0-No pain    Pain Location  Neck    Multiple Pain Sites  Yes    Pain Score  4    Pain Location  Back    Pain Orientation  Mid    Pain Descriptors / Indicators  Tightness    Pain Type  Chronic pain    Pain Onset  More than a month ago    Pain Frequency  Intermittent    Aggravating Factors   after work, sitting for  long periods    Pain Relieving Factors  movement                       [facility] Adult PT Treatment/Exercise - 08/24/19 0853      Self-Care   Self-Care  Other Self-Care Comments    Other Self-Care Comments   patient and PT discussed plan of care; patient  to continue progressing HEP and f/u as needed.  We discussed improved shoewear for walking and continuation of HEP.       Exercises   Exercises  Lumbar      Lumbar Exercises: Stretches   Gastroc Stretch  Right;Left;1 rep;30 seconds    Gastroc Stretch Limitations  then bent knee for soleou stretch      Lumbar Exercises: Standing   Functional Squats  10 reps    Functional Squats Limitations  with dowel focusing on dec'd thoracic hyperextension     Lifting  From floor    Lifting Weights (lbs)  50    Lifting Limitations  demonstrated good body mechanics    Other Standing Lumbar Exercises  straight leg dead lift x 10 reps with 8 lbs (4lbs bilat)      Lumbar Exercises: Seated   Other Seated Lumbar Exercises  Tall kneeling diagonals for UE with green band for core stabilization.    Other Seated Lumbar Exercises  Tall kneeling with transverse rotation with green  band for resistance      Lumbar Exercises: Supine   Other Supine Lumbar Exercises  Seated on physioball rolling out into table top with 10 second holds and return to sitting x 5 reps.      Lumbar Exercises: Prone   Other Prone Lumbar Exercises  ball walk out with body on green pball x 2 reps, 3 knee tucks       Lumbar Exercises: Quadruped   [place]/Old Horse  5 reps    Opposite Arm/Leg Raise  Right arm/Left leg;Left arm/Right leg;5 reps;3 seconds    Opposite Arm/Leg Raise Limitations  cues to slow speed and hold    Other Quadruped Lumbar Exercises  primal push up x 4 reps x 10 second holds             PT Education - 08/24/19 1137    Education Details  HEP progression    Person(s) Educated  Patient    Methods  Explanation;Demonstration;Handout    Comprehension  Returned demonstration;Verbalized understanding          PT Long Term Goals - 08/24/19 0907      PT LONG TERM GOAL #1   Title  The patient will be independent with HEP.    Time  6    Period  Weeks    Status  Achieved    Target Date  08/18/19      PT  LONG TERM GOAL #2   Title  The patient will reduce limitation per FOTO from 37% to < or equal to 25%.    Baseline  71.3% (29% limited)    Time  6    Period  Weeks    Status  Partially Met      PT LONG TERM GOAL #3   Title  The patient will report neck pain < or equal to 2/10 at rest.    Time  6    Period  Weeks    Status  Achieved      PT LONG TERM GOAL #4   Title  The patient will report low back pain < or equal to 2/10 at rest.    Baseline  *varies, increased today to 3/10, therefore partially met    Time  6    Period  Weeks    Status  Partially Met      PT LONG TERM GOAL #5   Title  The patient will return demo correct body mechanics for lifting >50 lbs for work.    Baseline  Patient able to demonstrate safe technique    Time  6    Period  Weeks    Status  Achieved            Plan - 08/24/19 1140    Clinical Impression Statement  The patient has partially met LTGs.  She continues with intermittent LBP worse when sitting for longer periods or after a long day of work.  The patient has appropriate HEP to continue with post d/c.  We plan to hold current therapy and keep chart open as needed for HEP progression x 1 month.    Rehab Potential  Good    PT Frequency  2x / week    PT Duration  6 weeks    PT Treatment/Interventions  ADLs/Self Care Home Management;Gait training;Functional mobility training;Therapeutic activities;Therapeutic exercise;Neuromuscular re-education;Manual techniques;Dry needling;Taping;Patient/family education;Electrical Stimulation;Traction;Iontophoresis 38m/ml Dexamethasone;Ultrasound;Moist Heat;Cryotherapy    PT Next Visit Plan  on hold-- d/c in 1 month if no need to return.  PT Home Exercise Plan  Access Code: [email or web address]    Consulted and Agree with Plan of Care  Patient       Patient will benefit from skilled therapeutic intervention in order to improve the following deficits and impairments:  Postural dysfunction, Impaired flexibility,  Hypomobility, Improper body mechanics, Decreased strength, Decreased range of motion, Pain  Visit Diagnosis: Low back pain with sciatica, sciatica laterality unspecified, unspecified back pain laterality, unspecified chronicity  Cervicalgia  Abnormal posture  Muscle weakness (generalized)     Problem List Patient Active Problem List   Diagnosis Date Noted  . Acute pain of right shoulder 12/22/2018  . 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 Cervical radiculitis 12/29/2015  . Lumbar radiculopathy 12/29/2015  . Seasonal allergies 07/04/2011  . Irregular menses 07/04/2011  . Headache(784.0) 07/04/2011    PHYSICAL THERAPY DISCHARGE SUMMARY  Visits from Start of Care: 8  Current functional level related to goals / functional outcomes: See goals.   Remaining deficits: Continues with intermittent LBP   Education / Equipment: HEP, lifting technique, posture, general wellness.  Plan: Patient agrees to discharge.  Patient goals were partially met. Patient is being discharged due to the patient's request.  ?????         Thank you for the referral of this patient. Rudell Cobb, MPT  [place] 08/24/2019, 11:43 AM  [facility] [place], [place], [address] Phone: [phone]   Fax:  [phone]  Name: Ashlee Newton MRN: [id number] Date of Birth: 08-Jul-1995

## 2019-09-21 ENCOUNTER — Ambulatory Visit (INDEPENDENT_AMBULATORY_CARE_PROVIDER_SITE_OTHER): Payer: BC Managed Care – PPO | Admitting: Osteopathic Medicine

## 2019-09-21 ENCOUNTER — Encounter: Payer: Self-pay | Admitting: Osteopathic Medicine

## 2019-09-21 VITALS — BP 141/62 | HR 77 | Ht 64.17 in | Wt 231.5 lb

## 2019-09-21 DIAGNOSIS — M5416 Radiculopathy, lumbar region: Secondary | ICD-10-CM

## 2019-09-21 DIAGNOSIS — F488 Other specified nonpsychotic mental disorders: Secondary | ICD-10-CM

## 2019-09-21 DIAGNOSIS — M25511 Pain in right shoulder: Secondary | ICD-10-CM | POA: Diagnosis not present

## 2019-09-21 DIAGNOSIS — F411 Generalized anxiety disorder: Secondary | ICD-10-CM

## 2019-09-21 DIAGNOSIS — G8929 Other chronic pain: Secondary | ICD-10-CM

## 2019-09-21 DIAGNOSIS — R4189 Other symptoms and signs involving cognitive functions and awareness: Secondary | ICD-10-CM

## 2019-09-21 NOTE — Progress Notes (Signed)
Shoulder Pain: 1. Pt states July dislocation while hi-fiving her sister. It popped back in. 2. Sunday, 09/19/19 popped out while making forward motion to push.  Shoulder feels increasingly unstable. She can feel it popping regularly but it won't pop out completely. Just clicking.   Physical therapy is not helping. Didn't feel she needed an injection for pain when it was offered previously.   Back Pain: Saw S.E. Early, NP for back pain. Had physical therapy. No improvement. Wanting to know next steps.  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  She just pushes through it.

## 2019-09-21 NOTE — Progress Notes (Signed)
Ashlee Newton is a 24 y.o. female who presents to  [facility]  today, 09/21/19, seeking care for the following:  Shoulder Pain:  July 2020 dislocation complaint, states felt a pop, ER notes no dislocation on Xray but concern for possible RC tear, pt may have dislocated and spontaneously reduced  Saw Dr Denyse Amass 12/2018 and he did injection 01/2019 for MRI contrast, never had intraarticular steroid and not interested in that at this time   MRI: 02/15/2019: 1. Low-grade partial-thickness articular surface tear of the posterior aspect of the infraspinatus tendon. No full-thickness rotator cuff tear. 2. Heterogeneous appearance of the anteroinferior labrum suggesting sequela of prior injury.  Sunday, 09/19/19 popped out while making forward motion to push.  Shoulder feels increasingly unstable. She can feel it popping regularly but it won't pop out completely. Just clicking.   Physical therapy is not helping. Didn't feel she needed an injection for pain when it was offered previously.   Back Pain:  Saw S.E. Early, NP for back pain. Had physical therapy. No improvement. 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   She just pushes through it.   Brain fog is biggest complaint      ASSESSMENT & PLAN with other pertinent history/findings:  The primary encounter diagnosis was Chronic right shoulder pain. Diagnoses of Brain fog, Anxiety state, and Lumbar radiculopathy were also pertinent to this visit.  Ortho - shoulder primarily, we may consider MRI lumbar Consider anxiety treatment, mental health most likely cause of brain fog but would have low threshold for getting labs. Pt reports poor diet, work on getting her nutrients!      Orders Placed This Encounter  Procedures  . Ambulatory referral to Orthopedic Surgery        Follow-up instructions: Return for RECHECK PENDING ORTHO EVAL / IF WORSE OR CHANGE.                                         BP (!) 141/62 (BP Location: Left Arm, Patient Position: Sitting, Cuff Size: Large)   Pulse 77   Ht 5' 4.17" (1.63 m)   Wt 231 lb 8 oz (105 kg)   SpO2 100%   BMI 39.52 kg/m   Current Meds  Medication Sig  . gabapentin (NEURONTIN) 100 MG capsule Take 1 capsule (100 mg total) by mouth 3 (three) times daily.  . Ibuprofen-Famotidine 800-26.6 MG TABS Take 1 tablet by mouth 3 (three) times daily.  Marland Kitchen levocetirizine (XYZAL) 5 MG tablet Take by mouth.  . levonorgestrel (MIRENA) 20 MCG/24HR IUD 1 each by Intrauterine route once. Mirena IUD placed 06/25/2019  . montelukast (SINGULAIR) 10 MG tablet Take by mouth.  . triamcinolone cream (KENALOG) 0.1 % Apply 1 application topically 2 (two) times daily.    No results found for this or any previous visit (from the past 72 hour(s)).  No results found.  Depression screen [facility] 2/9 09/21/2019 08/06/2018  Decreased Interest 1 0  Down, Depressed, Hopeless 2 0  PHQ - 2 Score 3 0  Altered sleeping 3 0  Tired, decreased energy 3 0  Change in appetite 3 0  Feeling bad or failure about yourself  2 0  Trouble concentrating 2 0  Moving slowly or fidgety/restless  1 0  Suicidal thoughts 1 0  PHQ-9 Score 18 0  Difficult doing work/chores Not difficult at all Not difficult at all    GAD 7 : Generalized Anxiety Score 09/21/2019 08/06/2018  Nervous, Anxious, on Edge 3 0  Control/stop worrying 3 0  Worry too much - different things 3 0  Trouble relaxing 3 0  Restless 3 0  Easily annoyed or irritable 3 0  Afraid - awful might happen 3 0  Total GAD 7 Score 21 0  Anxiety Difficulty Not difficult at all Not difficult at all      All questions at time of visit were answered - patient instructed to contact office with any additional concerns or updates.  ER/RTC precautions were reviewed  with the patient.  Please note: voice recognition software was used to produce this document, and typos may escape review. Please contact Dr. Sheppard Coil for any needed clarifications.   Total encounter time: 30 minutes.

## 2019-09-29 ENCOUNTER — Ambulatory Visit: Payer: BC Managed Care – PPO | Admitting: Orthopaedic Surgery

## 2019-09-29 ENCOUNTER — Other Ambulatory Visit: Payer: Self-pay

## 2019-09-29 DIAGNOSIS — S43431D Superior glenoid labrum lesion of right shoulder, subsequent encounter: Secondary | ICD-10-CM

## 2019-09-29 NOTE — Progress Notes (Signed)
Office Visit Note   Patient: Ashlee Newton           Date of Birth: [birth year]           MRN: [id number] Visit Date: 09/29/2019              Requested by: Sunnie Nielsen, DO [address] [place],  [place] [address] PCP: Sunnie Nielsen, DO   Assessment & Plan: Visit Diagnoses:  1. Glenoid labral tear, right, subsequent encounter     Plan: Based on her clinical exam findings of right shoulder instability and subluxating combined with her previous dislocations and the MRI findings of the anterior inferior labrum of the right shoulder, we are recommending a right shoulder arthroscopy with labral repair.  I feel that this is necessary at this point as the she given the fact that she is continue to be symptomatic with shoulder instability.  I explained in detail what the surgery involves.  We talked about the interoperative and postoperative course as well as the risk and benefits of surgery.  All questions and concerns were answered and addressed.  She is interested in proceeding with surgery in the near future.  We would see her back at 1 week postoperative for suture removal.  Follow-Up Instructions: Return for 1 week post-op.   Orders:  No orders of the defined types were placed in this encounter.  No orders of the defined types were placed in this encounter.     Procedures: No procedures performed   Clinical Data: No additional findings.   Subjective: Chief Complaint  Patient presents with  . 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  She reports that she originally dislocated the shoulder back at age 66 and did have to have it reduced in the emergency room.  Last year she had a similar episode working with dogs where she felt the shoulder come out of place.  She was seen in the emergency room but ended up not having a dislocation.  I was able to review those  x-rays.  Even last week she had an episode of the shoulder almost coming out of place.  She does try to keep the shoulder from different positions where she knows that it will feel like it subluxating or sliding out of place.  She denies any numbness and tingling in her right arm and denies any neck issues.  She has been having some low back pain with numbness and tingling down her left leg.  She is actively in physical therapy for her spine and has had physical therapy on her right shoulder.  A MRI arthrogram of that shoulder was obtained last year which did show significant blunting of the anterior-inferior labrum suggesting a chronic tear.  There was no flap component that could be seen.  She has no other acute medical issues.  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  She has never had surgery on her right shoulder before.  HPI  Review of Systems She currently denies any headache, chest pain, shortness of breath, fever, chills, nausea, vomiting  Objective: Vital Signs: There were no vitals taken for this visit.  Physical Exam She is alert and orient x3 and in no acute distress Ortho Exam Examination of her left shoulder is normal.  Examination of her symptomatic right shoulder does show positive apprehension  test and I can feel the humeral head sliding anterior as I tried to abduct and more extreme externally rotate the shoulder so I stopped doing that on my exam.  Her abduction strength is strong. Specialty Comments:  No specialty comments available.  Imaging: No results found. The right shoulder MRI arthrogram does show some chronic changes the anterior inferior labrum suggesting a chronic tear.  PMFS History: Patient Active Problem List   Diagnosis Date Noted  . Glenoid labral tear, right, subsequent encounter 09/29/2019  . Acute pain of right shoulder 12/22/2018  . 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 Cervical  radiculitis 12/29/2015  . Lumbar radiculopathy 12/29/2015  . Seasonal allergies 07/04/2011  . Irregular menses 07/04/2011  . Headache(784.0) 07/04/2011   Past Medical History:  Diagnosis Date  . Allergies   . Allergy   . Migraine     Family History  Problem Relation Age of Onset  . Migraines Mother   . Hypercholesterolemia Mother   . Hypertension Father   . Cancer Maternal Grandfather        pancreatitis --> pancreatic CA    Past Surgical History:  Procedure Laterality Date  . TONSILLECTOMY AND ADENOIDECTOMY    . TYMPANOSTOMY TUBE PLACEMENT     Social History   Occupational History  . Occupation: Camera operator  Tobacco Use  . Smoking status: Never Smoker  . Smokeless tobacco: Never Used  Substance and Sexual Activity  . Alcohol use: Yes  . Drug use: No  . Sexual activity: Yes    Partners: Male    Birth control/protection: I.U.D.

## 2019-10-21 ENCOUNTER — Encounter: Payer: Self-pay | Admitting: Orthopaedic Surgery

## 2019-10-21 ENCOUNTER — Other Ambulatory Visit: Payer: Self-pay | Admitting: Orthopaedic Surgery

## 2019-10-21 DIAGNOSIS — S43491D Other sprain of right shoulder joint, subsequent encounter: Secondary | ICD-10-CM | POA: Diagnosis not present

## 2019-10-21 MED ORDER — HYDROCODONE-ACETAMINOPHEN 5-325 MG PO TABS: 1.0000 | ORAL_TABLET | ORAL | 0 refills | Status: DC | PRN

## 2019-10-28 ENCOUNTER — Encounter: Payer: Self-pay | Admitting: Orthopaedic Surgery

## 2019-10-28 ENCOUNTER — Other Ambulatory Visit: Payer: Self-pay

## 2019-10-28 ENCOUNTER — Ambulatory Visit (INDEPENDENT_AMBULATORY_CARE_PROVIDER_SITE_OTHER): Payer: BC Managed Care – PPO | Admitting: Orthopaedic Surgery

## 2019-10-28 DIAGNOSIS — Z9889 Other specified postprocedural states: Secondary | ICD-10-CM

## 2019-10-28 MED ORDER — ONDANSETRON 4 MG PO TBDP: 4.0000 mg | ORAL_TABLET | Freq: Three times a day (TID) | ORAL | 0 refills | Status: DC | PRN

## 2019-10-28 NOTE — Progress Notes (Signed)
HPI Ashlee Newton returns today 1 week status post right shoulder arthroscopy with labral repair.  She is overall doing well she stopped taking pain medication because the hydrocodone made her feel nauseated and dizzy.  She still somewhat nauseated anytime she eats.  She had no fevers chills.  Right shoulder: Port sites are well approximated with interrupted nylon sutures no signs of infection.  She has full range of motion of the right elbow forearm wrist and hand.  Sensation grossly intact throughout the hand.  Impression: 1 week status post right shoulder arthroscopy with arthroscopic assisted labral repair.  Plan: She will come out of the sling for gentle range of motion of the elbow forearm wrist and hand.  Did send in some Zofran for her to take for the nausea.  Should follow-up with [place] in 4 weeks.  She can come out of the sling just for gentle range of motion baby.  Sutures removed.  Follow-up 4 weeks

## 2019-11-02 ENCOUNTER — Other Ambulatory Visit: Payer: Self-pay | Admitting: Orthopaedic Surgery

## 2019-11-02 ENCOUNTER — Encounter: Payer: Self-pay | Admitting: Orthopaedic Surgery

## 2019-11-02 MED ORDER — METHOCARBAMOL 500 MG PO TABS: 500.0000 mg | ORAL_TABLET | Freq: Four times a day (QID) | ORAL | 1 refills | Status: DC | PRN

## 2019-11-02 MED ORDER — METHYLPREDNISOLONE 4 MG PO TABS: ORAL_TABLET | ORAL | 0 refills | Status: DC

## 2019-11-18 ENCOUNTER — Other Ambulatory Visit: Payer: Self-pay

## 2019-11-18 ENCOUNTER — Ambulatory Visit (INDEPENDENT_AMBULATORY_CARE_PROVIDER_SITE_OTHER): Payer: BC Managed Care – PPO | Admitting: Obstetrics & Gynecology

## 2019-11-18 ENCOUNTER — Other Ambulatory Visit (HOSPITAL_COMMUNITY)
Admission: RE | Admit: 2019-11-18 | Discharge: 2019-11-18 | Disposition: A | Discharge status: Home / Self Care | Payer: BC Managed Care – PPO | Source: Ambulatory Visit | Attending: Obstetrics & Gynecology | Admitting: Obstetrics & Gynecology

## 2019-11-18 ENCOUNTER — Encounter: Payer: Self-pay | Admitting: Obstetrics & Gynecology

## 2019-11-18 VITALS — BP 123/79 | HR 73 | Resp 16 | Ht 64.0 in | Wt 230.0 lb

## 2019-11-18 DIAGNOSIS — N921 Excessive and frequent menstruation with irregular cycle: Secondary | ICD-10-CM | POA: Insufficient documentation

## 2019-11-18 DIAGNOSIS — Z975 Presence of (intrauterine) contraceptive device: Secondary | ICD-10-CM | POA: Insufficient documentation

## 2019-11-18 DIAGNOSIS — B373 Candidiasis of vulva and vagina: Secondary | ICD-10-CM

## 2019-11-18 DIAGNOSIS — B3731 Acute candidiasis of vulva and vagina: Secondary | ICD-10-CM

## 2019-11-18 MED ORDER — CELECOXIB 200 MG PO CAPS: 200.0000 mg | ORAL_CAPSULE | Freq: Every day | ORAL | 1 refills | Status: AC

## 2019-11-18 MED ORDER — DROSPIRENONE-ETHINYL ESTRADIOL 3-0.02 MG PO TABS: 1.0000 | ORAL_TABLET | Freq: Every day | ORAL | 1 refills | Status: DC

## 2019-11-18 NOTE — Progress Notes (Signed)
    GYNECOLOGY OFFICE ENCOUNTER NOTE  History:  24 y.o. G0P0000 here today for today for IUD string check; Mirena  IUD was placed 06/25/2019. Having light bleeding for over 2 weeks associated with cramping and concerned about this. Also worried about not being able to lose weight despite diet and exercise.  No other complaints.  The following portions of the patient's history were reviewed and updated as appropriate: allergies, current medications, past family history, past medical history, past social history, past surgical history and problem list. Last pap smear on 06/25/19 was normal.  Review of Systems:  Pertinent items are noted in HPI.   Objective:  Physical Exam Blood pressure 123/79, pulse 73, resp. rate 16, height 5\' 4"  (1.626 m), weight 230 lb (104.3 kg). CONSTITUTIONAL: Well-developed, well-nourished female in no acute distress.  HENT:  Normocephalic, atraumatic. External right and left ear normal. Oropharynx is clear and moist EYES: Conjunctivae and EOM are normal. Pupils are equal, round, and reactive to light. No scleral icterus.  NECK: Normal range of motion, supple, no masses CARDIOVASCULAR: Normal heart rate noted RESPIRATORY: Effort and breath sounds normal, no problems with respiration noted ABDOMEN: Soft, no distention noted.   PELVIC: Normal appearing external genitalia; normal appearing vaginal mucosa and cervix. Small amount of red-brown discharge seen in vault, no active bleeding.  IUD strings visualized, about 2 cm in length outside cervix.   Assessment & Plan:  Breakthrough bleeding with IUD Patient was told that BTB can happen in first six months of IUD placement.  But will evaluate for other etiologies (history of chlamydia in 05/2019, had negative TOC in 07/2019). Will treat BTB with a combination of NSAIDs and a pack of OCPs.  Will monitor response - celecoxib (CELEBREX) 200 MG capsule; Take 1 capsule (200 mg total) by mouth daily for 7 days.  Dispense: 7  capsule; Refill: 1 - Cervicovaginal ancillary only - drospirenone-ethinyl estradiol (YAZ) 3-0.02 MG tablet; Take 1 tablet by mouth daily.  Dispense: 30 tablet; Refill: 1 Advised that although hormones can cause weight gain, the amount in blood is low and if she is watching her diet and exercising, this should usually not be a problem. They usually stimulate appetite and can promote fat storage at high levels.  Recommended evaluation by PCP if still has no weight loss despite adequate efforts.   Patient can keep Mirena IUD in place for up to seven years; can come in for removal if she desires pregnancy earlier or for any concerning side effects.  Total face-to-face time with patient: 15 minutes. Over 50% of encounter was spent on counseling and coordination of care.   06-13-1996, MD, FACOG Obstetrician & Gynecologist, [facility]

## 2019-11-19 LAB — CERVICOVAGINAL ANCILLARY ONLY
Bacterial Vaginitis (gardnerella): NEGATIVE
Candida Glabrata: NEGATIVE
Candida Vaginitis: POSITIVE — AB
Chlamydia: NEGATIVE
Comment: NEGATIVE
Comment: NEGATIVE
Comment: NEGATIVE
Comment: NEGATIVE
Comment: NEGATIVE
Comment: NORMAL
Neisseria Gonorrhea: NEGATIVE
Trichomonas: NEGATIVE

## 2019-11-19 MED ORDER — FLUCONAZOLE 150 MG PO TABS: 150.0000 mg | ORAL_TABLET | Freq: Once | ORAL | 3 refills | Status: AC

## 2019-11-19 NOTE — Addendum Note (Signed)
Addended by: Jaynie Collins A on: 11/19/2019 06:11 PM   Modules accepted: Orders

## 2019-11-25 ENCOUNTER — Other Ambulatory Visit: Payer: Self-pay

## 2019-11-25 ENCOUNTER — Ambulatory Visit (INDEPENDENT_AMBULATORY_CARE_PROVIDER_SITE_OTHER): Payer: BC Managed Care – PPO | Admitting: Orthopaedic Surgery

## 2019-11-25 ENCOUNTER — Encounter: Payer: Self-pay | Admitting: Orthopaedic Surgery

## 2019-11-25 DIAGNOSIS — Z9889 Other specified postprocedural states: Secondary | ICD-10-CM

## 2019-11-25 NOTE — Progress Notes (Signed)
Ashlee Newton is now between 5 and 6-week status post a right shoulder anterior labral repair.  She is doing well overall.  She has negative apprehension sign on the right shoulder.  That is stiff which is appropriate at this point postoperative.  She has been having some weakness and stiffness in the shoulder.  I agree with her wanted to start physical therapy on the shoulder.  This can be done as an outpatient in [place].  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  All questions and concerns were answered and addressed.  We will see her back in 4 weeks to see how she is doing after course of therapy on her right shoulder.

## 2019-11-26 ENCOUNTER — Telehealth: Payer: Self-pay | Admitting: Orthopaedic Surgery

## 2019-11-26 NOTE — Telephone Encounter (Signed)
done

## 2019-11-26 NOTE — Telephone Encounter (Signed)
[facility] called.   They need the patient's referral switched for PT to OT.   Call back: [phone]

## 2019-12-01 ENCOUNTER — Encounter (HOSPITAL_COMMUNITY): Payer: Self-pay | Admitting: Occupational Therapy

## 2019-12-01 ENCOUNTER — Other Ambulatory Visit: Payer: Self-pay

## 2019-12-01 ENCOUNTER — Ambulatory Visit (HOSPITAL_COMMUNITY): Payer: BC Managed Care – PPO | Attending: Orthopaedic Surgery | Admitting: Occupational Therapy

## 2019-12-01 DIAGNOSIS — M25511 Pain in right shoulder: Secondary | ICD-10-CM

## 2019-12-01 DIAGNOSIS — R29898 Other symptoms and signs involving the musculoskeletal system: Secondary | ICD-10-CM | POA: Insufficient documentation

## 2019-12-01 NOTE — Therapy (Signed)
[facility] [address] [place], [place], [address] Phone: [phone]   Fax:  [phone]  Occupational Therapy Evaluation  Patient Details  Name: Ashlee Newton MRN: [id number] Date of Birth: 1995/08/05 Referring Provider (OT): Dr. Doneen Poisson   Encounter Date: 12/01/2019   OT End of Session - 12/01/19 1155    Visit Number 1    Number of Visits 8    Date for OT Re-Evaluation 12/31/19    Authorization Type BCBS, no visit limit    OT Start Time 1117    OT Stop Time 1140    OT Time Calculation (min) 23 min    Activity Tolerance Patient tolerated treatment well    Behavior During Therapy [facility] for tasks assessed/performed           Past Medical History:  Diagnosis Date   Allergies    Allergy    Migraine     Past Surgical History:  Procedure Laterality Date   labrial repair     TONSILLECTOMY AND ADENOIDECTOMY     TYMPANOSTOMY TUBE PLACEMENT      There were no vitals filed for this visit.   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    Pertinent History Ashlee Newton is a 24 y/o female s/p right anterior labral repair on 10/21/19. Ashlee Newton is experiencing some stiffness and pain in RUE, has been referred to occupational therapy for evaluation and treatment by Dr. Doneen Poisson.    Special Tests Complete FOTO next session    Patient Stated Goals To have less pain and be able to reach further.    Currently in Pain? No/denies             [facility] OT Assessment - 12/01/19 1115      Assessment   Medical Diagnosis s/p right anterior labral repair    Referring Provider (OT) Dr. Doneen Poisson    Onset Date/Surgical Date 10/21/19    Hand Dominance Right    Prior Therapy None      Precautions   Precautions Other (comment)    Precaution Comments no lifting over 30#, avoid er combined with abduction      Restrictions   Weight Bearing Restrictions No       Balance Screen   Has the patient fallen in the past 6 months No    Has the patient had a decrease in activity level because of a fear of falling?  No    Is the patient reluctant to leave their home because of a fear of falling?  No      Prior Function   Level of Independence Independent    Vocation Full time employment    Therapist, art office-lifting animals      ADL   ADL comments Ashlee Newton is having difficulty with reaching overhead and behind back, sleeping, lifting animals.       Written Expression   Dominant Hand Right      Cognition   Overall Cognitive Status Within Functional Limits for tasks assessed      ROM / Strength   AROM / PROM / Strength AROM;PROM;Strength      Palpation   Palpation comment Moderate fascial restrictions upper arm, trapezius, and scapular regions      AROM   Overall AROM Comments Assessed seated, er/IR adducted    AROM Assessment Site Shoulder    Right/Left Shoulder Right  Right Shoulder Flexion 120 Degrees    Right Shoulder ABduction 100 Degrees    Right Shoulder Internal Rotation 90 Degrees    Right Shoulder External Rotation 72 Degrees      PROM   Overall PROM Comments Assessed supine, er/IR adducted    PROM Assessment Site Shoulder    Right/Left Shoulder Right    Right Shoulder Flexion 130 Degrees    Right Shoulder ABduction 105 Degrees    Right Shoulder Internal Rotation 90 Degrees    Right Shoulder External Rotation 40 Degrees      Strength   Overall Strength Comments Assessed seated, er/IR adducted    Strength Assessment Site Shoulder    Right/Left Shoulder Right    Right Shoulder Flexion 4/5    Right Shoulder ABduction 4/5    Right Shoulder Internal Rotation 5/5    Right Shoulder External Rotation 4/5                           OT Education - 12/01/19 1133    Education Details shoulder stretches    Person(s) Educated Patient    Methods Explanation;Demonstration;Handout    Comprehension  Verbalized understanding;Returned demonstration            OT Short Term Goals - 12/01/19 1159      OT SHORT TERM GOAL #1   Title Ashlee Newton will be provided with and educated on HEP to improve mobility in RUE required for ADL completion.    Time 4    Period Weeks    Status New    Target Date 12/31/19      OT SHORT TERM GOAL #2   Title Ashlee Newton will increase RUE A/ROM to WNL to improve mobility required to reach overhead and behind back during dressing and bathing tasks.    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    Time 4    Period Weeks    Status New      OT SHORT TERM GOAL #4   Title Ashlee Newton will decrease RUE pain to 3/10 or less to improve ability to sleep in preferred position.    Time 4    Period Weeks    Status New      OT SHORT TERM GOAL #5   Title Ashlee Newton will decrease RUE fascial restrictions to minimal amounts or less to improve mobility required for functional reaching tasks.    Time 4    Period Weeks    Status New                    Plan - 12/01/19 1156    Clinical Impression Statement A: Ashlee Newton is a 24 y/o female s/p right labral repair on 10/21/19. Ashlee Newton presenting with deficits limiting functional use of RUE as dominant during ADLs and work tasks. 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    OT Occupational Profile and History Problem Focused Assessment - Including review of records relating to presenting problem    Occupational performance deficits (Please refer to evaluation for details): ADL's;IADL's;Rest and Sleep;Work;Leisure    Body Structure / Function / Physical Skills ADL;Endurance;UE functional use;Fascial restriction;Pain;ROM;IADL;Strength    Rehab Potential Good  Clinical Decision Making Limited treatment options, no task modification necessary    Comorbidities  Affecting Occupational Performance: None    Modification or Assistance to Complete Evaluation  No modification of tasks or assist necessary to complete eval    OT Frequency 2x / week    OT Duration 4 weeks    OT Treatment/Interventions Self-care/ADL training;Ultrasound;Patient/family education;Passive range of motion;Cryotherapy;Electrical Stimulation;Moist Heat;Therapeutic exercise;Manual Therapy;Therapeutic activities    Plan P: Ashlee Newton will benefit from skilled OT services to decrease pain and fascial restrictions, increase joint ROM, strength, and functional use of RUE as dominant. Treatment plan: myofascial release and manual techniques, passive stretching, A/ROM, general RUE strengthening, scapular strengthening/stability, modalities prn    OT Home Exercise Plan eval: shoulder stretches    Consulted and Agree with Plan of Care Patient           Patient will benefit from skilled therapeutic intervention in order to improve the following deficits and impairments:   Body Structure / Function / Physical Skills: ADL, Endurance, UE functional use, Fascial restriction, Pain, ROM, IADL, Strength       Visit Diagnosis: Acute pain of right shoulder  Other symptoms and signs involving the musculoskeletal system    Problem List Patient Active Problem List   Diagnosis Date Noted   Status post labral repair of shoulder 10/28/2019   Glenoid labral tear, right, subsequent encounter 09/29/2019   Acute pain of right shoulder 12/22/2018   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 [place], [place], [address] Phone: [phone]   Fax:  [phone]  Name: EMMALIE HAIGH MRN:  [id number] Date of Birth: 27-Aug-1995

## 2019-12-01 NOTE — Patient Instructions (Signed)
  1) Flexion Wall Stretch    Face wall, place affected handon wall in front of you. Slide hand up the wall  and lean body in towards the wall. Hold for 10 seconds. Repeat 3-5 times. 1-2 times/day.     2) Towel Stretch with Internal Rotation      Gently pull up (or to the side) your affected arm  behind your back with the assist of a towel. Hold 10 seconds, repeat 3-5 times. 1-2 times/day.             3) Corner Stretch    Stand at a corner of a wall, place your arms on the walls with elbows bent. Lean into the corner until a stretch is felt along the front of your chest and/or shoulders. Hold for 10 seconds. Repeat 3-5X, 1-2 times/day.    4) Posterior Capsule Stretch    Bring the involved arm across chest. Grasp elbow and pull toward chest until you feel a stretch in the back of the upper arm and shoulder. Hold 10 seconds. Repeat 3-5X. Complete 1-2 times/day.    5) Scapular Retraction    Tuck chin back as you pinch shoulder blades together.  Hold 5 seconds. Repeat 3-5X. Complete 1-2 times/day.    6) External Rotation Stretch:     Place your affected hand on the wall with the elbow bent and gently turn your body the opposite direction until a stretch is felt. Hold 10 seconds, repeat 3-5X. Complete 1-2 times/day.      

## 2019-12-13 ENCOUNTER — Ambulatory Visit (HOSPITAL_COMMUNITY): Payer: BC Managed Care – PPO | Admitting: Occupational Therapy

## 2019-12-13 ENCOUNTER — Encounter (HOSPITAL_COMMUNITY): Payer: Self-pay | Admitting: Occupational Therapy

## 2019-12-13 ENCOUNTER — Other Ambulatory Visit: Payer: Self-pay

## 2019-12-13 DIAGNOSIS — R29898 Other symptoms and signs involving the musculoskeletal system: Secondary | ICD-10-CM

## 2019-12-13 DIAGNOSIS — M25511 Pain in right shoulder: Secondary | ICD-10-CM

## 2019-12-13 NOTE — Therapy (Signed)
[facility] [address] [place], [place], [address] Phone: [phone]   Fax:  [phone]  Occupational Therapy Treatment  Patient Details  Name: Ashlee Newton MRN: [id number] Date of Birth: 02/01/1996 Referring Provider (OT): Dr. Doneen Poisson   Encounter Date: 12/13/2019   OT End of Session - 12/13/19 1754    Visit Number 2    Number of Visits 8    Date for OT Re-Evaluation 12/31/19    Authorization Type BCBS, no visit limit    OT Start Time 1648    OT Stop Time 1730    OT Time Calculation (min) 42 min    Activity Tolerance Patient tolerated treatment well    Behavior During Therapy [facility] for tasks assessed/performed           Past Medical History:  Diagnosis Date  . Allergies   . Allergy   . Migraine     Past Surgical History:  Procedure Laterality Date  . labrial repair    . TONSILLECTOMY AND ADENOIDECTOMY    . TYMPANOSTOMY TUBE PLACEMENT      There were no vitals filed for this visit.   Subjective Assessment - 12/13/19 1656    Subjective  S: Its still just sore.    Pertinent History Pt is a 24 y/o female s/p right anterior labral repair on 10/21/19. Pt is experiencing some stiffness and pain in RUE, has been referred to occupational therapy for evaluation and treatment by Dr. Doneen Poisson.    Currently in Pain? Yes    Pain Score 2     Pain Location Elbow    Pain Orientation Right    Pain Descriptors / Indicators Aching;Dull    Pain Type Acute pain              [facility] OT Assessment - 12/13/19 1718      Assessment   Medical Diagnosis s/p right anterior labral repair    Referring Provider (OT) Dr. Doneen Poisson    Onset Date/Surgical Date 10/21/19    Hand Dominance Right      Precautions   Precautions Other (comment)    Precaution Comments no lifting over 30#, avoid er combined with abduction      Observation/Other Assessments   Focus on Therapeutic Outcomes (FOTO)  60.56                      OT Treatments/Exercises (OP) - 12/13/19 1713      Exercises   Exercises Shoulder      Shoulder Exercises: Supine   Protraction AAROM;10 reps    Horizontal ABduction PROM;5 reps;AAROM;10 reps    External Rotation PROM;5 reps;AAROM;10 reps    Internal Rotation PROM;5 reps;AAROM;10 reps    Flexion PROM;5 reps;AAROM;10 reps    ABduction PROM;5 reps;AAROM;10 reps      Shoulder Exercises: Standing   Retraction AROM   2x; hold 10sec     Shoulder Exercises: ROM/Strengthening   UBE (Upper Arm Bike) Level 4. Pace 5. 1' forwards. 1' backwards    Proximal Shoulder Strengthening, Supine 1' each; rest breaks between    Ball on Wall 1'      Shoulder Exercises: Stretch   Cross Chest Stretch 2 reps;10 seconds    External Rotation Stretch 2 reps;10 seconds    Wall Stretch - Flexion 2 reps;10 seconds    Wall Stretch - ABduction 2 reps;10 seconds      Manual Therapy   Manual Therapy Myofascial release  Manual therapy comments Manual therapy completed separately from therapeutic exercises     Myofascial Release myofascial release and manual techniques completed to R upper arm, trapezius, and scapular regions to decrease pain and fascial restrictions and increase joint ROM                     OT Short Term Goals - 12/13/19 1839      OT SHORT TERM GOAL #1   Title Pt will be provided with and educated on HEP to improve mobility in RUE required for ADL completion.    Time 4    Period Weeks    Status On-going    Target Date 12/31/19      OT SHORT TERM GOAL #2   Title Pt will increase RUE A/ROM to WNL to improve mobility required to reach overhead and behind back during dressing and bathing tasks.    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    Time 4    Period Weeks    Status On-going      OT SHORT  TERM GOAL #4   Title Pt will decrease RUE pain to 3/10 or less to improve ability to sleep in preferred position.    Time 4    Period Weeks    Status On-going      OT SHORT TERM GOAL #5   Title Pt will decrease RUE fascial restrictions to minimal amounts or less to improve mobility required for functional reaching tasks.    Time 4    Period Weeks    Status On-going                    Plan - 12/13/19 1755    Clinical Impression Statement A: Starting session with myofascial release and PROM. Pt performing AAROM with dowel rod and arms on fire in supine. Pt demonsatrating stretches from HEP to dmeonsatrate understanding. Performing strengthening with ball on wall and UB bike. Cues provided htorughout for form and technique.    Body Structure / Function / Physical Skills ADL;Endurance;UE functional use;Fascial restriction;Pain;ROM;IADL;Strength    Plan P: Continue myofascial release, PROM, AAROM, proximal strengthening, and AROM.    OT Home Exercise Plan eval: shoulder stretches           Patient will benefit from skilled therapeutic intervention in order to improve the following deficits and impairments:   Body Structure / Function / Physical Skills: ADL, Endurance, UE functional use, Fascial restriction, Pain, ROM, IADL, Strength       Visit Diagnosis: Acute pain of right shoulder  Other symptoms and signs involving the musculoskeletal system    Problem List Patient Active Problem List   Diagnosis Date Noted  . Status post labral repair of shoulder 10/28/2019  . Glenoid labral tear, right, subsequent encounter 09/29/2019  . Acute pain of right shoulder 12/22/2018  . 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 Cervical radiculitis 12/29/2015  . Lumbar radiculopathy 12/29/2015  . Seasonal allergies 07/04/2011  . Irregular menses 07/04/2011  . Headache(784.0) 07/04/2011    Gabriel Rung, MSOT, OTR/L 12/13/2019, 6:39 PM  [facility] [address] [place], [place], [address] Phone: [phone]   Fax:  [phone]  Name:  Ashlee Newton MRN: [id number] Date of Birth: Sep 04, 1995

## 2019-12-15 ENCOUNTER — Encounter (HOSPITAL_COMMUNITY): Payer: BC Managed Care – PPO | Admitting: Occupational Therapy

## 2019-12-20 ENCOUNTER — Other Ambulatory Visit: Payer: Self-pay

## 2019-12-20 ENCOUNTER — Ambulatory Visit (HOSPITAL_COMMUNITY): Payer: BC Managed Care – PPO | Admitting: Occupational Therapy

## 2019-12-20 ENCOUNTER — Encounter (HOSPITAL_COMMUNITY): Payer: Self-pay | Admitting: Occupational Therapy

## 2019-12-20 DIAGNOSIS — R29898 Other symptoms and signs involving the musculoskeletal system: Secondary | ICD-10-CM

## 2019-12-20 DIAGNOSIS — M25511 Pain in right shoulder: Secondary | ICD-10-CM

## 2019-12-20 NOTE — Therapy (Signed)
[facility] [address] [place], [place], [address] Phone: [phone]   Fax:  [phone]  Occupational Therapy Treatment  Patient Details  Name: Ashlee Newton MRN: [id number] Date of Birth: 1996-03-06 Referring Provider (OT): Dr. Doneen Poisson   Encounter Date: 12/20/2019   OT End of Session - 12/20/19 1704    Visit Number 3    Number of Visits 8    Date for OT Re-Evaluation 12/31/19    Authorization Type [employer], no visit limit    OT Start Time 1646    OT Stop Time 1728    OT Time Calculation (min) 42 min    Activity Tolerance Patient tolerated treatment well    Behavior During Therapy [facility] for tasks assessed/performed           Past Medical History:  Diagnosis Date  . Allergies   . Allergy   . Migraine     Past Surgical History:  Procedure Laterality Date  . labrial repair    . TONSILLECTOMY AND ADENOIDECTOMY    . TYMPANOSTOMY TUBE PLACEMENT      There were no vitals filed for this visit.   Subjective Assessment - 12/20/19 1647    Subjective  S: I just worked all weekend.    Pertinent History Pt is a 24 y/o female s/p right anterior labral repair on 10/21/19. Pt is experiencing some stiffness and pain in RUE, has been referred to occupational therapy for evaluation and treatment by Dr. Doneen Poisson.    Currently in Pain? Yes    Pain Score 4     Pain Location Shoulder    Pain Orientation Right              [facility] OT Assessment - 12/20/19 1702      Assessment   Medical Diagnosis s/p right anterior labral repair    Referring Provider (OT) Dr. Doneen Poisson      Precautions   Precautions Other (comment)    Precaution Comments no lifting over 30#, avoid er combined with abduction                    OT Treatments/Exercises (OP) - 12/20/19 1702      Exercises   Exercises Shoulder      Shoulder Exercises: Supine   Horizontal ABduction PROM;10 reps    External Rotation PROM;10  reps    Internal Rotation PROM;10 reps    Flexion PROM;10 reps    ABduction PROM;10 reps      Shoulder Exercises: Standing   Horizontal ABduction AAROM;15 reps   with dowel rod   External Rotation AAROM;15 reps;Strengthening;10 reps   with dowel rod   Theraband Level (Shoulder External Rotation) Level 2 (Red)    Internal Rotation AAROM;15 reps;Strengthening;10 reps   with dowel rod   Theraband Level (Shoulder Internal Rotation) Level 2 (Red)    Flexion AAROM;15 reps   with dowel rod   ABduction AAROM;15 reps   with dowel rod   Extension Strengthening;10 reps;Theraband    Row AAROM;15 reps;Strengthening;10 reps;Theraband   with dowel rod   Theraband Level (Shoulder Row) Level 2 (Red)      Shoulder Exercises: ROM/Strengthening   UBE (Upper Arm Bike) Level 1. 2' forward. 2' backwards. Pace 5    Thumb Tacks 1' with 2# wrist weights    "W" Arms 15x with 2# wrist weight    Proximal Shoulder Strengthening, Supine 1' each; rest breaks between    [place] on Wall  1' forward flexion, 1' abduction      Manual Therapy   Manual Therapy Myofascial release    Manual therapy comments Manual therapy completed separately from therapeutic exercises     Myofascial Release myofascial release and manual techniques completed to R upper arm, trapezius, and scapular regions to decrease pain and fascial restrictions and increase joint ROM                     OT Short Term Goals - 12/13/19 1839      OT SHORT TERM GOAL #1   Title Pt will be provided with and educated on HEP to improve mobility in RUE required for ADL completion.    Time 4    Period Weeks    Status On-going    Target Date 12/31/19      OT SHORT TERM GOAL #2   Title Pt will increase RUE A/ROM to WNL to improve mobility required to reach overhead and behind back during dressing and bathing tasks.    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    Time 4    Period Weeks    Status On-going      OT SHORT TERM GOAL #4   Title Pt will decrease RUE pain to 3/10 or less to improve ability to sleep in preferred position.    Time 4    Period Weeks    Status On-going      OT SHORT TERM GOAL #5   Title Pt will decrease RUE fascial restrictions to minimal amounts or less to improve mobility required for functional reaching tasks.    Time 4    Period Weeks    Status On-going                    Plan - 12/20/19 1705    Clinical Impression Statement A: Increased soreness today. Discussed not stretching too hard during wall and doorframe stretches at home. Continued with manual therapy, AAROM, and proximal stregthening. Pt performing thumb tacks, ball on wall, therabands, and UB bike. Cues throughout for form and technique.    Body Structure / Function / Physical Skills ADL;Endurance;UE functional use;Fascial restriction;Pain;ROM;IADL;Strength    Plan P: Continue myofascial release, PROM, AAROM, proximal strengthening, and AROM.    OT Home Exercise Plan eval: shoulder stretches           Patient will benefit from skilled therapeutic intervention in order to improve the following deficits and impairments:   Body Structure / Function / Physical Skills: ADL, Endurance, UE functional use, Fascial restriction, Pain, ROM, IADL, Strength       Visit Diagnosis: Acute pain of right shoulder  Other symptoms and signs involving the musculoskeletal system    Problem List Patient Active Problem List   Diagnosis Date Noted  . Status post labral repair of shoulder 10/28/2019  . Glenoid labral tear, right, subsequent encounter 09/29/2019  . Acute pain of right shoulder 12/22/2018  . 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 Cervical radiculitis 12/29/2015  . Lumbar radiculopathy 12/29/2015  . Seasonal allergies 07/04/2011  . Irregular menses  07/04/2011  . Headache(784.0) 07/04/2011    Gabriel Rung, MSOT, OTR/L 12/20/2019, 5:31 PM  [facility] [address] [place], [place], [address] Phone: [phone]   Fax:  [phone]  Name: Ashlee Newton MRN: [id number] Date of Birth: [birth year]

## 2019-12-22 ENCOUNTER — Encounter (HOSPITAL_COMMUNITY): Payer: BC Managed Care – PPO | Admitting: Occupational Therapy

## 2019-12-27 ENCOUNTER — Telehealth (HOSPITAL_COMMUNITY): Payer: Self-pay | Admitting: Occupational Therapy

## 2019-12-27 ENCOUNTER — Ambulatory Visit (HOSPITAL_COMMUNITY): Payer: BC Managed Care – PPO | Admitting: Occupational Therapy

## 2019-12-27 NOTE — Telephone Encounter (Signed)
pt cancelled appt for today because her dad is being discharged from the hospital

## 2019-12-29 ENCOUNTER — Ambulatory Visit: Payer: BC Managed Care – PPO | Admitting: Orthopaedic Surgery

## 2019-12-29 ENCOUNTER — Encounter (HOSPITAL_COMMUNITY): Payer: BC Managed Care – PPO

## 2019-12-31 ENCOUNTER — Encounter (HOSPITAL_COMMUNITY): Payer: BC Managed Care – PPO | Admitting: Occupational Therapy

## 2019-12-31 ENCOUNTER — Other Ambulatory Visit: Payer: Self-pay

## 2019-12-31 ENCOUNTER — Emergency Department
Admission: EM | Admit: 2019-12-31 | Discharge: 2019-12-31 | Disposition: A | Discharge status: Home / Self Care | Payer: BC Managed Care – PPO | Source: Home / Self Care | Attending: Family Medicine | Admitting: Family Medicine

## 2019-12-31 DIAGNOSIS — J069 Acute upper respiratory infection, unspecified: Secondary | ICD-10-CM | POA: Diagnosis not present

## 2019-12-31 DIAGNOSIS — Z20822 Contact with and (suspected) exposure to covid-19: Secondary | ICD-10-CM | POA: Diagnosis not present

## 2019-12-31 LAB — POC SARS CORONAVIRUS 2 AG -  ED: SARS Coronavirus 2 Ag: NEGATIVE

## 2019-12-31 LAB — POCT RAPID STREP A (OFFICE): Rapid Strep A Screen: NEGATIVE

## 2019-12-31 MED ORDER — PREDNISONE 20 MG PO TABS
ORAL_TABLET | ORAL | 0 refills | Status: DC
Start: 2019-12-31 — End: 2020-01-07

## 2019-12-31 NOTE — Discharge Instructions (Addendum)
Take plain guaifenesin (1200mg  extended release tabs such as Mucinex) twice daily, with plenty of water, for cough and congestion.  May add Pseudoephedrine (30mg , one or two every 4 to 6 hours) for sinus congestion.  Get adequate rest.   May use Afrin nasal spray (or generic oxymetazoline) each morning for about 5 days and then discontinue.  Also recommend using saline nasal spray several times daily and saline nasal irrigation (AYR is a common brand).  Use Flonase nasal spray each morning after using Afrin nasal spray and saline nasal irrigation. Try warm salt water gargles for sore throat.  Stop all antihistamines for now (Xyzal, etc), and other non-prescription cough/cold preparations. Continue Singulair. May take Delsym Cough Suppressant ("12 Hour Cough Relief") at bedtime for nighttime cough.    Isolate yourself until COVID-19 test result is available.   If your COVID19 test is positive, then you are infected with the novel coronavirus and could give the virus to others.  Please continue isolation at home for at least 10 days since the start of your symptoms. Once you complete your 10 day quarantine, you may return to normal activities as long as you've not had a fever for over 24 hours (without taking fever reducing medicine) and your symptoms are improving. Please continue good preventive care measures, including:  frequent hand-washing, avoid touching your face, cover coughs/sneezes, stay out of crowds and keep a 6 foot distance from others.  Go to the nearest hospital emergency room if fever/cough/breathlessness are severe or illness seems like a threat to life.

## 2019-12-31 NOTE — ED Triage Notes (Signed)
Pt states that she has a severe sore throat, headache and some drainage. Pt states that the sore throat developed on Tuesday and all of the other symptoms x1 day. Pt states that she has not been  Vaccinated and a coworker just tested positive.

## 2019-12-31 NOTE — ED Provider Notes (Signed)
[facility]    CSN: [id number] Arrival date & time: 12/31/19  0805      History   Chief Complaint Chief Complaint  Patient presents with  . Sore Throat    HPI Ashlee Newton is a 24 y.o. female.   Patient states that she developed a severe sore throat three days ago with sinus congestion and headache.  She has also developed fatigue, cough, and fever.  She reports that a co-worker has tested positive for COVID. She has a past history of otitis media with T-tubes, and pneumonia.  She takes Zyzal and Singulair for allergic rhinitis.  She denies chest tightness, shortness of breath, and changes in taste/smell.    The history is provided by the patient.    Past Medical History:  Diagnosis Date  . Allergies   . Allergy   . Migraine     Patient Active Problem List   Diagnosis Date Noted  . Status post labral repair of shoulder 10/28/2019  . Glenoid labral tear, right, subsequent encounter 09/29/2019  . Acute pain of right shoulder 12/22/2018  . 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 Cervical radiculitis 12/29/2015  . Lumbar radiculopathy 12/29/2015  . Seasonal allergies 07/04/2011  . Irregular menses 07/04/2011  . Headache(784.0) 07/04/2011    Past Surgical History:  Procedure Laterality Date  . labrial repair    . TONSILLECTOMY AND ADENOIDECTOMY    . TYMPANOSTOMY TUBE PLACEMENT      OB History    Gravida  0   Para  0   Term  0   Preterm  0   AB  0   Living  0     SAB  0   TAB  0   Ectopic  0   Multiple  0   Live Births  0            Home Medications    Prior to Admission medications   Medication Sig Start Date End Date Taking? Authorizing Provider  levocetirizine (XYZAL) 5 MG tablet Take by mouth.   Yes [provider]  levonorgestrel (MIRENA) 20 MCG/24HR IUD 1 each by Intrauterine route once. Mirena IUD placed 06/25/2019   Yes [provider]  montelukast (SINGULAIR) 10 MG tablet Take  by mouth.   Yes [provider]  triamcinolone cream (KENALOG) 0.1 % Apply 1 application topically 2 (two) times daily. 05/23/16  Yes Lattie Haw, MD  drospirenone-ethinyl estradiol (YAZ) 3-0.02 MG tablet Take 1 tablet by mouth daily. 11/18/19   Anyanwu, Jethro Bastos, MD  Ibuprofen-Famotidine 800-26.6 MG TABS Take 1 tablet by mouth 3 (three) times daily. 12/22/18   Myra Rude, MD  methocarbamol (ROBAXIN) 500 MG tablet Take 1 tablet (500 mg total) by mouth every 6 (six) hours as needed for muscle spasms. 11/02/19   Kathryne Hitch, MD  predniSONE (DELTASONE) 20 MG tablet Take one tab by mouth twice daily for 4 days, then one daily for 3 days. Take with food. 12/31/19   Lattie Haw, MD    Family History Family History  Problem Relation Age of Onset  . Migraines Mother   . Hypercholesterolemia Mother   . Hypertension Father   . Cancer Maternal Grandfather        pancreatitis --> pancreatic CA    Social History Social History   Tobacco Use  . Smoking status: Never Smoker  . Smokeless tobacco: Never Used  Vaping Use  . Vaping Use: Never used  Substance Use Topics  . Alcohol use: Not Currently  . Drug use: No     Allergies   Contrast media [iodinated diagnostic agents]   Review of Systems Review of Systems + sore throat + cough No pleuritic pain No wheezing + nasal congestion + post-nasal drainage + sinus pain/pressure No itchy/red eyes No earache No hemoptysis No SOB + fever, + chills + nausea No vomiting No abdominal pain No diarrhea No urinary symptoms No skin rash + fatigue + myalgias No headache   Physical Exam Triage Vital Signs ED Triage Vitals  Enc Vitals Group     BP 12/31/19 0825 121/84     Pulse Rate 12/31/19 0825 (!) 108     Resp --      Temp 12/31/19 0825 98.2 F (36.8 C)     Temp Source 12/31/19 0825 Oral     SpO2 12/31/19 0825 95 %     Weight 12/31/19 0821 230 lb (104.3 kg)     Height 12/31/19 0821 5\' 4"  (1.626  m)     Head Circumference --      Peak Flow --      Pain Score 12/31/19 0821 7     Pain Loc --      Pain Edu? --      Excl. in GC? --    No data found.  Updated Vital Signs BP 121/84 (BP Location: Right Arm)   Pulse (!) 108   Temp 98.2 F (36.8 C) (Oral)   Ht 5\' 4"  (1.626 m)   Wt 104.3 kg   SpO2 95%   BMI 39.48 kg/m   Visual Acuity Right Eye Distance:   Left Eye Distance:   Bilateral Distance:    Right Eye Near:   Left Eye Near:    Bilateral Near:     Physical Exam Nursing notes and Vital Signs reviewed. Appearance:  Patient appears stated age, and in no acute distress Eyes:  Pupils are equal, round, and reactive to light and accomodation.  Extraocular movement is intact.  Conjunctivae are not inflamed  Ears:  Canals normal.  Tympanic membranes normal.  Nose:  Mildly congested turbinates.  No sinus tenderness.  Pharynx:   Mildly erythematous Neck:  Supple.  Tender tonsillar nodes.  Tender left lateral nodes.  Lungs:  Clear to auscultation.  Breath sounds are equal.  Moving air well. Heart:  Regular rate and rhythm without murmurs, rubs, or gallops.  Abdomen:  Nontender without masses or hepatosplenomegaly.  Bowel sounds are present.  No CVA or flank tenderness.  Extremities:  No edema.  Skin:  No rash present.   UC Treatments / Results  Labs (all labs ordered are listed, but only abnormal results are displayed) Labs Reviewed  STREP A DNA PROBE  SARS-COV-2 RNA,(COVID-19) QUALITATIVE NAAT  POC SARS CORONAVIRUS 2 AG -  ED negative  POCT RAPID STREP A (OFFICE) negative    EKG   Radiology No results found.  Procedures Procedures (including critical care time)  Medications Ordered in UC Medications - No data to display  Initial Impression / Assessment and Plan / UC Course  I have reviewed the triage vital signs and the nursing notes.  Pertinent labs & imaging results that were available during my care of the patient were reviewed by me and considered in  my medical decision making (see chart for details).    Strep A DNA probe and COVID19 test pending.  There is no evidence of bacterial infection today.   Suspect mild  reactive airways disease.  Begin prednisone burst/taper. Followup with Family Doctor if not improved in about 10 days.   Final Clinical Impressions(s) / UC Diagnoses   Final diagnoses:  Viral URI with cough  Exposure to COVID-19 virus     Discharge Instructions     Take plain guaifenesin (1200mg  extended release tabs such as Mucinex) twice daily, with plenty of water, for cough and congestion.  May add Pseudoephedrine (30mg , one or two every 4 to 6 hours) for sinus congestion.  Get adequate rest.   May use Afrin nasal spray (or generic oxymetazoline) each morning for about 5 days and then discontinue.  Also recommend using saline nasal spray several times daily and saline nasal irrigation (AYR is a common brand).  Use Flonase nasal spray each morning after using Afrin nasal spray and saline nasal irrigation. Try warm salt water gargles for sore throat.  Stop all antihistamines for now (Xyzal, etc), and other non-prescription cough/cold preparations. Continue Singulair. May take Delsym Cough Suppressant ("12 Hour Cough Relief") at bedtime for nighttime cough.    Isolate yourself until COVID-19 test result is available.   If your COVID19 test is positive, then you are infected with the novel coronavirus and could give the virus to others.  Please continue isolation at home for at least 10 days since the start of your symptoms. Once you complete your 10 day quarantine, you may return to normal activities as long as you've not had a fever for over 24 hours (without taking fever reducing medicine) and your symptoms are improving. Please continue good preventive care measures, including:  frequent hand-washing, avoid touching your face, cover coughs/sneezes, stay out of crowds and keep a 6 foot distance from others.  Go to the  nearest hospital emergency room if fever/cough/breathlessness are severe or illness seems like a threat to life.         ED Prescriptions    Medication Sig Dispense Auth. Provider   predniSONE (DELTASONE) 20 MG tablet Take one tab by mouth twice daily for 4 days, then one daily for 3 days. Take with food. 11 tablet , MD        , MD 01/03/20 1201

## 2020-01-02 LAB — STREP A DNA PROBE: Group A Strep Probe: NOT DETECTED

## 2020-01-02 LAB — SARS-COV-2 RNA,(COVID-19) QUALITATIVE NAAT: SARS CoV2 RNA: NOT DETECTED

## 2020-01-03 ENCOUNTER — Telehealth (HOSPITAL_COMMUNITY): Payer: Self-pay | Admitting: Occupational Therapy

## 2020-01-03 NOTE — Telephone Encounter (Signed)
Pt l/m to cx her apptment for today - called her back to let her know her next apptment is on Friday not today 01/03/20 - l/m - waiting on return phone call from patient before I cx this friday8/13 apptment. NF 01/03/20

## 2020-01-06 ENCOUNTER — Telehealth (HOSPITAL_COMMUNITY): Payer: Self-pay | Admitting: Occupational Therapy

## 2020-01-06 NOTE — Telephone Encounter (Signed)
Pt has been exposed to Covid at work and will not be here today. She will be tested and call us back to r/s at a later date

## 2020-01-07 ENCOUNTER — Ambulatory Visit (HOSPITAL_COMMUNITY): Payer: BC Managed Care – PPO | Admitting: Occupational Therapy

## 2020-01-07 ENCOUNTER — Encounter: Payer: Self-pay | Admitting: Osteopathic Medicine

## 2020-01-07 ENCOUNTER — Telehealth (INDEPENDENT_AMBULATORY_CARE_PROVIDER_SITE_OTHER): Payer: BC Managed Care – PPO | Admitting: Osteopathic Medicine

## 2020-01-07 DIAGNOSIS — B9789 Other viral agents as the cause of diseases classified elsewhere: Secondary | ICD-10-CM | POA: Diagnosis not present

## 2020-01-07 DIAGNOSIS — J988 Other specified respiratory disorders: Secondary | ICD-10-CM | POA: Diagnosis not present

## 2020-01-07 MED ORDER — GUAIFENESIN-CODEINE 100-10 MG/5ML PO SYRP: 5.0000 mL | ORAL_SOLUTION | Freq: Four times a day (QID) | ORAL | 0 refills | Status: DC | PRN

## 2020-01-07 MED ORDER — IPRATROPIUM BROMIDE 0.06 % NA SOLN: 2.0000 | Freq: Four times a day (QID) | NASAL | 1 refills | Status: DC

## 2020-01-07 MED ORDER — AMOXICILLIN-POT CLAVULANATE 875-125 MG PO TABS
1.0000 | ORAL_TABLET | Freq: Two times a day (BID) | ORAL | 0 refills | Status: DC
Start: 2020-01-07 — End: 2020-02-01

## 2020-01-07 MED ORDER — DEXAMETHASONE 20 MG PO TABS
ORAL_TABLET | ORAL | 0 refills | Status: AC
Start: 2020-01-07 — End: 2020-01-18

## 2020-01-07 NOTE — Patient Instructions (Signed)
Meds ordered this encounter  Medications  . COUGH MEDICATIONS guaiFENesin-codeine (ROBITUSSIN AC) 100-10 MG/5ML syrup    Sig: Take 5-10 mLs by mouth 4 (four) times daily as needed for cough.    Dispense:  236 mL    Refill:  0  . STEROIDS dexamethasone 20 MG TABS    Sig: Take 20 mg by mouth daily for 5 days, THEN 10 mg daily for 6 days.    Dispense:  8 tablet    Refill:  0  . NASAL SPRAY FOR SINUS CONGESTION ipratropium (ATROVENT) 0.06 % nasal spray    Sig: Place 2 sprays into both nostrils 4 (four) times daily.    Dispense:  15 mL    Refill:  1  . ANTIBIOTICS FOR SINUSITIS IF NO BETTER BY mONDAY amoxicillin-clavulanate (AUGMENTIN) 875-125 MG tablet    Sig: Take 1 tablet by mouth 2 (two) times daily. TAKE RX IF SINUS PRESSURE WORSENS OR PERSISTS BY MON 01/10/20    Dispense:  14 tablet    Refill:  0      Medications & Home Remedies for Upper Respiratory Illness   Aches/Pains, Fever, Headache OTC Acetaminophen (Tylenol) 500 mg tablets - take max 2 tablets (1000 mg) every 6 hours (4 times per day)  OTC Ibuprofen (Motrin) 200 mg tablets - take max 4 tablets (800 mg) every 6 hours*   Sinus Congestion Prescription Atrovent as directed OTC Nasal Saline if desired to rinse OTC Phenylephrine (Sudafed) 10 mg tablets every 4 hours (or the 12-hour formulation) OTC Diphenhydramine (Benadryl) 25 mg tablets - take max 2 tablets every 4 hours   Cough & Sore Throat Prescription cough pills or syrups as directed OTC Dextromethorphan (Robitussin, others) - cough suppressant OTC Lozenges w/ Benzocaine + Menthol (Cepacol) Honey - as much as you want! Teas which "coat the throat" - look for ingredients Elm Bark, Licorice Root, Marshmallow Root   Other Prescription Oral Steroids to decrease inflammation and improve energy Prescription Antibiotics if these are necessary for bacterial infection - take ALL, even if you're feeling better  OTC Zinc Lozenges within 24 hours of symptoms onset -  mixed evidence this shortens the duration of the common cold Don't waste your money on Vitamin C or Echinacea in acute illness - it's already too late!

## 2020-01-07 NOTE — Progress Notes (Signed)
Virtual Visit via Phone  I connected with      Ashlee Newton on 01/07/20 at 9:54 AM  by a telemedicine application and verified that I am speaking with the correct person using two identifiers.  Patient is at home I am in office   I discussed the limitations of evaluation and management by telemedicine and the availability of in person appointments. The patient expressed understanding and agreed to proceed.  History of Present Illness: Ashlee Newton is a 24 y.o. female who would like to discuss respiratory illness, cough, sinus congestion    12/30/19 started having symptoms with cough, chest/sinus congestion 12/31/19 (-)COVID test  Cough, sinus congestion have persisted despite treatment with OTC     Observations/Objective: There were no vitals taken for this visit. BP Readings from Last 3 Encounters:  12/31/19 121/84  11/18/19 123/79  09/21/19 (!) 141/62   Exam: Normal Speech.  NAD  Lab and Radiology Results No results found for this or any previous visit (from the past 72 hour(s)). No results found.     Assessment and Plan: 24 y.o. female with The encounter diagnosis was Viral respiratory infection.  Given that patient has not been vaccinated for COVID-19 and respiratory symptoms, despite negative test would still quarantine/isolate appropriately and ER precautions were reviewed with regard to worsening shortness of breath, cough, fever, or other concerns.  PDMP not reviewed this encounter. No orders of the defined types were placed in this encounter.  Patient Instructions   Meds ordered this encounter  Medications  . COUGH MEDICATIONS guaiFENesin-codeine (ROBITUSSIN AC) 100-10 MG/5ML syrup    Sig: Take 5-10 mLs by mouth 4 (four) times daily as needed for cough.    Dispense:  236 mL    Refill:  0  . STEROIDS dexamethasone 20 MG TABS    Sig: Take 20 mg by mouth daily for 5 days, THEN 10 mg daily for 6 days.    Dispense:  8 tablet    Refill:  0  . NASAL  SPRAY FOR SINUS CONGESTION ipratropium (ATROVENT) 0.06 % nasal spray    Sig: Place 2 sprays into both nostrils 4 (four) times daily.    Dispense:  15 mL    Refill:  1  . ANTIBIOTICS FOR SINUSITIS IF NO BETTER BY mONDAY amoxicillin-clavulanate (AUGMENTIN) 875-125 MG tablet    Sig: Take 1 tablet by mouth 2 (two) times daily. TAKE RX IF SINUS PRESSURE WORSENS OR PERSISTS BY MON 01/10/20    Dispense:  14 tablet    Refill:  0      Medications & Home Remedies for Upper Respiratory Illness   Aches/Pains, Fever, Headache OTC Acetaminophen (Tylenol) 500 mg tablets - take max 2 tablets (1000 mg) every 6 hours (4 times per day)  OTC Ibuprofen (Motrin) 200 mg tablets - take max 4 tablets (800 mg) every 6 hours*   Sinus Congestion Prescription Atrovent as directed OTC Nasal Saline if desired to rinse OTC Phenylephrine (Sudafed) 10 mg tablets every 4 hours (or the 12-hour formulation) OTC Diphenhydramine (Benadryl) 25 mg tablets - take max 2 tablets every 4 hours   Cough & Sore Throat Prescription cough pills or syrups as directed OTC Dextromethorphan (Robitussin, others) - cough suppressant OTC Lozenges w/ Benzocaine + Menthol (Cepacol) Honey - as much as you want! Teas which "coat the throat" - look for ingredients Elm Bark, Licorice Root, Marshmallow Root   Other Prescription Oral Steroids to decrease inflammation and improve energy Prescription Antibiotics if these  are necessary for bacterial infection - take ALL, even if you're feeling better  OTC Zinc Lozenges within 24 hours of symptoms onset - mixed evidence this shortens the duration of the common cold Don't waste your money on Vitamin C or Echinacea in acute illness - it's already too late!       Instructions sent via MyChart. If MyChart not available, pt was given option for info via personal e-mail w/ no guarantee of protected health info over unsecured e-mail communication, and MyChart sign-up instructions were sent to  patient.   Follow Up Instructions: Return if symptoms worsen or fail to improve.    I discussed the assessment and treatment plan with the patient. The patient was provided an opportunity to ask questions and all were answered. The patient agreed with the plan and demonstrated an understanding of the instructions.   The patient was advised to call back or seek an in-person evaluation if any new concerns, if symptoms worsen or if the condition fails to improve as anticipated.  30 minutes of non-face-to-face time was provided during this encounter.      . . . . . . . . . . . . . Marland Kitchen                   Historical information moved to improve visibility of documentation.  Past Medical History:  Diagnosis Date  . Allergies   . Allergy   . Migraine    Past Surgical History:  Procedure Laterality Date  . labrial repair    . TONSILLECTOMY AND ADENOIDECTOMY    . TYMPANOSTOMY TUBE PLACEMENT     Social History   Tobacco Use  . Smoking status: Never Smoker  . Smokeless tobacco: Never Used  Substance Use Topics  . Alcohol use: Not Currently   family history includes Cancer in her maternal grandfather; Hypercholesterolemia in her mother; Hypertension in her father; Migraines in her mother.  Medications: Current Outpatient Medications  Medication Sig Dispense Refill  . levocetirizine (XYZAL) 5 MG tablet Take by mouth.    . levonorgestrel (MIRENA) 20 MCG/24HR IUD 1 each by Intrauterine route once. Mirena IUD placed 06/25/2019    . methocarbamol (ROBAXIN) 500 MG tablet Take 1 tablet (500 mg total) by mouth every 6 (six) hours as needed for muscle spasms. 60 tablet 1  . montelukast (SINGULAIR) 10 MG tablet Take by mouth.    . triamcinolone cream (KENALOG) 0.1 % Apply 1 application topically 2 (two) times daily. 30 g 0  . amoxicillin-clavulanate (AUGMENTIN) 875-125 MG tablet Take 1 tablet by mouth 2 (two) times daily. TAKE RX IF SINUS PRESSURE WORSENS OR  PERSISTS BY MON 01/10/20 14 tablet 0  . dexamethasone 20 MG TABS Take 20 mg by mouth daily for 5 days, THEN 10 mg daily for 6 days. 8 tablet 0  . guaiFENesin-codeine (ROBITUSSIN AC) 100-10 MG/5ML syrup Take 5-10 mLs by mouth 4 (four) times daily as needed for cough. 236 mL 0  . ipratropium (ATROVENT) 0.06 % nasal spray Place 2 sprays into both nostrils 4 (four) times daily. 15 mL 1   No current facility-administered medications for this visit.   Allergies  Allergen Reactions  . Contrast Media [Iodinated Diagnostic Agents] Rash

## 2020-01-10 ENCOUNTER — Ambulatory Visit: Payer: BC Managed Care – PPO | Admitting: Orthopaedic Surgery

## 2020-02-01 ENCOUNTER — Emergency Department
Admission: EM | Admit: 2020-02-01 | Discharge: 2020-02-01 | Disposition: A | Discharge status: Home / Self Care | Payer: BC Managed Care – PPO | Source: Home / Self Care | Attending: Family Medicine | Admitting: Family Medicine

## 2020-02-01 ENCOUNTER — Other Ambulatory Visit: Payer: Self-pay

## 2020-02-01 ENCOUNTER — Encounter: Payer: Self-pay | Admitting: Emergency Medicine

## 2020-02-01 DIAGNOSIS — M940 Chondrocostal junction syndrome [Tietze]: Secondary | ICD-10-CM

## 2020-02-01 DIAGNOSIS — R509 Fever, unspecified: Secondary | ICD-10-CM

## 2020-02-01 DIAGNOSIS — Z20822 Contact with and (suspected) exposure to covid-19: Secondary | ICD-10-CM

## 2020-02-01 DIAGNOSIS — J069 Acute upper respiratory infection, unspecified: Secondary | ICD-10-CM

## 2020-02-01 LAB — POC SARS CORONAVIRUS 2 AG -  ED: SARS Coronavirus 2 Ag: NEGATIVE

## 2020-02-01 NOTE — ED Provider Notes (Signed)
[facility]    CSN: [id number] Arrival date & time: 02/01/20  0808      History   Chief Complaint Chief Complaint  Patient presents with  . Covid Exposure  . Fever  . Cough    HPI KRIPA FOSKEY is a 24 y.o. female.   Patient developed a sore throat four days ago, followed by sinus congestion, fever/chills, fatigue, headache, and non-productive cough.  She has developed pain in her anterior chest but denies shortness of breath.  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    Past Medical History:  Diagnosis Date  . Allergies   . Allergy   . Migraine     Patient Active Problem List   Diagnosis Date Noted  . Status post labral repair of shoulder 10/28/2019  . Glenoid labral tear, right, subsequent encounter 09/29/2019  . Acute pain of right shoulder 12/22/2018  . 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 Cervical radiculitis 12/29/2015  . Lumbar radiculopathy 12/29/2015  . Seasonal allergies 07/04/2011  . Irregular menses 07/04/2011  . Headache(784.0) 07/04/2011    Past Surgical History:  Procedure Laterality Date  . labrial repair    . TONSILLECTOMY AND ADENOIDECTOMY    . TYMPANOSTOMY TUBE PLACEMENT      OB History    Gravida  0   Para  0   Term  0   Preterm  0   AB  0   Living  0     SAB  0   TAB  0   Ectopic  0   Multiple  0   Live Births  0            Home Medications    Prior to Admission medications   Medication Sig Start Date End Date Taking? Authorizing Provider  levocetirizine (XYZAL) 5 MG tablet Take by mouth.   Yes [provider]  montelukast (SINGULAIR) 10 MG tablet Take by mouth.   Yes [provider]  ipratropium (ATROVENT) 0.06 % nasal spray Place 2 sprays into both nostrils 4 (four) times daily. 01/07/20   Sunnie Nielsen, DO  levonorgestrel (MIRENA) 20 MCG/24HR IUD 1 each by  Intrauterine route once. Mirena IUD placed 06/25/2019    [provider]  methocarbamol (ROBAXIN) 500 MG tablet Take 1 tablet (500 mg total) by mouth every 6 (six) hours as needed for muscle spasms. 11/02/19   Kathryne Hitch, MD  triamcinolone cream (KENALOG) 0.1 % Apply 1 application topically 2 (two) times daily. 05/23/16   Lattie Haw, MD    Family History Family History  Problem Relation Age of Onset  . Migraines Mother   . Hypercholesterolemia Mother   . Hypertension Father   . Cancer Maternal Grandfather        pancreatitis --> pancreatic CA    Social History Social History   Tobacco Use  . Smoking status: Never Smoker  . Smokeless tobacco: Never Used  Vaping Use  . Vaping Use: Never used  Substance Use Topics  . Alcohol use: Not Currently  . Drug use: No     Allergies   Contrast media [iodinated diagnostic agents]   Review of Systems Review of Systems  + sore throat + cough No pleuritic pain, but feels tight in anterior chest No wheezing + nasal congestion + post-nasal drainage No sinus pain/pressure No itchy/red eyes No earache No  hemoptysis No SOB + fever, + chills No nausea No vomiting No abdominal pain No diarrhea No urinary symptoms No skin rash + fatigue + myalgias No headache     Physical Exam Triage Vital Signs ED Triage Vitals  Enc Vitals Group     BP 02/01/20 0827 127/84     Pulse Rate 02/01/20 0827 (!) 106     Resp 02/01/20 0827 17     Temp 02/01/20 0827 99.4 F (37.4 C)     Temp Source 02/01/20 0827 Oral     SpO2 02/01/20 0827 95 %     Weight 02/01/20 0832 230 lb (104.3 kg)     Height 02/01/20 0832 5\' 4"  (1.626 m)     Head Circumference --      Peak Flow --      Pain Score 02/01/20 0833 0     Pain Loc --      Pain Edu? --      Excl. in GC? --    No data found.  Updated Vital Signs BP 127/84 (BP Location: Right Arm)   Pulse (!) 106   Temp 99.4 F (37.4 C) (Oral)   Resp 17   Ht 5\' 4"  (1.626 m)    Wt 104.3 kg   SpO2 95%   BMI 39.48 kg/m   Visual Acuity Right Eye Distance:   Left Eye Distance:   Bilateral Distance:    Right Eye Near:   Left Eye Near:    Bilateral Near:     Physical Exam Nursing notes and Vital Signs reviewed. Appearance:  Patient appears stated age, and in no acute distress Eyes:  Pupils are equal, round, and reactive to light and accomodation.  Extraocular movement is intact.  Conjunctivae are not inflamed  Ears:  Canals normal.  Tympanic membranes normal.  Nose:  Mildly congested turbinates.  No sinus tenderness. Pharynx:  Normal Neck:  Supple.  Mildly enlarged lateral nodes are present, tender to palpation on the left.   Lungs:  Clear to auscultation.  Breath sounds are equal.  Moving air well. Chest:  Distinct tenderness to palpation over the mid-sternum.  Heart:  Regular rate and rhythm without murmurs, rubs, or gallops.  Abdomen:  Nontender without masses or hepatosplenomegaly.  Bowel sounds are present.  No CVA or flank tenderness.  Extremities:  No edema.  Skin:  No rash present.   UC Treatments / Results  Labs (all labs ordered are listed, but only abnormal results are displayed) Labs Reviewed  NOVEL CORONAVIRUS, NAA  POC SARS CORONAVIRUS 2 AG -  ED    EKG   Radiology No results found.  Procedures Procedures (including critical care time)  Medications Ordered in UC Medications - No data to display  Initial Impression / Assessment and Plan / UC Course  I have reviewed the triage vital signs and the nursing notes.  Pertinent labs & imaging results that were available during my care of the patient were reviewed by me and considered in my medical decision making (see chart for details).    There is no evidence of bacterial infection today.  Treat symptomatically for now. COVID19 PCR pending.   Final Clinical Impressions(s) / UC Diagnoses   Final diagnoses:  Fever with exposure to COVID-19 virus  Viral URI with cough    Costochondritis     Discharge Instructions     Take plain guaifenesin (1200mg  extended release tabs such as Mucinex) twice daily, with plenty of water, for cough and congestion.  May  add Pseudoephedrine (30mg , one or two every 4 to 6 hours) for sinus congestion.  Get adequate rest.   May use Afrin nasal spray (or generic oxymetazoline) each morning for about 5 days and then discontinue.  Also recommend using saline nasal spray several times daily and saline nasal irrigation (AYR is a common brand).  Use Flonase nasal spray each morning after using Afrin nasal spray and saline nasal irrigation. Try warm salt water gargles for sore throat.  Stop all antihistamines for now, and other non-prescription cough/cold preparations. May take Ibuprofen 200mg , 4 tabs every 8 hours with food for chest/sternum discomfort. May take Delsym Cough Suppressant ("12 Hour Cough Relief") at bedtime for nighttime cough.   Isolate yourself until COVID-19 test result is available.   If your COVID19 test is positive, then you are infected with the novel coronavirus and could give the virus to others.  Please continue isolation at home for at least 10 days since the start of your symptoms.  Once you complete your 10 day quarantine, you may return to normal activities as long as you've not had a fever for over 24 hours (without taking fever reducing medicine) and your symptoms are improving. Please continue good preventive care measures, including:  frequent hand-washing, avoid touching your face, cover coughs/sneezes, stay out of crowds and keep a 6 foot distance from others.  Go to the nearest hospital emergency room if fever/cough/breathlessness are severe or illness seems like a threat to life.   If your COVID19 test is positive, delay your COVID19 vaccination until you are without symptoms and released to return to work    ED Prescriptions    None        , MD 02/05/20 1201

## 2020-02-01 NOTE — Discharge Instructions (Addendum)
Take plain guaifenesin (1200mg  extended release tabs such as Mucinex) twice daily, with plenty of water, for cough and congestion.  May add Pseudoephedrine (30mg , one or two every 4 to 6 hours) for sinus congestion.  Get adequate rest.   May use Afrin nasal spray (or generic oxymetazoline) each morning for about 5 days and then discontinue.  Also recommend using saline nasal spray several times daily and saline nasal irrigation (AYR is a common brand).  Use Flonase nasal spray each morning after using Afrin nasal spray and saline nasal irrigation. Try warm salt water gargles for sore throat.  Stop all antihistamines for now, and other non-prescription cough/cold preparations. May take Ibuprofen 200mg , 4 tabs every 8 hours with food for chest/sternum discomfort. May take Delsym Cough Suppressant ("12 Hour Cough Relief") at bedtime for nighttime cough.   Isolate yourself until COVID-19 test result is available.   If your COVID19 test is positive, then you are infected with the novel coronavirus and could give the virus to others.  Please continue isolation at home for at least 10 days since the start of your symptoms.  Once you complete your 10 day quarantine, you may return to normal activities as long as you've not had a fever for over 24 hours (without taking fever reducing medicine) and your symptoms are improving. Please continue good preventive care measures, including:  frequent hand-washing, avoid touching your face, cover coughs/sneezes, stay out of crowds and keep a 6 foot distance from others.  Go to the nearest hospital emergency room if fever/cough/breathlessness are severe or illness seems like a threat to life.   If your COVID19 test is positive, delay your COVID19 vaccination until you are without symptoms and released to return to work

## 2020-02-01 NOTE — ED Triage Notes (Addendum)
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

## 2020-02-02 ENCOUNTER — Telehealth: Payer: Self-pay | Admitting: Osteopathic Medicine

## 2020-02-02 LAB — NOVEL CORONAVIRUS, NAA: SARS-CoV-2, NAA: NOT DETECTED

## 2020-02-02 NOTE — Telephone Encounter (Signed)
Left voicemail for patient to try to reschedule her appointment since it was a double book error with another employee.

## 2020-02-03 ENCOUNTER — Telehealth (INDEPENDENT_AMBULATORY_CARE_PROVIDER_SITE_OTHER): Payer: BC Managed Care – PPO | Admitting: Osteopathic Medicine

## 2020-02-03 ENCOUNTER — Encounter: Payer: Self-pay | Admitting: Osteopathic Medicine

## 2020-02-03 VITALS — Temp 99.0°F

## 2020-02-03 DIAGNOSIS — H5462 Unqualified visual loss, left eye, normal vision right eye: Secondary | ICD-10-CM | POA: Diagnosis not present

## 2020-02-03 DIAGNOSIS — J029 Acute pharyngitis, unspecified: Secondary | ICD-10-CM

## 2020-02-03 MED ORDER — AMOXICILLIN-POT CLAVULANATE 875-125 MG PO TABS: 1.0000 | ORAL_TABLET | Freq: Two times a day (BID) | ORAL | 0 refills | Status: AC

## 2020-02-03 MED ORDER — LIDOCAINE VISCOUS HCL 2 % MT SOLN: 5.0000 mL | OROMUCOSAL | 1 refills | Status: DC | PRN

## 2020-02-03 MED ORDER — PREDNISONE 50 MG PO TABS: ORAL_TABLET | ORAL | 0 refills | Status: DC

## 2020-02-03 MED ORDER — DIPHENHYDRAMINE HCL 50 MG PO TABS: 50.0000 mg | ORAL_TABLET | Freq: Once | ORAL | 0 refills | Status: DC

## 2020-02-03 NOTE — Progress Notes (Signed)
Virtual Visit via Video (App used: MyChart Video) Note  I connected with      Ashlee Newton on 02/03/20 at 9:53 AM  by a telemedicine application and verified that I am speaking with the correct person using two identifiers.  Patient is at home. I am in office   I discussed the limitations of evaluation and management by telemedicine and the availability of in person appointments. The patient expressed understanding and agreed to proceed.  History of Present Illness: Ashlee Newton is a 24 y.o. female who would like to discuss fever, sore throat, headaches.   Patient reports 6 days of fever (Tmax 102F), chills, worsening congestion, and body aches. Her symptoms worsened 2 days ago, but have improved slightly today. She states she was seen at [facility] and tested negative for COVID, but has frequent exposure . 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 She takes singulair and zyzol for this and previously got allergy injections, but with COVID, has not had her shot in over a month. She has also added sudafed and mucinex since her symptoms started.  She also reports a sore throat and states she has noted swelling in the back of her throat (particularly on the right side) with white spots noted on her throat. She states it looks like her tonsils are swollen, but she has had them removed twice, so she shouldn't have tonsils. She has had difficulty tolerating PO due to pain, stating she only ate a small amount of ice cream yesterday and has been trying to drink water, but has not been drinking enough. She endorses some lightheadedness as a result. She has also noted enlarged lymph nodes under her right jaw.  Patient reports she was seen about a month ago for URI symptoms, including fever, body aches, and congestion, similar to her current presentation. Since that time she has had daily headaches. She  had headaches previously, localized behind her left eye and had pain with ocular movements. She saw Dr. Georgina Snell for this in the past and had an MRI and consult with ophthalmology who attributed it to her optic nerves. She took imitrex for a time with no improvement, so she stopped it. She was able to manage the pain without medications, until they worsened recently. She has had daily headaches in the mornings, with left sided pain, left neck muscle spasms and darkened, decreased vision in her left eye. The headaches typically resolve in a few hours. She was alternating tylenol 1042m and ibuprofen 6021mfor her fever.     Observations/Objective: Temp 99 F (37.2 C)  BP Readings from Last 3 Encounters:  02/01/20 127/84  12/31/19 121/84  11/18/19 123/79   Exam: Normal Speech.  Pt appears uncomfortable and tired, but is able to speak clearly with no sign of pain.  Lab and Radiology Results Results for orders placed or performed during the hospital encounter of 02/01/20 (from the past 72 hour(s))  POC SARS Coronavirus 2 Ag-ED - Nasal Swab (BD Veritor Kit)     Status: None   Collection Time: 02/01/20  8:45 AM  Result Value Ref Range   SARS Coronavirus 2 Ag Negative Negative  Novel Coronavirus, NAA ([facility])     Status: None   Collection Time: 02/01/20  9:08 AM   Specimen: Nasal Swab; Nasopharyngeal(NP) swabs in vial transport medium   Nasopharynge  Patient  Result Value Ref Range  SARS-CoV-2, NAA Not Detected Not Detected    Comment: This nucleic acid amplification test was developed and its performance characteristics determined by [employer]. Nucleic acid amplification tests include RT-PCR and TMA. This test has not been FDA cleared or approved. This test has been authorized by FDA under an Emergency Use Authorization (EUA). This test is only authorized for the duration of time the declaration that circumstances exist justifying the authorization of the emergency use of in  vitro diagnostic tests for detection of SARS-CoV-2 virus and/or diagnosis of COVID-19 infection under section 564(b)(1) of the Act, 21 U.S.C. 419FXT-0(W) (1), unless the authorization is terminated or revoked sooner. When diagnostic testing is negative, the possibility of a false negative result should be considered in the context of a patient's recent exposures and the presence of clinical signs and symptoms consistent with COVID-19. An individual without symptoms of COVID-19 and who is not shedding SARS-CoV-2 virus wo uld expect to have a negative (not detected) result in this assay.    No results found.     Assessment and Plan: 24 y.o. female with There were no encounter diagnoses.  Upper Respiratory Infection  Symptoms could be related to viral VS bacterial illness. Rapid and NAAT COVID tests were negative, COVID unlikely. Flu or other viral illness is possible. However, concern for strep pharyngitis or retropharyngeal/peritonsillar abscess, given difficulty tolerating PO d/t pain and one sided lymphadenopathy with tonsillar exudate. Pt expressed some improvement today, no trismus or drooling, so suspicion for abscess is slightly decreased. Symptoms of sore throat and significant lymphadenopathy could also be related to mononucleosis, but this seems less likely.  Worsening headaches with vision loss are also concerning. Could be related to recent illness, but neurologic pathology is also possible. Orbit MRI done in 07/2018 and was unremarkable. Recommended patient have a brain MRI to rule out new/worsening pathology. Ordered placed. ER precautions reviewed.   Discussed options with patient, including coming into office for exam and strep test vs empiric antibiotics vs neck CT to evaluate for abscess. However, study would require contrast and patient has a contrast allergy. She opted to start antibiotics and monitor for symptom improvement. Also sent a prescription for prednisone and  benadryl, per contrast allergy protocol (prednisone 13 hrs, 6 hrs, and 1hr prior to study, benadryl 1 hr prior), so if patient worsens or does not improve, she can have the CT done tomorrow.  Advised her of ED precautions if symptoms worsen or do not improve, if she develops difficulty breathing, inability to swallow, or if vision loss recurs and does not resolve, or other concerns.     Follow Up Instructions: Return if symptoms worsen or fail to improve.    I discussed the assessment and treatment plan with the patient. The patient was provided an opportunity to ask questions and all were answered. The patient agreed with the plan and demonstrated an understanding of the instructions.   The patient was advised to call back or seek an in-person evaluation if any new concerns, if symptoms worsen or if the condition fails to improve as anticipated.  30 minutes of non-face-to-face time was provided during this encounter.      Historical information moved to improve visibility of documentation.  Past Medical History:  Diagnosis Date  . Allergies   . Allergy   . Migraine    Past Surgical History:  Procedure Laterality Date  . labrial repair    . TONSILLECTOMY AND ADENOIDECTOMY    . TYMPANOSTOMY TUBE PLACEMENT  Social History   Tobacco Use  . Smoking status: Never Smoker  . Smokeless tobacco: Never Used  Substance Use Topics  . Alcohol use: Not Currently   family history includes Cancer in her maternal grandfather; Hypercholesterolemia in her mother; Hypertension in her father; Migraines in her mother.  Medications: Current Outpatient Medications  Medication Sig Dispense Refill  . ipratropium (ATROVENT) 0.06 % nasal spray Place 2 sprays into both nostrils 4 (four) times daily. 15 mL 1  . levocetirizine (XYZAL) 5 MG tablet Take by mouth.    . levonorgestrel (MIRENA) 20 MCG/24HR IUD 1 each by Intrauterine route once. Mirena IUD placed 06/25/2019    . methocarbamol (ROBAXIN)  500 MG tablet Take 1 tablet (500 mg total) by mouth every 6 (six) hours as needed for muscle spasms. 60 tablet 1  . montelukast (SINGULAIR) 10 MG tablet Take by mouth.    . triamcinolone cream (KENALOG) 0.1 % Apply 1 application topically 2 (two) times daily. 30 g 0   No current facility-administered medications for this visit.   Allergies  Allergen Reactions  . Contrast Media [Iodinated Diagnostic Agents] Rash                                                                     Gweneth Dimitri, [facility] MS3

## 2020-02-04 ENCOUNTER — Other Ambulatory Visit: Payer: BC Managed Care – PPO

## 2020-02-07 ENCOUNTER — Other Ambulatory Visit: Payer: Self-pay

## 2020-02-07 ENCOUNTER — Ambulatory Visit (INDEPENDENT_AMBULATORY_CARE_PROVIDER_SITE_OTHER): Payer: BC Managed Care – PPO

## 2020-02-07 ENCOUNTER — Other Ambulatory Visit: Payer: BC Managed Care – PPO

## 2020-02-07 DIAGNOSIS — H5462 Unqualified visual loss, left eye, normal vision right eye: Secondary | ICD-10-CM

## 2020-02-07 DIAGNOSIS — J3489 Other specified disorders of nose and nasal sinuses: Secondary | ICD-10-CM | POA: Diagnosis not present

## 2020-02-07 DIAGNOSIS — R634 Abnormal weight loss: Secondary | ICD-10-CM | POA: Diagnosis not present

## 2020-02-07 IMAGING — MR MR HEAD WO/W CM
13 series · 48 of 48 positions shown · IV contrast (gadavist)
Comparison: None.

CLINICAL DATA: Monocular vision loss on the left. Involuntary
contraction of the left shoulder

EXAM:
MRI HEAD WITHOUT AND WITH CONTRAST
TECHNIQUE: Multiplanar, multiecho pulse sequences of the brain and surrounding
structures were obtained without and with intravenous contrast.
CONTRAST:  10mL GADAVIST GADOBUTROL 1 MMOL/ML IV SOLN

[Series 2: DWI · axial · 3.0mm · 1.20mm/px · z∈[-35,+111]mm · 6 of 100 slices shown (1 of 4)]
[im 1/100]
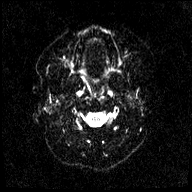
[im 20/100]
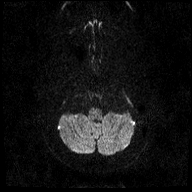
[im 40/100]
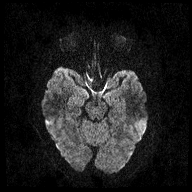
[im 60/100]
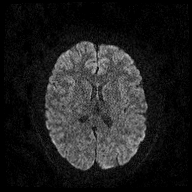
[im 80/100]
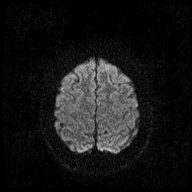
[im 100/100]
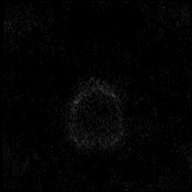

[Series 3: DWI · axial · 3.0mm · 1.20mm/px · z∈[-35,+111]mm · 3 of 50 slices shown (2 of 4)]
[im 1/50]
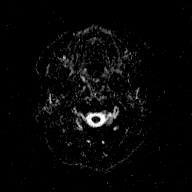
[im 25/50]
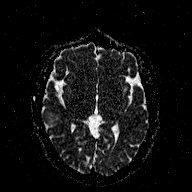
[im 50/50]
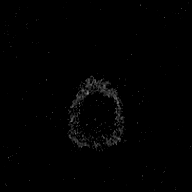

[Series 4: DWI · coronal · 3.0mm · 1.15mm/px · 5 of 90 slices shown (3 of 4)]
[im 1/90]
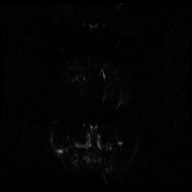
[im 23/90]
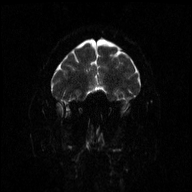
[im 45/90]
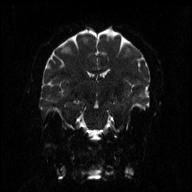
[im 67/90]
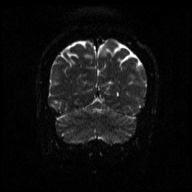
[im 90/90]
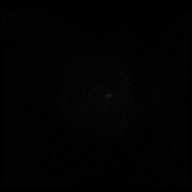

[Series 5: DWI · coronal · 3.0mm · 1.15mm/px · 3 of 45 slices shown (4 of 4)]
[im 1/45]
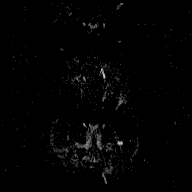
[im 23/45]
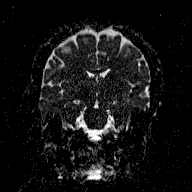
[im 45/45]
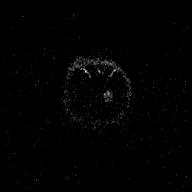

[Series 6: T1 · sagittal · 5.0mm · 0.45mm/px · 1 of 23 slices shown (1 of 2)]
[im 1/23]
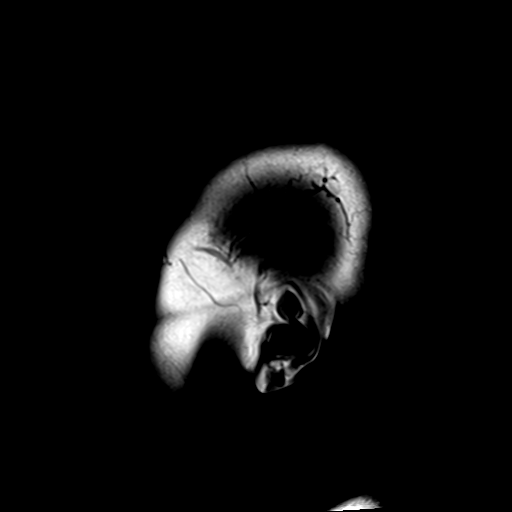

[Series 7: T2 · axial · 5.0mm · 0.72mm/px · 1 of 22 slices shown (1 of 2)]
[im 1/22]
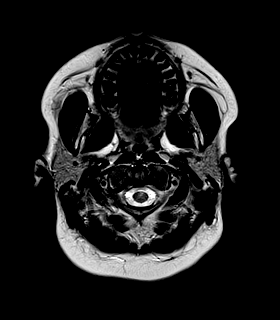

[Series 8: FLAIR · axial · 3.0mm · 0.45mm/px · z∈[-42,+119]mm · 3 of 55 slices shown]
[im 1/55]
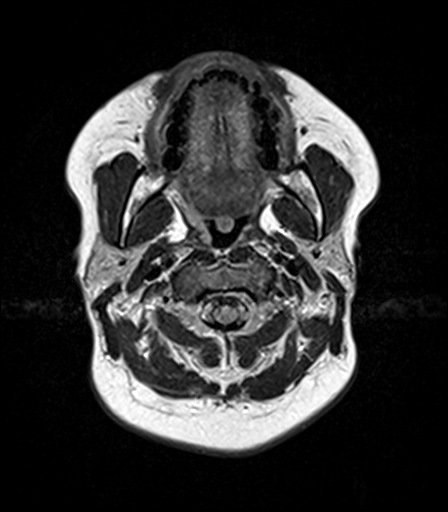
[im 28/55]
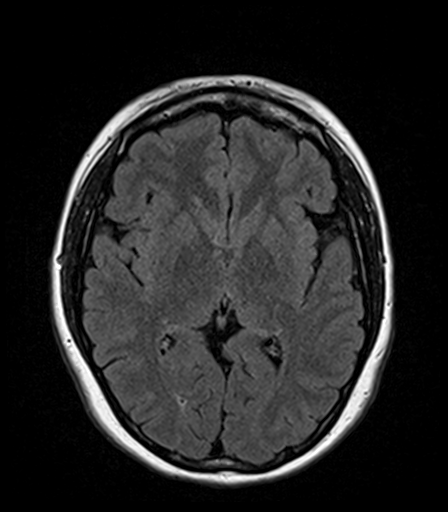
[im 55/55]
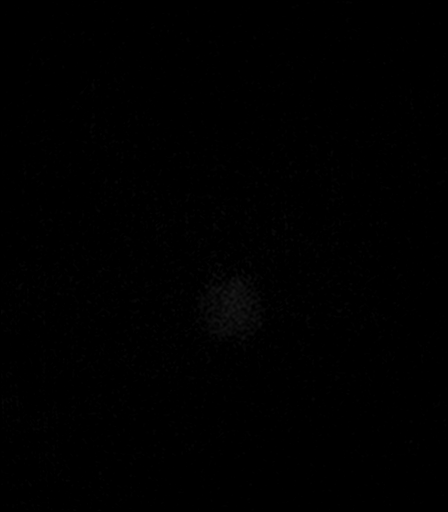

[Series 9: T2 · axial · 5.0mm · 0.72mm/px · 1 of 22 slices shown (2 of 2)]
[im 1/22]
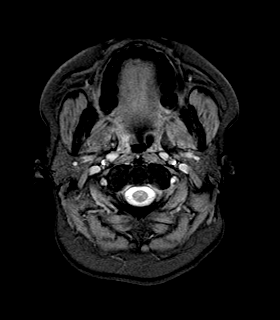

[Series 10: T1 · axial · 1.0mm · 1.00mm/px · z∈[-42,+116]mm · 10 of 160 slices shown (2 of 2)]
[im 1/160]
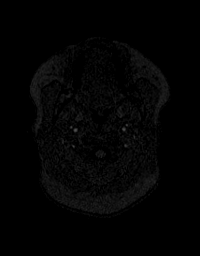
[im 18/160]
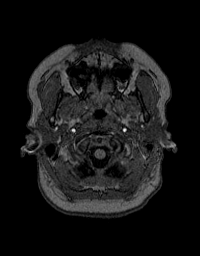
[im 36/160]
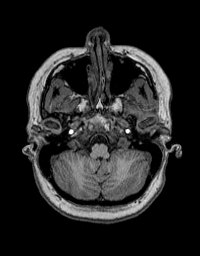
[im 54/160]
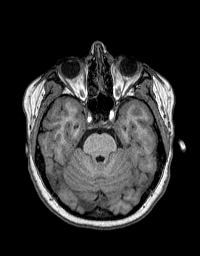
[im 71/160]
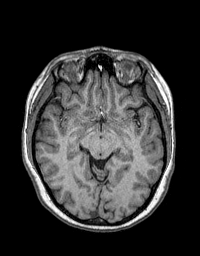
[im 89/160]
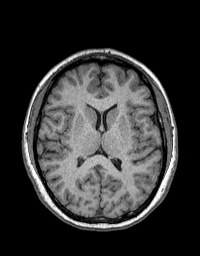
[im 107/160]
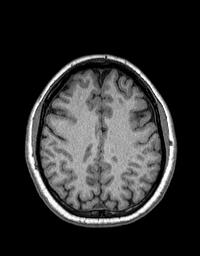
[im 124/160]
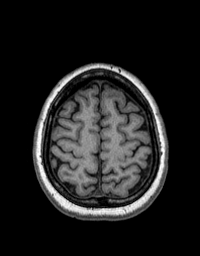
[im 142/160]
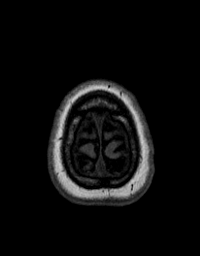
[im 160/160]
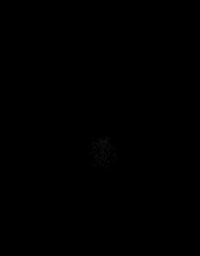

[Series 11: T2 post-contrast · coronal · 5.0mm · 0.43mm/px · 2 of 29 slices shown]
[im 1/29]
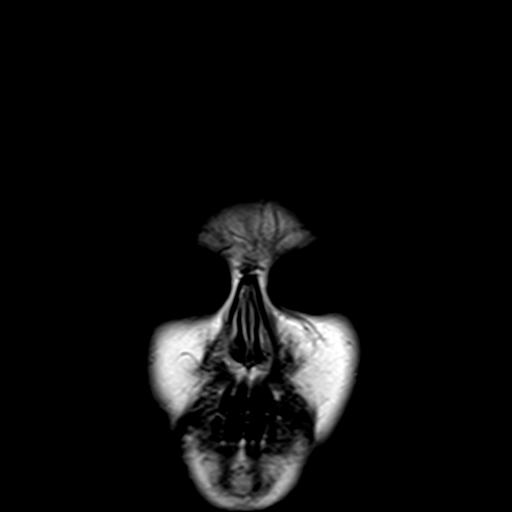
[im 29/29]
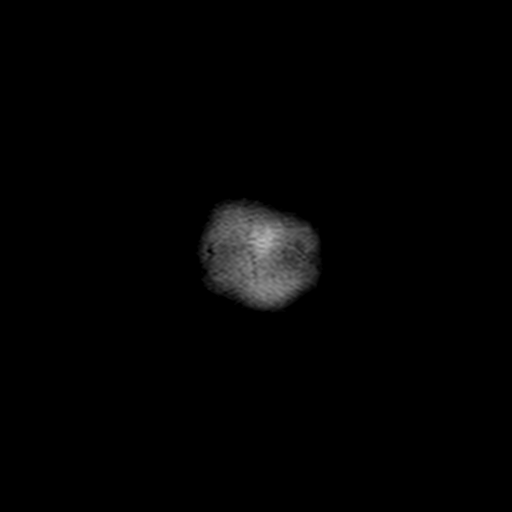

[Series 12: T1 post-contrast · axial · 1.0mm · 1.00mm/px · z∈[-42,+116]mm · 10 of 160 slices shown (1 of 3)]
[im 1/160]
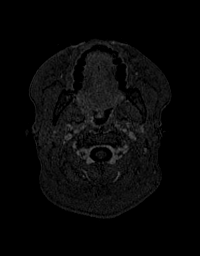
[im 18/160]
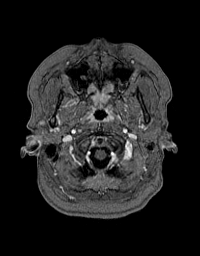
[im 36/160]
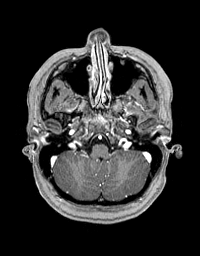
[im 54/160]
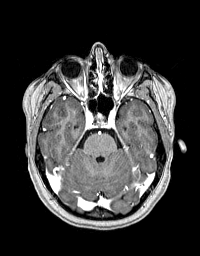
[im 71/160]
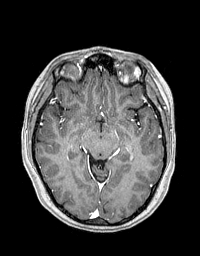
[im 89/160]
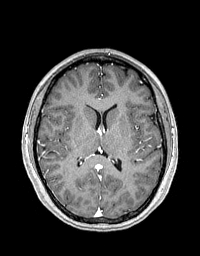
[im 107/160]
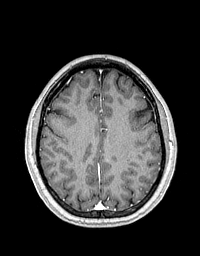
[im 124/160]
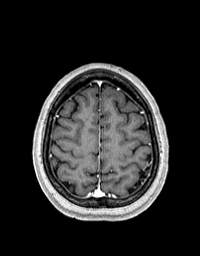
[im 142/160]
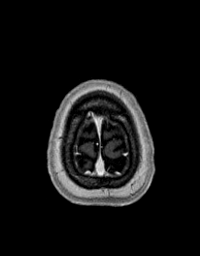
[im 160/160]
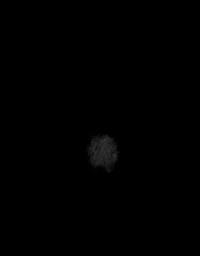

[Series 13: T1 post-contrast · coronal · 5.0mm · 0.43mm/px · 2 of 29 slices shown (2 of 3)]
[im 1/29]
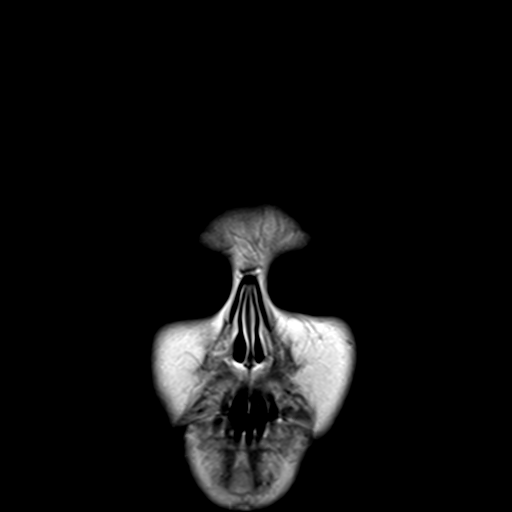
[im 29/29]
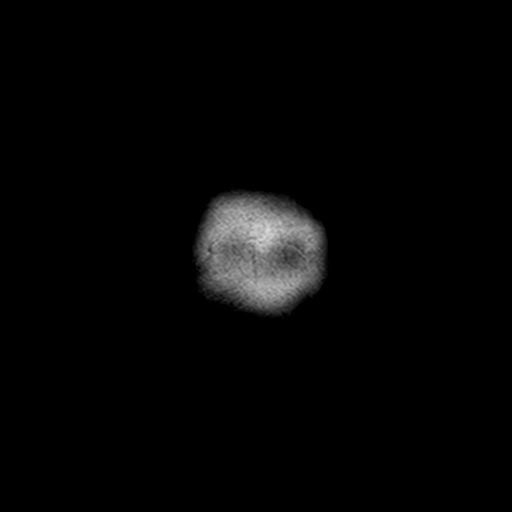

[Series 14: T1 post-contrast · sagittal · 5.0mm · 0.45mm/px · 1 of 23 slices shown (3 of 3)]
[im 1/23]
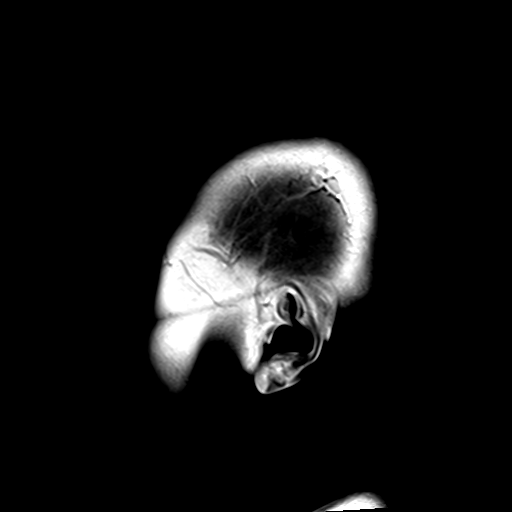

[48 of 48 positions shown; findings below may reference images not displayed]

FINDINGS: Brain: No acute infarction, hemorrhage, hydrocephalus, extra-axial
collection or mass lesion. No white matter disease or atrophy.

Vascular: Normal flow voids and vascular enhancements

Skull and upper cervical spine: Normal marrow signal

Sinuses/Orbits: No evidence of orbital inflammation. Patchy mucosal
thickening in the paranasal sinuses.
IMPRESSION: Negative brain MRI.  No explanation for symptoms.

## 2020-02-07 MED ORDER — GADOBUTROL 1 MMOL/ML IV SOLN
10.0000 mL | Freq: Once | INTRAVENOUS | Status: AC | PRN
  Administered 2020-02-07: 10 mL via INTRAVENOUS

## 2020-02-08 NOTE — Addendum Note (Signed)
Addended by: Deirdre Pippins on: 02/08/2020 01:02 PM   Modules accepted: Orders

## 2020-03-28 DIAGNOSIS — L0291 Cutaneous abscess, unspecified: Secondary | ICD-10-CM | POA: Diagnosis not present

## 2020-03-28 DIAGNOSIS — L700 Acne vulgaris: Secondary | ICD-10-CM | POA: Diagnosis not present

## 2020-03-28 DIAGNOSIS — L4050 Arthropathic psoriasis, unspecified: Secondary | ICD-10-CM | POA: Diagnosis not present

## 2020-03-28 DIAGNOSIS — L309 Dermatitis, unspecified: Secondary | ICD-10-CM | POA: Diagnosis not present

## 2020-03-28 DIAGNOSIS — L408 Other psoriasis: Secondary | ICD-10-CM | POA: Diagnosis not present

## 2020-04-18 ENCOUNTER — Ambulatory Visit: Payer: BC Managed Care – PPO | Admitting: Neurology

## 2020-04-18 ENCOUNTER — Encounter: Payer: Self-pay | Admitting: Neurology

## 2020-04-18 VITALS — BP 122/79 | HR 66 | Ht 64.0 in | Wt 239.0 lb

## 2020-04-18 DIAGNOSIS — G43709 Chronic migraine without aura, not intractable, without status migrainosus: Secondary | ICD-10-CM

## 2020-04-18 DIAGNOSIS — R413 Other amnesia: Secondary | ICD-10-CM | POA: Diagnosis not present

## 2020-04-18 DIAGNOSIS — R259 Unspecified abnormal involuntary movements: Secondary | ICD-10-CM | POA: Diagnosis not present

## 2020-04-18 DIAGNOSIS — E538 Deficiency of other specified B group vitamins: Secondary | ICD-10-CM | POA: Diagnosis not present

## 2020-04-18 DIAGNOSIS — R7989 Other specified abnormal findings of blood chemistry: Secondary | ICD-10-CM | POA: Diagnosis not present

## 2020-04-18 DIAGNOSIS — R799 Abnormal finding of blood chemistry, unspecified: Secondary | ICD-10-CM | POA: Diagnosis not present

## 2020-04-18 MED ORDER — RIZATRIPTAN BENZOATE 10 MG PO TABS: 10.0000 mg | ORAL_TABLET | ORAL | 6 refills | Status: DC | PRN

## 2020-04-18 MED ORDER — TOPIRAMATE 50 MG PO TABS: 100.0000 mg | ORAL_TABLET | Freq: Two times a day (BID) | ORAL | 11 refills | Status: DC

## 2020-04-18 NOTE — Progress Notes (Signed)
Chief Complaint  Patient presents with  . New Patient (Initial Visit)    Hx of migraines. Tried sumatriptan in the past without relief. Never tried any preventive meds. Reports episodes of sudden, sharp pains in the left side of her head. When this occurs, she has vision loss in left eye and she has involuntary contractions in her left shoulder. Symptoms only last seconds and resolve on their own. She has recent normal eye exam and normal MRI brain.   Marland Kitchen PCP    Sunnie Nielsen, DO    HISTORICAL  Ashlee Newton is a 24 year old female, seen in request by her primary care physician Dr. Sunnie Nielsen for evaluation of migraine headache, initial evaluation was on April 18, 2020.  I reviewed and summarized the referring note.  Past medical history Asthma.  She had long history of migraine headache, become daily since 2018, she describes her headache as bilateral temporal region mild to moderate pressure, sometimes settled at one site pounding, with occasionally noise smell sensitivity, no significant light sensitivity, movement made it worse, taking a nap always helpful  She has tried over-the-counter medications without significant help, she is not taking medicine regularly, but complains of mild daily headaches, she works 2 jobs, also Gaffer on attending [employer],  She also complains of mild memory loss, forgot glasses on the top of her head, word searching the middle of the sentence,  I personally reviewed MRI of the brain without contrast February 07, 2020 that was normal  Laboratory evaluation in 2021: Negative hepatitis A,B,C, negative HIV,  REVIEW OF SYSTEMS: Full 14 system review of systems performed and notable only for as above All other review of systems were negative.  ALLERGIES: Allergies  Allergen Reactions  . Contrast Media [Iodinated Diagnostic Agents] Rash    HOME MEDICATIONS: Current Outpatient Medications  Medication Sig Dispense Refill  .  levocetirizine (XYZAL) 5 MG tablet Take by mouth.    . levonorgestrel (MIRENA) 20 MCG/24HR IUD 1 each by Intrauterine route once. Mirena IUD placed 06/25/2019    . lidocaine (XYLOCAINE) 2 % solution Use as directed 5-10 mLs in the mouth or throat as needed for mouth pain. 100 mL 1  . methocarbamol (ROBAXIN) 500 MG tablet Take 1 tablet (500 mg total) by mouth every 6 (six) hours as needed for muscle spasms. 60 tablet 1  . montelukast (SINGULAIR) 10 MG tablet Take by mouth.    . triamcinolone cream (KENALOG) 0.1 % Apply 1 application topically 2 (two) times daily. 30 g 0  . diphenhydrAMINE (BENADRYL) 50 MG tablet Take 1 tablet (50 mg total) by mouth once for 1 dose. 1 hour prior to contrast administration 2 tablet 0   No current facility-administered medications for this visit.    PAST MEDICAL HISTORY: Past Medical History:  Diagnosis Date  . Allergies   . Allergy   . Head pain   . Migraine     PAST SURGICAL HISTORY: Past Surgical History:  Procedure Laterality Date  . labrial repair    . TONSILLECTOMY AND ADENOIDECTOMY    . TYMPANOSTOMY TUBE PLACEMENT      FAMILY HISTORY: Family History  Problem Relation Age of Onset  . Migraines Mother   . Hypercholesterolemia Mother   . Hypertension Father   . Cancer Maternal Grandfather        pancreatitis --> pancreatic CA    SOCIAL HISTORY: Social History   Socioeconomic History  . Marital status: Single    Spouse name: Not on  file  . Number of children: 0  . Years of education: college  . Highest education level: Bachelor's degree (e.g., BA, AB, BS)  Occupational History  . Occupation: Museum/gallery conservator  . Occupation: registration for [employer]  Tobacco Use  . Smoking status: Never Smoker  . Smokeless tobacco: Never Used  Vaping Use  . Vaping Use: Never used  Substance and Sexual Activity  . Alcohol use: Not Currently  . Drug use: No  . Sexual activity: Yes    Partners: Male    Birth control/protection: I.U.D.  Other Topics Concern    . Not on file  Social History Narrative   Lives with her sister.   Right-handed.   Caffeine use: 2-3 cups per day.   Social Determinants of Health   Financial Resource Strain:   . Difficulty of Paying Living Expenses: Not on file  Food Insecurity:   . Worried About Programme researcher, broadcasting/film/video in the Last Year: Not on file  . Ran Out of Food in the Last Year: Not on file  Transportation Needs:   . Lack of Transportation (Medical): Not on file  . Lack of Transportation (Non-Medical): Not on file  Physical Activity:   . Days of Exercise per Week: Not on file  . Minutes of Exercise per Session: Not on file  Stress:   . Feeling of Stress : Not on file  Social Connections:   . Frequency of Communication with Friends and Family: Not on file  . Frequency of Social Gatherings with Friends and Family: Not on file  . Attends Religious Services: Not on file  . Active Member of Clubs or Organizations: Not on file  . Attends Banker Meetings: Not on file  . Marital Status: Not on file  Intimate Partner Violence:   . Fear of Current or Ex-Partner: Not on file  . Emotionally Abused: Not on file  . Physically Abused: Not on file  . Sexually Abused: Not on file     PHYSICAL EXAM   Vitals:   04/18/20 0958  BP: 122/79  Pulse: 66  Weight: 239 lb (108.4 kg)  Height: 5\' 4"  (1.626 m)   Not recorded     Body mass index is 41.02 kg/m.  PHYSICAL EXAMNIATION:  Gen: NAD, conversant, well nourised, well groomed                     Cardiovascular: Regular rate rhythm, no peripheral edema, warm, nontender. Eyes: Conjunctivae clear without exudates or hemorrhage Neck: Supple, no carotid bruits. Pulmonary: Clear to auscultation bilaterally   NEUROLOGICAL EXAM:  MENTAL STATUS: Speech:    Speech is normal; fluent and spontaneous with normal comprehension.  Cognition:     Orientation to time, place and person     Normal recent and remote memory     Normal Attention span and  concentration     Normal Language, naming, repeating,spontaneous speech     Fund of knowledge   CRANIAL NERVES: CN II: Visual fields are full to confrontation. Pupils are round equal and briskly reactive to light. CN III, IV, VI: extraocular movement are normal. No ptosis. CN V: Facial sensation is intact to light touch CN VII: Face is symmetric with normal eye closure  CN VIII: Hearing is normal to causal conversation. CN IX, X: Phonation is normal. CN XI: Head turning and shoulder shrug are intact  MOTOR: There is no pronator drift of out-stretched arms. Muscle bulk and tone are normal. Muscle strength is  normal.  REFLEXES: Reflexes are 2+ and symmetric at the biceps, triceps, knees, and ankles. Plantar responses are flexor.  SENSORY: Intact to light touch, pinprick and vibratory sensation are intact in fingers and toes.  COORDINATION: There is no trunk or limb dysmetria noted.  GAIT/STANCE: Posture is normal. Gait is steady with normal steps, base, arm swing, and turning. Heel and toe walking are normal. Tandem gait is normal.  Romberg is absent.   DIAGNOSTIC DATA (LABS, IMAGING, TESTING) - I reviewed patient records, labs, notes, testing and imaging myself where available.   ASSESSMENT AND PLAN  ODIE RAUEN is a 24 y.o. female   Chronic migraine headaches Memory loss  Normal MRI of the brain,  Start preventive medications Topamax 50 mg titrating to 100 mg every night as preventive medication  Maxalt as needed  Laboratory evaluation to rule out treatable etiology     Levert Feinstein, M.D. Ph.D.  [facility] [address], [address] [place], [place] [address] Ph: [phone] Fax: [phone]  CC:  Sunnie Nielsen, DO [address] [facility] [address] [place],  [place] [address]

## 2020-04-19 LAB — CBC WITH DIFFERENTIAL/PLATELET
Basophils Absolute: 0 10*3/uL (ref 0.0–0.2)
Basos: 0 %
EOS (ABSOLUTE): 0.3 10*3/uL (ref 0.0–0.4)
Eos: 4 %
Hematocrit: 39.9 % (ref 34.0–46.6)
Hemoglobin: 13.6 g/dL (ref 11.1–15.9)
Immature Grans (Abs): 0 10*3/uL (ref 0.0–0.1)
Immature Granulocytes: 0 %
Lymphocytes Absolute: 2 10*3/uL (ref 0.7–3.1)
Lymphs: 35 %
MCH: 28.9 pg (ref 26.6–33.0)
MCHC: 34.1 g/dL (ref 31.5–35.7)
MCV: 85 fL (ref 79–97)
Monocytes Absolute: 0.6 10*3/uL (ref 0.1–0.9)
Monocytes: 10 %
Neutrophils Absolute: 2.9 10*3/uL (ref 1.4–7.0)
Neutrophils: 51 %
Platelets: 361 10*3/uL (ref 150–450)
RBC: 4.71 x10E6/uL (ref 3.77–5.28)
RDW: 12.5 % (ref 11.7–15.4)
WBC: 5.8 10*3/uL (ref 3.4–10.8)

## 2020-04-19 LAB — COMPREHENSIVE METABOLIC PANEL
ALT: 24 IU/L (ref 0–32)
AST: 19 IU/L (ref 0–40)
Albumin/Globulin Ratio: 1.6 (ref 1.2–2.2)
Albumin: 4.2 g/dL (ref 3.9–5.0)
Alkaline Phosphatase: 85 IU/L (ref 44–121)
BUN/Creatinine Ratio: 11 (ref 9–23)
BUN: 9 mg/dL (ref 6–20)
Bilirubin Total: 0.3 mg/dL (ref 0.0–1.2)
CO2: 25 mmol/L (ref 20–29)
Calcium: 9.5 mg/dL (ref 8.7–10.2)
Chloride: 102 mmol/L (ref 96–106)
Creatinine, Ser: 0.85 mg/dL (ref 0.57–1.00)
GFR calc Af Amer: 111 mL/min/{1.73_m2} (ref >59)
GFR calc non Af Amer: 96 mL/min/{1.73_m2} (ref >59)
Globulin, Total: 2.7 g/dL (ref 1.5–4.5)
Glucose: 86 mg/dL (ref 65–99)
Potassium: 4.6 mmol/L (ref 3.5–5.2)
Sodium: 139 mmol/L (ref 134–144)
Total Protein: 6.9 g/dL (ref 6.0–8.5)

## 2020-04-19 LAB — TSH: TSH: 1.46 u[IU]/mL (ref 0.450–4.500)

## 2020-04-19 LAB — RPR: RPR Ser Ql: NONREACTIVE

## 2020-04-19 LAB — VITAMIN B12: Vitamin B-12: 453 pg/mL (ref 232–1245)

## 2020-04-25 DIAGNOSIS — L700 Acne vulgaris: Secondary | ICD-10-CM | POA: Diagnosis not present

## 2020-04-25 DIAGNOSIS — L408 Other psoriasis: Secondary | ICD-10-CM | POA: Diagnosis not present

## 2020-04-27 ENCOUNTER — Ambulatory Visit: Payer: BC Managed Care – PPO | Admitting: Neurology

## 2020-04-27 DIAGNOSIS — R410 Disorientation, unspecified: Secondary | ICD-10-CM | POA: Diagnosis not present

## 2020-04-27 DIAGNOSIS — R413 Other amnesia: Secondary | ICD-10-CM

## 2020-04-27 DIAGNOSIS — R259 Unspecified abnormal involuntary movements: Secondary | ICD-10-CM

## 2020-04-27 DIAGNOSIS — G43709 Chronic migraine without aura, not intractable, without status migrainosus: Secondary | ICD-10-CM

## 2020-05-04 ENCOUNTER — Telehealth: Payer: Self-pay | Admitting: Neurology

## 2020-05-04 DIAGNOSIS — R413 Other amnesia: Secondary | ICD-10-CM

## 2020-05-04 NOTE — Telephone Encounter (Signed)
Please call patient, her EEG showed mild to moderate background slowing, which is more than expected for a healthy 24 years old  I have put in an order for repeat sleep deprived EEG

## 2020-05-04 NOTE — Procedures (Signed)
   HISTORY: 24 year old female with history of migraine headaches, also complains of memory loss TECHNIQUE:  This is a routine 16 channel EEG recording with one channel devoted to a limited EKG recording.  It was performed during wakefulness, drowsiness and asleep.  Hyperventilation and photic stimulation were performed as activating procedures.  There are minimum muscle and movement artifact noted.  Upon maximum arousal, posterior dominant waking rhythm consistent of moderately slow dysrhythmic activity, in the theta range, 5 Hz. Activities are symmetric over the bilateral posterior derivations and attenuated with eye opening.  Hyperventilation produced mild/moderate buildup with higher amplitude and the slower activities noted.  Photic stimulation did not alter the tracing.  During EEG recording, patient developed drowsiness and no deeper stage of sleep was achieved  During EEG recording, there was no epileptiform discharge noted.  EKG demonstrate sinus rhythm, with heart rate of 76 bpm  CONCLUSION: This is an abnormal EEG.  There is evidence of moderate background slowing, indicating moderate bihemispheric malfunction.  There was no evidence of epileptiform discharge.  Levert Feinstein, M.D. Ph.D.  [facility] [address] [place], [place] [address] Phone: [phone] Fax:      [phone]

## 2020-05-04 NOTE — Telephone Encounter (Signed)
I spoke to the patient and she is aware of the results. She is in agreement to move forward with sleep deprived EEG. She knows to expect a call for scheduling, along with instructions.

## 2020-05-08 DIAGNOSIS — J3081 Allergic rhinitis due to animal (cat) (dog) hair and dander: Secondary | ICD-10-CM | POA: Diagnosis not present

## 2020-05-08 DIAGNOSIS — J3089 Other allergic rhinitis: Secondary | ICD-10-CM | POA: Diagnosis not present

## 2020-05-08 DIAGNOSIS — J301 Allergic rhinitis due to pollen: Secondary | ICD-10-CM | POA: Diagnosis not present

## 2020-05-08 NOTE — Progress Notes (Signed)
I have called Ashlee Newton, EEG showed mild to moderate background slowing, I have ordered sleep deprived EEG

## 2020-05-09 DIAGNOSIS — J301 Allergic rhinitis due to pollen: Secondary | ICD-10-CM | POA: Diagnosis not present

## 2020-05-09 DIAGNOSIS — J3081 Allergic rhinitis due to animal (cat) (dog) hair and dander: Secondary | ICD-10-CM | POA: Diagnosis not present

## 2020-05-09 DIAGNOSIS — J3089 Other allergic rhinitis: Secondary | ICD-10-CM | POA: Diagnosis not present

## 2020-05-11 ENCOUNTER — Ambulatory Visit: Payer: BC Managed Care – PPO

## 2020-05-11 DIAGNOSIS — R410 Disorientation, unspecified: Secondary | ICD-10-CM | POA: Diagnosis not present

## 2020-05-11 DIAGNOSIS — R413 Other amnesia: Secondary | ICD-10-CM

## 2020-05-23 DIAGNOSIS — L700 Acne vulgaris: Secondary | ICD-10-CM | POA: Diagnosis not present

## 2020-05-23 DIAGNOSIS — Z79899 Other long term (current) drug therapy: Secondary | ICD-10-CM | POA: Diagnosis not present

## 2020-06-05 DIAGNOSIS — J301 Allergic rhinitis due to pollen: Secondary | ICD-10-CM | POA: Diagnosis not present

## 2020-06-05 DIAGNOSIS — J3081 Allergic rhinitis due to animal (cat) (dog) hair and dander: Secondary | ICD-10-CM | POA: Diagnosis not present

## 2020-06-05 DIAGNOSIS — J3089 Other allergic rhinitis: Secondary | ICD-10-CM | POA: Diagnosis not present

## 2020-06-08 DIAGNOSIS — J3081 Allergic rhinitis due to animal (cat) (dog) hair and dander: Secondary | ICD-10-CM | POA: Diagnosis not present

## 2020-06-14 ENCOUNTER — Encounter: Payer: Self-pay | Admitting: Neurology

## 2020-06-14 ENCOUNTER — Ambulatory Visit: Payer: BC Managed Care – PPO | Admitting: Neurology

## 2020-06-14 VITALS — BP 130/87 | HR 80 | Ht 64.0 in | Wt 230.0 lb

## 2020-06-14 DIAGNOSIS — G43709 Chronic migraine without aura, not intractable, without status migrainosus: Secondary | ICD-10-CM

## 2020-06-14 DIAGNOSIS — R0602 Shortness of breath: Secondary | ICD-10-CM | POA: Diagnosis not present

## 2020-06-14 DIAGNOSIS — J3081 Allergic rhinitis due to animal (cat) (dog) hair and dander: Secondary | ICD-10-CM | POA: Diagnosis not present

## 2020-06-14 DIAGNOSIS — J301 Allergic rhinitis due to pollen: Secondary | ICD-10-CM | POA: Diagnosis not present

## 2020-06-14 DIAGNOSIS — J3089 Other allergic rhinitis: Secondary | ICD-10-CM | POA: Diagnosis not present

## 2020-06-14 MED ORDER — VENLAFAXINE HCL ER 37.5 MG PO CP24: ORAL_CAPSULE | ORAL | 11 refills | Status: DC

## 2020-06-14 NOTE — Progress Notes (Signed)
PATIENT: Ashlee Newton DOB: 1995/06/12  REASON FOR VISIT: follow up HISTORY FROM: patient  HISTORY OF PRESENT ILLNESS: Today 06/14/20  HISTORY Ashlee Newton is a 25 year old female, seen in request by her primary care physician Dr. Sunnie Nielsen for evaluation of migraine headache, initial evaluation was on April 18, 2020.  I reviewed and summarized the referring note.  Past medical history Asthma.  She had long history of migraine headache, become daily since 2018, she describes her headache as bilateral temporal region mild to moderate pressure, sometimes settled at one site pounding, with occasionally noise smell sensitivity, no significant light sensitivity, movement made it worse, taking a nap always helpful  She has tried over-the-counter medications without significant help, she is not taking medicine regularly, but complains of mild daily headaches, she works 2 jobs, also Gaffer on attending [employer],  She also complains of mild memory loss, forgot glasses on the top of her head, word searching the middle of the sentence,  I personally reviewed MRI of the brain without contrast February 07, 2020 that was normal  Laboratory evaluation in 2021: Negative hepatitis A,B,C, negative HIV,  Update June 14, 2020 SS: here today alone, taking Topamax 100 mg twice daily, no better with medications, migraines can't remember last day without one for several months, on good days localized frontal area, bad days entire head, some mornings feels hit with baseball bat occipital area. No migraine features, no nausea, not sensitive to light, her mom had migraines with typical symptoms, mom did great Topamax. Feels Topamax has made her worst, her headaches gotten worse, no side effects from Topamax per see. 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 Works 40 hours a week, in school at [employer] full time.  Still has spells of in middle of sentence gets  distracted very easy, her brother had ADHD, she has never been tested. Is forgetful, mind goes blank. Didn't start happening until headaches became more frequent in 2018. Trouble falling asleep at times, in general sleeps well. Last few mornings waking up with double vision, sees eye doctor, fluid build up optic nerve, observation, got reading glasses. Reviewed sleep deprived EEG with Dr. Terrace Arabia, no significant abnormalities, no seizures noted, but artifact present.   REVIEW OF SYSTEMS: Out of a complete 14 system review of symptoms, the patient complains only of the following symptoms, and all other reviewed systems are negative.  Headache   ALLERGIES: Allergies  Allergen Reactions  . Contrast Media [Iodinated Diagnostic Agents] Rash    HOME MEDICATIONS: Outpatient Medications Prior to Visit  Medication Sig Dispense Refill  . levocetirizine (XYZAL) 5 MG tablet Take by mouth.    . levonorgestrel (MIRENA) 20 MCG/24HR IUD 1 each by Intrauterine route once. Mirena IUD placed 06/25/2019    . lidocaine (XYLOCAINE) 2 % solution Use as directed 5-10 mLs in the mouth or throat as needed for mouth pain. 100 mL 1  . methocarbamol (ROBAXIN) 500 MG tablet Take 1 tablet (500 mg total) by mouth every 6 (six) hours as needed for muscle spasms. 60 tablet 1  . montelukast (SINGULAIR) 10 MG tablet Take by mouth.    . rizatriptan (MAXALT) 10 MG tablet Take 1 tablet (10 mg total) by mouth as needed for migraine. May repeat in 2 hours if needed 10 tablet 6  . spironolactone (ALDACTONE) 50 MG tablet Take 50 mg by mouth daily.    Marland Kitchen topiramate (TOPAMAX) 50 MG tablet Take 2 tablets (100  mg total) by mouth 2 (two) times daily. 60 tablet 11  . triamcinolone cream (KENALOG) 0.1 % Apply 1 application topically 2 (two) times daily. 30 g 0  . diphenhydrAMINE (BENADRYL) 50 MG tablet Take 1 tablet (50 mg total) by mouth once for 1 dose. 1 hour prior to contrast administration 2 tablet 0   No facility-administered medications  prior to visit.    PAST MEDICAL HISTORY: Past Medical History:  Diagnosis Date  . Allergies   . Allergy   . Head pain   . Migraine     PAST SURGICAL HISTORY: Past Surgical History:  Procedure Laterality Date  . labrial repair    . TONSILLECTOMY AND ADENOIDECTOMY    . TYMPANOSTOMY TUBE PLACEMENT      FAMILY HISTORY: Family History  Problem Relation Age of Onset  . Migraines Mother   . Hypercholesterolemia Mother   . Hypertension Father   . Cancer Maternal Grandfather        pancreatitis --> pancreatic CA    SOCIAL HISTORY: Social History   Socioeconomic History  . Marital status: Single    Spouse name: Not on file  . Number of children: 0  . Years of education: college  . Highest education level: Bachelor's degree (e.g., BA, AB, BS)  Occupational History  . Occupation: Museum/gallery conservator  . Occupation: registration for [employer]  Tobacco Use  . Smoking status: Never Smoker  . Smokeless tobacco: Never Used  Vaping Use  . Vaping Use: Never used  Substance and Sexual Activity  . Alcohol use: Not Currently  . Drug use: No  . Sexual activity: Yes    Partners: Male    Birth control/protection: I.U.D.  Other Topics Concern  . Not on file  Social History Narrative   Lives with her sister.   Right-handed.   Caffeine use: 2-3 cups per day.   Social Determinants of Health   Financial Resource Strain: Not on file  Food Insecurity: Not on file  Transportation Needs: Not on file  Physical Activity: Not on file  Stress: Not on file  Social Connections: Not on file  Intimate Partner Violence: Not on file   PHYSICAL EXAM  Vitals:   06/14/20 1340  BP: 130/87  Pulse: 80  Weight: 230 lb (104.3 kg)  Height: 5\' 4"  (1.626 m)   Body mass index is 39.48 kg/m.  Generalized: Well developed, in no acute distress   Neurological examination  Mentation: Alert oriented to time, place, history taking. Follows all commands speech and language fluent Cranial nerve II-XII: Pupils  were equal round reactive to light. Extraocular movements were full, visual field were full on confrontational test. Facial sensation and strength were normal.  Head turning and shoulder shrug  were normal and symmetric. Motor: The motor testing reveals 5 over 5 strength of all 4 extremities. Good symmetric motor tone is noted throughout.  Sensory: Sensory testing is intact to soft touch on all 4 extremities. No evidence of extinction is noted.  Coordination: Cerebellar testing reveals good finger-nose-finger and heel-to-shin bilaterally.  Gait and station: Gait is normal. Tandem gait is normal. Romberg is negative. No drift is seen.  Reflexes: Deep tendon reflexes are symmetric and normal bilaterally.   DIAGNOSTIC DATA (LABS, IMAGING, TESTING) - I reviewed patient records, labs, notes, testing and imaging myself where available.  Lab Results  Component Value Date   WBC 5.8 04/18/2020   HGB 13.6 04/18/2020   HCT 39.9 04/18/2020   MCV 85 04/18/2020   PLT 361  04/18/2020      Component Value Date/Time   NA 139 04/18/2020 1106   K 4.6 04/18/2020 1106   CL 102 04/18/2020 1106   CO2 25 04/18/2020 1106   GLUCOSE 86 04/18/2020 1106   GLUCOSE 74 12/07/2012 1702   BUN 9 04/18/2020 1106   CREATININE 0.85 04/18/2020 1106   CREATININE 0.81 12/07/2012 1702   CALCIUM 9.5 04/18/2020 1106   PROT 6.9 04/18/2020 1106   ALBUMIN 4.2 04/18/2020 1106   AST 19 04/18/2020 1106   ALT 24 04/18/2020 1106   ALKPHOS 85 04/18/2020 1106   BILITOT 0.3 04/18/2020 1106   GFRNONAA 96 04/18/2020 1106   GFRAA 111 04/18/2020 1106   No results found for: CHOL, HDL, LDLCALC, LDLDIRECT, TRIG, CHOLHDL No results found for: PPIR5J Lab Results  Component Value Date   VITAMINB12 453 04/18/2020   Lab Results  Component Value Date   TSH 1.460 04/18/2020      ASSESSMENT AND PLAN 25 y.o. year old female  has a past medical history of Allergies, Allergy, Head pain, and Migraine. here with:  1.  Chronic migraine  headaches 2.  Memory loss -MRI of the brain was normal -EEG showed mild to moderate background slowing, more than expected for a healthy 25 year old, sleep deprived EEG no significant abnormalities, no seizures, but artifact present, at next visit see if can hook up to EEG to repeat -Laboratory evaluation (B12, RPR, CBC, CMP, TSH) were normal -No benefit with Topamax, feels has worsened headaches, will discontinue -Start Effexor XR working up to 75 mg at bedtime for migraine prevention -Continue Maxalt as needed for acute headache -Follow-up in 3 months or sooner if needed  I spent 30 minutes of face-to-face and non-face-to-face time with patient.  This included previsit chart review, lab review, study review, order entry, electronic health record documentation, patient education.  Margie Ege, AGNP-C, DNP 06/14/2020, 4:35 PM [facility] [address] [place], [place] [address] [phone]

## 2020-06-14 NOTE — Telephone Encounter (Signed)
I called the patient. She is still having frequent migraines despite taking topiramate 50mg , two tabs BID. Her appt has been moved to an earlier date to discuss alternate treatments.

## 2020-06-20 DIAGNOSIS — J3081 Allergic rhinitis due to animal (cat) (dog) hair and dander: Secondary | ICD-10-CM | POA: Diagnosis not present

## 2020-06-21 DIAGNOSIS — L409 Psoriasis, unspecified: Secondary | ICD-10-CM | POA: Diagnosis not present

## 2020-06-21 DIAGNOSIS — M2550 Pain in unspecified joint: Secondary | ICD-10-CM | POA: Diagnosis not present

## 2020-06-22 DIAGNOSIS — J3081 Allergic rhinitis due to animal (cat) (dog) hair and dander: Secondary | ICD-10-CM | POA: Diagnosis not present

## 2020-06-27 ENCOUNTER — Encounter (HOSPITAL_COMMUNITY): Payer: Self-pay | Admitting: Occupational Therapy

## 2020-06-27 DIAGNOSIS — J3081 Allergic rhinitis due to animal (cat) (dog) hair and dander: Secondary | ICD-10-CM | POA: Diagnosis not present

## 2020-06-27 NOTE — Therapy (Signed)
[facility] [place], [place], [address] Phone: [phone]   Fax:  [phone]  Patient Details  Name: Ashlee Newton MRN: [id number] Date of Birth: [birth year] Referring Provider:  No ref. provider found  Encounter Date: 06/27/2020   OCCUPATIONAL THERAPY DISCHARGE SUMMARY  Visits from Start of Care: 3  Current functional level related to goals / functional outcomes: Unknown. Pt cancelled her last few appts and did not return for any additional therapy.    Remaining deficits: Unknown.    Education / Equipment: HEP at initial eval Plan: Patient agrees to discharge.  Patient goals were not met. Patient is being discharged due to not returning since the last visit.  ?????      Guadelupe Sabin, OTR/L  [phone] 06/27/2020, 1:40 PM  [facility] [address] [place], [place], [address] Phone: [phone]   Fax:  [phone]

## 2020-06-27 NOTE — Procedures (Signed)
   HISTORY: 25 years old female with history of migraine headache also complains of memory loss TECHNIQUE:  This is a routine 16 channel EEG recording with one channel devoted to a limited EKG recording.  It was performed during wakefulness, drowsiness and asleep.  Hyperventilation and photic stimulation were performed as activating procedures.  There are frequent movement artifact.  Upon maximum arousal, posterior dominant waking rhythm consistent of rhythmic alpha range activity, with frequency of 8 to 9  hz. Activities are symmetric over the bilateral posterior derivations and attenuated with eye opening.  Hyperventilation produced mild/moderate buildup with higher amplitude and the slower activities noted.  Photic stimulation did not alter the tracing.  During EEG recording, patient developed drowsiness and no deeper stage of sleep was achieved noted.  During EEG recording, there was no epileptiform discharge noted.  EKG demonstrate sinus rhythm, with heart rate of  CONCLUSION: This is a  normal awake yet technically difficult EEG due to frequent movement artifact.  There is no electrodiagnostic evidence of epileptiform discharge.  Levert Feinstein, M.D. Ph.D.  [facility] [address] [place], [place] [address] Phone: [phone] Fax:      [phone]

## 2020-06-29 DIAGNOSIS — J3081 Allergic rhinitis due to animal (cat) (dog) hair and dander: Secondary | ICD-10-CM | POA: Diagnosis not present

## 2020-07-06 DIAGNOSIS — J3081 Allergic rhinitis due to animal (cat) (dog) hair and dander: Secondary | ICD-10-CM | POA: Diagnosis not present

## 2020-07-11 ENCOUNTER — Other Ambulatory Visit: Payer: Self-pay | Admitting: Neurology

## 2020-07-11 DIAGNOSIS — J3081 Allergic rhinitis due to animal (cat) (dog) hair and dander: Secondary | ICD-10-CM | POA: Diagnosis not present

## 2020-07-13 DIAGNOSIS — J3081 Allergic rhinitis due to animal (cat) (dog) hair and dander: Secondary | ICD-10-CM | POA: Diagnosis not present

## 2020-07-19 ENCOUNTER — Ambulatory Visit: Payer: BC Managed Care – PPO | Admitting: Neurology

## 2020-07-19 DIAGNOSIS — M2550 Pain in unspecified joint: Secondary | ICD-10-CM | POA: Diagnosis not present

## 2020-07-19 DIAGNOSIS — L409 Psoriasis, unspecified: Secondary | ICD-10-CM | POA: Diagnosis not present

## 2020-07-19 DIAGNOSIS — L4059 Other psoriatic arthropathy: Secondary | ICD-10-CM | POA: Diagnosis not present

## 2020-07-24 DIAGNOSIS — D1801 Hemangioma of skin and subcutaneous tissue: Secondary | ICD-10-CM | POA: Diagnosis not present

## 2020-07-24 DIAGNOSIS — L404 Guttate psoriasis: Secondary | ICD-10-CM | POA: Diagnosis not present

## 2020-07-24 DIAGNOSIS — L700 Acne vulgaris: Secondary | ICD-10-CM | POA: Diagnosis not present

## 2020-07-24 DIAGNOSIS — L578 Other skin changes due to chronic exposure to nonionizing radiation: Secondary | ICD-10-CM | POA: Diagnosis not present

## 2020-07-24 DIAGNOSIS — L910 Hypertrophic scar: Secondary | ICD-10-CM | POA: Diagnosis not present

## 2020-07-24 DIAGNOSIS — L814 Other melanin hyperpigmentation: Secondary | ICD-10-CM | POA: Diagnosis not present

## 2020-08-16 ENCOUNTER — Ambulatory Visit (INDEPENDENT_AMBULATORY_CARE_PROVIDER_SITE_OTHER): Payer: BC Managed Care – PPO | Admitting: Obstetrics and Gynecology

## 2020-08-16 ENCOUNTER — Encounter: Payer: Self-pay | Admitting: Osteopathic Medicine

## 2020-08-16 ENCOUNTER — Other Ambulatory Visit (HOSPITAL_COMMUNITY)
Admission: RE | Admit: 2020-08-16 | Discharge: 2020-08-16 | Disposition: A | Discharge status: Home / Self Care | Payer: BC Managed Care – PPO | Source: Ambulatory Visit | Attending: Obstetrics and Gynecology | Admitting: Obstetrics and Gynecology

## 2020-08-16 ENCOUNTER — Other Ambulatory Visit: Payer: Self-pay

## 2020-08-16 ENCOUNTER — Encounter: Payer: Self-pay | Admitting: Obstetrics and Gynecology

## 2020-08-16 ENCOUNTER — Ambulatory Visit: Payer: BC Managed Care – PPO | Admitting: Osteopathic Medicine

## 2020-08-16 VITALS — BP 127/85 | HR 98 | Temp 98.4°F | Wt 229.0 lb

## 2020-08-16 VITALS — BP 122/79 | HR 100 | Resp 16 | Ht 64.0 in | Wt 228.0 lb

## 2020-08-16 DIAGNOSIS — G43709 Chronic migraine without aura, not intractable, without status migrainosus: Secondary | ICD-10-CM | POA: Diagnosis not present

## 2020-08-16 DIAGNOSIS — R41840 Attention and concentration deficit: Secondary | ICD-10-CM

## 2020-08-16 DIAGNOSIS — Z818 Family history of other mental and behavioral disorders: Secondary | ICD-10-CM

## 2020-08-16 DIAGNOSIS — Z01419 Encounter for gynecological examination (general) (routine) without abnormal findings: Secondary | ICD-10-CM | POA: Insufficient documentation

## 2020-08-16 DIAGNOSIS — R4189 Other symptoms and signs involving cognitive functions and awareness: Secondary | ICD-10-CM

## 2020-08-16 DIAGNOSIS — R635 Abnormal weight gain: Secondary | ICD-10-CM | POA: Insufficient documentation

## 2020-08-16 DIAGNOSIS — F411 Generalized anxiety disorder: Secondary | ICD-10-CM

## 2020-08-16 MED ORDER — AIMOVIG 70 MG/ML ~~LOC~~ SOAJ: 70.0000 mg | SUBCUTANEOUS | 3 refills | Status: DC

## 2020-08-16 NOTE — Progress Notes (Signed)
GYNECOLOGY ANNUAL PREVENTATIVE CARE ENCOUNTER NOTE  History:     Ashlee Newton is a 25 y.o. G0P0000 female here for a routine annual gynecologic exam.  Current complaints: None. Has PCP.   Denies abnormal vaginal bleeding, discharge, pelvic pain, problems with intercourse or other gynecologic concerns.    Gynecologic History No LMP recorded. (Menstrual status: IUD). Contraception: IUD Last Pap: 06/25/19. Results were: normal with negative HPV Last mammogram: NA Obstetric History OB History  Gravida Para Term Preterm AB Living  0 0 0 0 0 0  SAB IAB Ectopic Multiple Live Births  0 0 0 0 0    Past Medical History:  Diagnosis Date  . Allergies   . Allergy   . Head pain   . Migraine   . Psoriasis   . Psoriatic arthritis [facility])     Past Surgical History:  Procedure Laterality Date  . labrial repair    . TONSILLECTOMY AND ADENOIDECTOMY    . TYMPANOSTOMY TUBE PLACEMENT      Current Outpatient Medications on File Prior to Visit  Medication Sig Dispense Refill  . Azelastine-Fluticasone 137-50 MCG/ACT SUSP SPRAY 1 SPRAY INTO BOTH NOSTRILS TWICE A DAY AS DIRECTED AS NEEDED    . levocetirizine (XYZAL) 5 MG tablet Take by mouth.    . levonorgestrel (MIRENA) 20 MCG/24HR IUD 1 each by Intrauterine route once. Mirena IUD placed 06/25/2019    . montelukast (SINGULAIR) 10 MG tablet Take by mouth.    . rizatriptan (MAXALT) 10 MG tablet Take 1 tablet (10 mg total) by mouth as needed for migraine. May repeat in 2 hours if needed 10 tablet 6  . spironolactone (ALDACTONE) 50 MG tablet Take 50 mg by mouth daily.    Marland Kitchen triamcinolone cream (KENALOG) 0.1 % Apply 1 application topically 2 (two) times daily. 30 g 0  . venlafaxine XR (EFFEXOR-XR) 37.5 MG 24 hr capsule Take 2 at bedtime. 180 capsule 4  . methocarbamol (ROBAXIN) 500 MG tablet Take 1 tablet (500 mg total) by mouth every 6 (six) hours as needed for muscle spasms. (Patient not taking: Reported on 08/16/2020) 60 tablet 1   No current  facility-administered medications on file prior to visit.    Allergies  Allergen Reactions  . Contrast Media [Iodinated Diagnostic Agents] Rash    Social History:  reports that she has never smoked. She has never used smokeless tobacco. She reports previous alcohol use. She reports that she does not use drugs.  Family History  Problem Relation Age of Onset  . Migraines Mother   . Hypercholesterolemia Mother   . Hypertension Father   . Cancer Maternal Grandfather        pancreatitis --> pancreatic CA    The following portions of the patient's history were reviewed and updated as appropriate: allergies, current medications, past family history, past medical history, past social history, past surgical history and problem list.  Review of Systems Pertinent items noted in HPI and remainder of comprehensive ROS otherwise negative.  Physical Exam:  BP 122/79   Pulse 100   Resp 16   Ht 5\' 4"  (1.626 m)   Wt 228 lb (103.4 kg)   BMI 39.14 kg/m  CONSTITUTIONAL: Well-developed, well-nourished female in no acute distress.  HENT:  Normocephalic, atraumatic, External right and left ear normal.  EYES: Conjunctivae and EOM are normal. Pupils are equal, round, and reactive to light. No scleral icterus.  NECK: Normal range of motion, supple, no masses.  Normal thyroid.  SKIN: Skin  is warm and dry. No rash noted. Not diaphoretic. No erythema. No pallor. MUSCULOSKELETAL: Normal range of motion. No tenderness.  No cyanosis, clubbing, or edema. NEUROLOGIC: Alert and oriented to person, place, and time. Normal reflexes, muscle tone coordination.  PSYCHIATRIC: Normal mood and affect. Normal behavior. Normal judgment and thought content. CARDIOVASCULAR: Normal heart rate noted, regular rhythm RESPIRATORY: Clear to auscultation bilaterally. Effort and breath sounds normal, no problems with respiration noted. BREASTS: Symmetric in size. No masses, tenderness, skin changes, nipple drainage, or  lymphadenopathy bilaterally. Performed in the presence of a chaperone. ABDOMEN: Soft, no distention noted.  No tenderness, rebound or guarding.  PELVIC: Normal appearing external genitalia and urethral meatus; normal appearing vaginal mucosa and cervix.  No abnormal discharge noted.  Pap smear obtained.  Normal uterine size, no other palpable masses, no uterine or adnexal tenderness.  Performed in the presence of a chaperone.   Assessment and Plan:   1. Well woman exam  - Pap not due today.  - Return in 1 year for annual - Cervicovaginal ancillary only( [facility])   Routine preventative health maintenance measures emphasized. Please refer to After Visit Summary for other counseling recommendations.      Biochemist, clinical for [employer], [facility]

## 2020-08-16 NOTE — Patient Instructions (Signed)
I'll reach out to neurology Plan:  Come off Effexor May consider alternative migraine prevention (injetions?)  May start Wellbutrin for appetite suppression and attention  Will refer for ADHD evaluation - you should get a call about setting this up   I'll reach out to you via MyChart once I've chatted w/ neurology about the above!

## 2020-08-16 NOTE — Addendum Note (Signed)
Addended by: Deirdre Pippins on: 08/16/2020 04:57 PM   Modules accepted: Orders

## 2020-08-16 NOTE — Progress Notes (Signed)
Ashlee Newton is a 25 y.o. female who presents to  [facility]  today, 08/16/20, seeking care for the following:  . Weight increase - Min 180 lb in 2017 --> Max 239 lb 03/2020 --> 229 lb now. Pt notes concern for effexor causing increased appetitie, though it is helping migraines. Topamax was helping w/ appetite suppression which was encouraging, but pt reports headaches were no better or even worse on this medicine.  . Concentration problems: a one point was recommended she be tested for ADHD but her mom declined to pursue testing d/t pt doing ok in school. Her brother has poorly controlled ADHD, formally diagnosed. Ashlee Newton reports difficulty focusing even w/ minimizing distractions, constantly losing items, can't follow conversations,  . Migraines: Plan from neuro based on 05/2020 notes --> D/c Topamax d/t no benefit and possibly made HA worse; Started Effexor to work up to 75 mg qhs; Continue Maxalt prn. Pt has follow up next month. Hx abnormally slowed EEG. MRI ok.     ASSESSMENT & PLAN with other pertinent findings:  The primary encounter diagnosis was Chronic migraine w/o aura w/o status migrainosus, not intractable. Diagnoses of Abnormal weight gain, Concentration deficit, and Family history of attention deficit hyperactivity disorder (ADHD) were also pertinent to this visit.   Will route note to neurology - if ok with them I think we might consider d/c Effexor with slow taper off, start CGRP Rx instead for ppx (having tried/failed topiramate and venlafaxine I think would qualify for this through insurance). If doing ok with this switch, I think we can start Wellbutrin non-stimulant to see if this helps concentration issues as well as helps w/ appetite suppress/weight. I've referred for formal ADHD evaluation. Would also like neurology input pending results of ADHD evaluation.     Patient Instructions  I'll reach out to  neurology Plan:  Come off Effexor May consider alternative migraine prevention (injetions?)  May start Wellbutrin for appetite suppression and attention  Will refer for ADHD evaluation - you should get a call about setting this up   I'll reach out to you via MyChart once I've chatted w/ neurology about the above!       No orders of the defined types were placed in this encounter.   No orders of the defined types were placed in this encounter.    See below for relevant physical exam findings  See below for recent lab and imaging results reviewed  Medications, allergies, PMH, PSH, SocH, FamH reviewed below    Follow-up instructions: Return in about 6 weeks (around 09/27/2020) for RECHECK ON NEW MEDICATION.                                        Exam:  BP 127/85 (BP Location: Left Arm, Patient Position: Sitting, Cuff Size: Large)   Pulse 98   Temp 98.4 F (36.9 C) (Oral)   Wt 229 lb 0.6 oz (103.9 kg)   BMI 39.31 kg/m   Constitutional: VS see above. General Appearance: alert, well-developed, well-nourished, NAD  Neck: No masses, trachea midline.   Respiratory: Normal respiratory effort.   Musculoskeletal: Gait normal. Symmetric and independent movement of all extremities  Neurological: Normal balance/coordination. No tremor.  Skin: warm, dry, intact.   Psychiatric: Normal judgment/insight. Normal mood and affect. Oriented x3.   Current Meds  Medication Sig  . Azelastine-Fluticasone  137-50 MCG/ACT SUSP SPRAY 1 SPRAY INTO BOTH NOSTRILS TWICE A DAY AS DIRECTED AS NEEDED  . levocetirizine (XYZAL) 5 MG tablet Take by mouth.  . levonorgestrel (MIRENA) 20 MCG/24HR IUD 1 each by Intrauterine route once. Mirena IUD placed 06/25/2019  . montelukast (SINGULAIR) 10 MG tablet Take by mouth.  . rizatriptan (MAXALT) 10 MG tablet Take 1 tablet (10 mg total) by mouth as needed for migraine. May repeat in 2 hours if needed  . spironolactone  (ALDACTONE) 50 MG tablet Take 50 mg by mouth daily.  Marland Kitchen triamcinolone cream (KENALOG) 0.1 % Apply 1 application topically 2 (two) times daily.  Marland Kitchen venlafaxine XR (EFFEXOR-XR) 37.5 MG 24 hr capsule Take 2 at bedtime.    Allergies  Allergen Reactions  . Contrast Media [Iodinated Diagnostic Agents] Rash    Patient Active Problem List   Diagnosis Date Noted  . Abnormal weight gain 08/16/2020  . Concentration deficit 08/16/2020  . Family history of attention deficit hyperactivity disorder (ADHD) 08/16/2020  . Memory loss 04/18/2020  . Involuntary movements 04/18/2020  . 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 Status post labral repair of shoulder 10/28/2019  . Glenoid labral tear, right, subsequent encounter 09/29/2019  . Acute pain of right shoulder 12/22/2018  . 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 Cervical radiculitis 12/29/2015  . Lumbar radiculopathy 12/29/2015  . Seasonal allergies 07/04/2011  . Irregular menses 07/04/2011  . Headache(784.0) 07/04/2011    Family History  Problem Relation Age of Onset  . Migraines Mother   . Hypercholesterolemia Mother   . Hypertension Father   . Cancer Maternal Grandfather        pancreatitis --> pancreatic CA    Social History   Tobacco Use  Smoking Status Never Smoker  Smokeless Tobacco Never Used    Past Surgical History:  Procedure Laterality Date  . labrial repair    . TONSILLECTOMY AND ADENOIDECTOMY    . TYMPANOSTOMY TUBE PLACEMENT      Immunization History  Administered Date(s) Administered  . HPV 9-valent 11/25/2018  . HPV Quadrivalent 05/22/2018, 07/24/2018  . Meningococcal Conjugate 10/28/2013  . Tdap 10/28/2013    No results found for this or any previous visit (from the past 2160 hour(s)).  No results found.     All questions at time of visit were answered - patient instructed to contact office with any additional concerns or updates. ER/RTC  precautions were reviewed with the patient as applicable.   Please note: manual typing as well as voice recognition software may have been used to produce this document - typos may escape review. Please contact Dr. Lyn Hollingshead for any needed clarifications.

## 2020-08-17 ENCOUNTER — Telehealth: Payer: Self-pay

## 2020-08-17 LAB — CERVICOVAGINAL ANCILLARY ONLY
Bacterial Vaginitis (gardnerella): NEGATIVE
Candida Glabrata: NEGATIVE
Candida Vaginitis: POSITIVE — AB
Chlamydia: NEGATIVE
Comment: NEGATIVE
Comment: NEGATIVE
Comment: NEGATIVE
Comment: NEGATIVE
Comment: NEGATIVE
Comment: NORMAL
Neisseria Gonorrhea: NEGATIVE
Trichomonas: NEGATIVE

## 2020-08-17 NOTE — Telephone Encounter (Signed)
Notification through Covermymeds that a PA was needed for patient's [employer]. Attempted PA but received the following message:  [employer] does not handle this review. Please visit [email or web address] to start a prior authorization or fax information to [phone]. Please include all supporting chart notes. You may contact [employer] at [phone].  PA completed using the website indicated. PA ID# [id number]. Last OV note also faxed.

## 2020-08-18 ENCOUNTER — Other Ambulatory Visit: Payer: Self-pay | Admitting: Obstetrics and Gynecology

## 2020-08-18 MED ORDER — FLUCONAZOLE 150 MG PO TABS: 150.0000 mg | ORAL_TABLET | Freq: Every day | ORAL | 0 refills | Status: DC

## 2020-08-18 NOTE — Progress Notes (Signed)
Diflucan sent to pharmacy.

## 2020-08-24 NOTE — Telephone Encounter (Signed)
PA approval obtained. Approval faxed to pharmacy and fax confirmation receipt received.

## 2020-08-29 DIAGNOSIS — J3081 Allergic rhinitis due to animal (cat) (dog) hair and dander: Secondary | ICD-10-CM | POA: Diagnosis not present

## 2020-09-11 ENCOUNTER — Ambulatory Visit: Payer: BC Managed Care – PPO | Admitting: Neurology

## 2020-09-11 ENCOUNTER — Encounter: Payer: Self-pay | Admitting: Neurology

## 2020-09-11 VITALS — BP 112/78 | HR 84 | Ht 64.0 in | Wt 226.0 lb

## 2020-09-11 DIAGNOSIS — R413 Other amnesia: Secondary | ICD-10-CM

## 2020-09-11 DIAGNOSIS — G43709 Chronic migraine without aura, not intractable, without status migrainosus: Secondary | ICD-10-CM | POA: Diagnosis not present

## 2020-09-11 MED ORDER — NURTEC 75 MG PO TBDP: 75.0000 mg | ORAL_TABLET | ORAL | 11 refills | Status: DC | PRN

## 2020-09-11 NOTE — Progress Notes (Signed)
PATIENT: Ashlee Newton DOB: 07-Oct-1995  REASON FOR VISIT: follow up HISTORY FROM: patient  HISTORY OF PRESENT ILLNESS: Today 09/11/20  HISTORY Ashlee Newton is a 25 year old female, seen in request by her primary care physician Dr. Sunnie Nielsen for evaluation of migraine headache, initial evaluation was on April 18, 2020.  I reviewed and summarized the referring note.  Past medical history Asthma.  She had long history of migraine headache, become daily since 2018, she describes her headache as bilateral temporal region mild to moderate pressure, sometimes settled at one site pounding, with occasionally noise smell sensitivity, no significant light sensitivity, movement made it worse, taking a nap always helpful  She has tried over-the-counter medications without significant help, she is not taking medicine regularly, but complains of mild daily headaches, she works 2 jobs, also Gaffer on attending [employer],  She also complains of mild memory loss, forgot glasses on the top of her head, word searching the middle of the sentence,  I personally reviewed MRI of the brain without contrast February 07, 2020 that was normal  Laboratory evaluation in 2021: Negative hepatitis A,B,C, negative HIV,  Update June 14, 2020 SS: here today alone, taking Topamax 100 mg twice daily, no better with medications, migraines can't remember last day without one for several months, on good days localized frontal area, bad days entire head, some mornings feels hit with baseball bat occipital area. No migraine features, no nausea, not sensitive to light, her mom had migraines with typical symptoms, mom did great Topamax. Feels Topamax has made her worst, her headaches gotten worse, no side effects from Topamax per see. 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 Works 40 hours a week, in school at [employer] full time.  Still has spells of in middle of sentence gets  distracted very easy, her brother had ADHD, she has never been tested. Is forgetful, mind goes blank. Didn't start happening until headaches became more frequent in 2018. Trouble falling asleep at times, in general sleeps well. Last few mornings waking up with double vision, sees eye doctor, fluid build up optic nerve, observation, got reading glasses. Reviewed sleep deprived EEG with Dr. Terrace Arabia, no significant abnormalities, no seizures noted, but artifact present.   Update September 11, 2020 SS: Saw PCP back in March 2022, reported weight gain with Effexor, was helpful for headache.  Is currently weaning down dose.  Started on Aimovig 70 mg monthly injection, has been helpful for migraines, so far tolerating well.  On average reports 3 migraines a week.  Claims starts behind the left eye, then becomes generalized. "Nothing help", including Maxalt, Imitrex, Tylenol, ibuprofen. Continues to work, full-time in Arts administrator in the ER, in nursing school.  Scheduled for testing for ADD in May.  Claims told back in 2019 or 2020 by ophthalmology she had fluid buildup to her optic nerve, has not been followed up on.  REVIEW OF SYSTEMS: Out of a complete 14 system review of symptoms, the patient complains only of the following symptoms, and all other reviewed systems are negative.  Headache   ALLERGIES: Allergies  Allergen Reactions  . Contrast Media [Iodinated Diagnostic Agents] Rash    HOME MEDICATIONS: Outpatient Medications Prior to Visit  Medication Sig Dispense Refill  . Azelastine-Fluticasone 137-50 MCG/ACT SUSP SPRAY 1 SPRAY INTO BOTH NOSTRILS TWICE A DAY AS DIRECTED AS NEEDED    . Erenumab-aooe (AIMOVIG) 70 MG/ML SOAJ Inject 70 mg into the skin every 30 (thirty)  days. 1.12 mL 3  . fluconazole (DIFLUCAN) 150 MG tablet Take 1 tablet (150 mg total) by mouth daily. Take one dose today and repeat in 3 days. 2 tablet 0  . HUMIRA PEN 40 MG/0.4ML PNKT SMARTSIG:40 Milligram(s) SUB-Q Every 2 Weeks    .  levocetirizine (XYZAL) 5 MG tablet Take by mouth.    . levonorgestrel (MIRENA) 20 MCG/24HR IUD 1 each by Intrauterine route once. Mirena IUD placed 06/25/2019    . montelukast (SINGULAIR) 10 MG tablet Take by mouth.    . spironolactone (ALDACTONE) 50 MG tablet Take 50 mg by mouth daily.    Marland Kitchen triamcinolone cream (KENALOG) 0.1 % Apply 1 application topically 2 (two) times daily. 30 g 0  . rizatriptan (MAXALT) 10 MG tablet Take 1 tablet (10 mg total) by mouth as needed for migraine. May repeat in 2 hours if needed 10 tablet 6  . venlafaxine XR (EFFEXOR-XR) 37.5 MG 24 hr capsule Take 2 at bedtime. 180 capsule 4   No facility-administered medications prior to visit.    PAST MEDICAL HISTORY: Past Medical History:  Diagnosis Date  . Allergies   . Allergy   . Head pain   . Migraine   . Psoriasis   . Psoriatic arthritis (HCC)     PAST SURGICAL HISTORY: Past Surgical History:  Procedure Laterality Date  . labrial repair    . TONSILLECTOMY AND ADENOIDECTOMY    . TYMPANOSTOMY TUBE PLACEMENT      FAMILY HISTORY: Family History  Problem Relation Age of Onset  . Migraines Mother   . Hypercholesterolemia Mother   . Hypertension Father   . Cancer Maternal Grandfather        pancreatitis --> pancreatic CA    SOCIAL HISTORY: Social History   Socioeconomic History  . Marital status: Single    Spouse name: Not on file  . Number of children: 0  . Years of education: college  . Highest education level: Bachelor's degree (e.g., BA, AB, BS)  Occupational History  . Occupation: Museum/gallery conservator  . Occupation: registration for [employer]  Tobacco Use  . Smoking status: Never Smoker  . Smokeless tobacco: Never Used  Vaping Use  . Vaping Use: Never used  Substance and Sexual Activity  . Alcohol use: Not Currently  . Drug use: No  . Sexual activity: Yes    Partners: Male    Birth control/protection: I.U.D.  Other Topics Concern  . Not on file  Social History Narrative   Lives with her sister.    Right-handed.   Caffeine use: 2-3 cups per day.   Social Determinants of Health   Financial Resource Strain: Not on file  Food Insecurity: Not on file  Transportation Needs: Not on file  Physical Activity: Not on file  Stress: Not on file  Social Connections: Not on file  Intimate Partner Violence: Not on file   PHYSICAL EXAM  Vitals:   09/11/20 1246  BP: 112/78  Pulse: 84  Weight: 226 lb (102.5 kg)  Height: 5\' 4"  (1.626 m)   Body mass index is 38.79 kg/m.  Generalized: Well developed, in no acute distress   Neurological examination  Mentation: Alert oriented to time, place, history taking. Follows all commands speech and language fluent Cranial nerve II-XII: Pupils were equal round reactive to light. Extraocular movements were full, visual field were full on confrontational test. Facial sensation and strength were normal.  Head turning and shoulder shrug  were normal and symmetric. Motor: The motor testing reveals 5  over 5 strength of all 4 extremities. Good symmetric motor tone is noted throughout.  Sensory: Sensory testing is intact to soft touch on all 4 extremities. No evidence of extinction is noted.  Coordination: Cerebellar testing reveals good finger-nose-finger and heel-to-shin bilaterally.  Gait and station: Gait is normal. Reflexes: Deep tendon reflexes are symmetric and normal bilaterally.   DIAGNOSTIC DATA (LABS, IMAGING, TESTING) - I reviewed patient records, labs, notes, testing and imaging myself where available.  Lab Results  Component Value Date   WBC 5.8 04/18/2020   HGB 13.6 04/18/2020   HCT 39.9 04/18/2020   MCV 85 04/18/2020   PLT 361 04/18/2020      Component Value Date/Time   NA 139 04/18/2020 1106   K 4.6 04/18/2020 1106   CL 102 04/18/2020 1106   CO2 25 04/18/2020 1106   GLUCOSE 86 04/18/2020 1106   GLUCOSE 74 12/07/2012 1702   BUN 9 04/18/2020 1106   CREATININE 0.85 04/18/2020 1106   CREATININE 0.81 12/07/2012 1702   CALCIUM 9.5  04/18/2020 1106   PROT 6.9 04/18/2020 1106   ALBUMIN 4.2 04/18/2020 1106   AST 19 04/18/2020 1106   ALT 24 04/18/2020 1106   ALKPHOS 85 04/18/2020 1106   BILITOT 0.3 04/18/2020 1106   GFRNONAA 96 04/18/2020 1106   GFRAA 111 04/18/2020 1106   No results found for: CHOL, HDL, LDLCALC, LDLDIRECT, TRIG, CHOLHDL No results found for: VQQV9D Lab Results  Component Value Date   VITAMINB12 453 04/18/2020   Lab Results  Component Value Date   TSH 1.460 04/18/2020   ASSESSMENT AND PLAN 25 y.o. year old female  has a past medical history of Allergies, Allergy, Head pain, Migraine, Psoriasis, and Psoriatic arthritis (HCC). here with:  1.  Chronic migraine headaches 2.  Memory loss -MRI of the brain was normal -EEG showed mild to moderate background slowing, more than expected for a healthy 25 year old, sleep deprived EEG no significant abnormalities, no seizures, but artifact present, coordinate with [facility]on Brass, time to come hook up to EEG to repeat -Laboratory evaluation (B12, RPR, CBC, CMP, TSH) were normal -Felt Topamax worsened her headaches, reported weight gain with Effexor -Reports no benefit with Imitrex, Maxalt, Tylenol, ibuprofen -Will try Nurtec for acute headache -PCP started Aimovig, first injection 70 mg was 08/28/20, encouraged to continue this, give up to 3 to 4 months to see full benefit -Referral to ophthalmology, needs annual eye evaluation, reports history of being told fluid around optic nerve? -Having ADD testing in May  -Follow-up in 6 months or sooner if needed  I spent 30 minutes of face-to-face and non-face-to-face time with patient.  This included previsit chart review, lab review, study review, order entry, electronic health record documentation, patient education.  Margie Ege, AGNP-C, DNP 09/11/2020, 1:13 PM [facility] [address], [address] [place], [place] [address] [phone]

## 2020-09-11 NOTE — Patient Instructions (Signed)
Continue Aimovig for migraine prevention  Try Nurtec for acute headache, take 1 tablet at headache onset, max is 1 tablet in 24 hours Referral to eye doctor for annual evaluation  Get you set up to come back for EEG See you back in 6 months

## 2020-09-12 ENCOUNTER — Telehealth: Payer: Self-pay | Admitting: *Deleted

## 2020-09-12 NOTE — Telephone Encounter (Signed)
PA started for Nurtec on [employer] (PA/EOC# [id number]). Pt ZC#[id number]. Decision pending.

## 2020-09-13 NOTE — Telephone Encounter (Addendum)
She has pharmacy coverage through [employer]. PA dept [id number] (Mon - Fri, 8am - 6pm CST).  Her insurance plan denied Nurtec for acute migraine treatment. Per her plan, it can only be approved if the patient is currently being treated with amitriptyline, venlafaxine, divalproex sodium, topiramate, or Candesartan or has a contraindication or intolerance to these therapies (must be documented in her medical records).  She has only tried topiramate (worsening headaches) and venlafaxine (weight gain) in the past for prevention. She is currently using Aimovig 70mg  monthly.  She has failed sumatriptan, rizatriptan and NSAIDS in the past for rescue.  Per VO by , NP, we can try Ubrelvy 100mg , #10/30. If it gets denied, then we can try eletriptan 40mg , #10/30.  PA pending for Ubrelvy. Started on [employer]PA ([phone]). Decision pending.

## 2020-09-14 MED ORDER — UBRELVY 100 MG PO TABS: ORAL_TABLET | ORAL | 5 refills | Status: DC

## 2020-09-14 NOTE — Addendum Note (Signed)
Addended by: Lindell Spar C on: 09/14/2020 10:14 AM   Modules accepted: Orders

## 2020-09-14 NOTE — Telephone Encounter (Addendum)
Received approval from [employer] for Ubrelvy 100mg , #10/30. PA approved through 12/13/2020. The patient is aware and agreeable to this therapy change. New prescription sent to the pharmacy.

## 2020-09-27 ENCOUNTER — Telehealth: Payer: Self-pay | Admitting: Neurology

## 2020-09-27 ENCOUNTER — Encounter: Payer: Self-pay | Admitting: Osteopathic Medicine

## 2020-09-27 ENCOUNTER — Other Ambulatory Visit: Payer: Self-pay

## 2020-09-27 ENCOUNTER — Ambulatory Visit: Payer: BC Managed Care – PPO | Admitting: Osteopathic Medicine

## 2020-09-27 VITALS — BP 121/72 | HR 75 | Temp 98.3°F | Wt 233.0 lb

## 2020-09-27 DIAGNOSIS — F411 Generalized anxiety disorder: Secondary | ICD-10-CM

## 2020-09-27 DIAGNOSIS — G43709 Chronic migraine without aura, not intractable, without status migrainosus: Secondary | ICD-10-CM | POA: Diagnosis not present

## 2020-09-27 DIAGNOSIS — G43109 Migraine with aura, not intractable, without status migrainosus: Secondary | ICD-10-CM | POA: Diagnosis not present

## 2020-09-27 DIAGNOSIS — R41840 Attention and concentration deficit: Secondary | ICD-10-CM

## 2020-09-27 NOTE — Telephone Encounter (Signed)
Received a note from Dr. Dione Booze, slight blurry disc margins both eyes, but no significant elevation.  There is spontaneous venous pulsations which rule out papilledema. Follow-up with Dr. Dione Booze PRN.

## 2020-09-27 NOTE — Progress Notes (Signed)
Ashlee Newton is a 25 y.o. female who presents to  [facility] at [facility]  today, 09/27/20, seeking care for the following:  Follow up   Migraines: following w/ neurology, has also been referred to ophtho. Planning ot repeat EEG, was slower than expected for age. OK to continue Aimovig, give it 6 oms to see if helps. Trial Nurtec/Ubrelvy for pen use -   Mental health / ADHD testing: referral to [place] and eval scehduled for this month      ASSESSMENT & PLAN with other pertinent findings:  The primary encounter diagnosis was Anxiety state. Diagnoses of Concentration deficit and Chronic migraine w/o aura w/o status migrainosus, not intractable were also pertinent to this visit.   Patient Instructions  Plan:  Will let you know once i've gotten the report from ADD evaluation. Call me if you haven't heard form me a week after your appointment with them. Based on that, will discuss Rx and follow-up based on whatever we decide at that point.   Send me the school form, sounds like I should be able to complete this for you no problem.    No orders of the defined types were placed in this encounter.   No orders of the defined types were placed in this encounter.    See below for relevant physical exam findings  See below for recent lab and imaging results reviewed  Medications, allergies, PMH, PSH, SocH, FamH reviewed below    Follow-up instructions: Return for pending ADHD evaluatoin .                                        Exam:  BP 121/72 (BP Location: Left Arm, Patient Position: Sitting, Cuff Size: Normal)   Pulse 75   Temp 98.3 F (36.8 C) (Oral)   Wt 233 lb (105.7 kg)   BMI 39.99 kg/m   Constitutional: VS see above. General Appearance: alert, well-developed, well-nourished, NAD  Neck: No masses, trachea midline.   Respiratory: Normal respiratory effort.   Musculoskeletal: Gait  normal. Symmetric and independent movement of all extremities  Neurological: Normal balance/coordination. No tremor.  Skin: warm, dry, intact.   Psychiatric: Normal judgment/insight. Normal mood and affect. Oriented x3.   Current Meds  Medication Sig  . Azelastine-Fluticasone 137-50 MCG/ACT SUSP SPRAY 1 SPRAY INTO BOTH NOSTRILS TWICE A DAY AS DIRECTED AS NEEDED  . Erenumab-aooe (AIMOVIG) 70 MG/ML SOAJ Inject 70 mg into the skin every 30 (thirty) days.  Marland Kitchen HUMIRA PEN 40 MG/0.4ML PNKT SMARTSIG:40 Milligram(s) SUB-Q Every 2 Weeks  . levocetirizine (XYZAL) 5 MG tablet Take by mouth.  . levonorgestrel (MIRENA) 20 MCG/24HR IUD 1 each by Intrauterine route once. Mirena IUD placed 06/25/2019  . montelukast (SINGULAIR) 10 MG tablet Take by mouth.  . spironolactone (ALDACTONE) 50 MG tablet Take 50 mg by mouth daily.  Marland Kitchen triamcinolone cream (KENALOG) 0.1 % Apply 1 application topically 2 (two) times daily.  Marland Kitchen Ubrogepant (UBRELVY) 100 MG TABS Take 1 tab at onset of migraine. May repeat in 2 hrs, if needed. Max dose: 2 tabs/day or 10 tabs/30 days.    Allergies  Allergen Reactions  . Contrast Media [Iodinated Diagnostic Agents] Rash    Patient Active Problem List   Diagnosis Date Noted  . Abnormal weight gain 08/16/2020  . Concentration deficit 08/16/2020  . Family history of attention deficit hyperactivity disorder (ADHD)  08/16/2020  . Memory loss 04/18/2020  . Involuntary movements 04/18/2020  . 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 Status post labral repair of shoulder 10/28/2019  . Glenoid labral tear, right, subsequent encounter 09/29/2019  . Acute pain of right shoulder 12/22/2018  . 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 Cervical radiculitis 12/29/2015  . Lumbar radiculopathy 12/29/2015  . Seasonal allergies 07/04/2011  . Irregular menses 07/04/2011  . Headache(784.0) 07/04/2011    Family History  Problem Relation Age  of Onset  . Migraines Mother   . Hypercholesterolemia Mother   . Hypertension Father   . Cancer Maternal Grandfather        pancreatitis --> pancreatic CA    Social History   Tobacco Use  Smoking Status Never Smoker  Smokeless Tobacco Never Used    Past Surgical History:  Procedure Laterality Date  . labrial repair    . TONSILLECTOMY AND ADENOIDECTOMY    . TYMPANOSTOMY TUBE PLACEMENT      Immunization History  Administered Date(s) Administered  . HPV 9-valent 11/25/2018  . HPV Quadrivalent 05/22/2018, 07/24/2018  . Meningococcal Conjugate 10/28/2013  . Tdap 10/28/2013    Recent Results (from the past 2160 hour(s))  Cervicovaginal ancillary only( [place])     Status: Abnormal   Collection Time: 08/16/20  8:57 AM  Result Value Ref Range   Neisseria Gonorrhea Negative    Chlamydia Negative    Trichomonas Negative    Bacterial Vaginitis (gardnerella) Negative    Candida Vaginitis Positive (A)    Candida Glabrata Negative    Comment Normal Reference Range Candida Species - Negative    Comment Normal Reference Range Candida Galbrata - Negative    Comment Normal Reference Range Trichomonas - Negative    Comment Normal Reference Ranger Chlamydia - Negative    Comment      Normal Reference Range Neisseria Gonorrhea - Negative   Comment      Normal Reference Range Bacterial Vaginosis - Negative    No results found.     All questions at time of visit were answered - patient instructed to contact office with any additional concerns or updates. ER/RTC precautions were reviewed with the patient as applicable.   Please note: manual typing as well as voice recognition software may have been used to produce this document - typos may escape review. Please contact Dr. Lyn Hollingshead for any needed clarifications.

## 2020-09-27 NOTE — Patient Instructions (Signed)
Plan:  Will let you know once i've gotten the report from ADD evaluation. Call me if you haven't heard form me a week after your appointment with them. Based on that, will discuss Rx and follow-up based on whatever we decide at that point.   Send me the school form, sounds like I should be able to complete this for you no problem.

## 2020-09-28 ENCOUNTER — Encounter: Payer: Self-pay | Admitting: Osteopathic Medicine

## 2020-10-05 ENCOUNTER — Ambulatory Visit: Payer: BC Managed Care – PPO | Admitting: Psychology

## 2020-10-05 DIAGNOSIS — F89 Unspecified disorder of psychological development: Secondary | ICD-10-CM | POA: Diagnosis not present

## 2020-10-06 ENCOUNTER — Telehealth: Payer: Self-pay | Admitting: Osteopathic Medicine

## 2020-10-06 NOTE — Telephone Encounter (Signed)
Forms submitted by patient  Needs appt for TB test Need to confirm Hep B dates I can't read from the photo sent  Needs Varicella vaccination or blood draw for titers - ok to put order in for immunization or for lab titer per patient preference   Forms and note in Vanicia's inbasket!

## 2020-10-06 NOTE — Telephone Encounter (Addendum)
Task completed. Left a detailed vm msg for pt regarding provider's note. Pt informed to return a call back with immunization information and to schedule a NV for PPD placement. Direct call back info provided. Forms placed in medical assistant's box.

## 2020-10-09 ENCOUNTER — Other Ambulatory Visit: Payer: Self-pay

## 2020-10-09 ENCOUNTER — Other Ambulatory Visit (HOSPITAL_BASED_OUTPATIENT_CLINIC_OR_DEPARTMENT_OTHER): Payer: Self-pay

## 2020-10-09 ENCOUNTER — Ambulatory Visit: Payer: BC Managed Care – PPO | Attending: Internal Medicine

## 2020-10-09 DIAGNOSIS — Z23 Encounter for immunization: Secondary | ICD-10-CM

## 2020-10-09 MED ORDER — PFIZER-BIONT COVID-19 VAC-TRIS 30 MCG/0.3ML IM SUSP
INTRAMUSCULAR | 0 refills | Status: DC
  Filled 2020-10-09: qty 0.3, 1d supply, fill #0

## 2020-10-09 NOTE — Progress Notes (Signed)
   Covid-19 Vaccination Clinic  Name:  Ashlee Newton    MRN: [id number] DOB: [birth year]  10/09/2020  [name] was observed post Covid-19 immunization for 15 minutes without incident. She was provided with Vaccine Information Sheet and instruction to access the V-Safe system.   [name] was instructed to call 911 with any severe reactions post vaccine: Marland Kitchen Difficulty breathing  . Swelling of face and throat  . A fast heartbeat  . A bad rash all over body  . Dizziness and weakness

## 2020-10-25 ENCOUNTER — Ambulatory Visit: Payer: BC Managed Care – PPO | Admitting: Psychology

## 2020-10-29 ENCOUNTER — Other Ambulatory Visit: Payer: Self-pay | Admitting: Osteopathic Medicine

## 2020-11-09 ENCOUNTER — Encounter: Payer: Self-pay | Admitting: Osteopathic Medicine

## 2020-11-09 ENCOUNTER — Ambulatory Visit (INDEPENDENT_AMBULATORY_CARE_PROVIDER_SITE_OTHER): Payer: BC Managed Care – PPO | Admitting: Psychology

## 2020-11-09 DIAGNOSIS — F411 Generalized anxiety disorder: Secondary | ICD-10-CM | POA: Diagnosis not present

## 2020-11-09 DIAGNOSIS — F902 Attention-deficit hyperactivity disorder, combined type: Secondary | ICD-10-CM | POA: Diagnosis not present

## 2020-11-09 DIAGNOSIS — F332 Major depressive disorder, recurrent severe without psychotic features: Secondary | ICD-10-CM | POA: Diagnosis not present

## 2020-11-22 ENCOUNTER — Encounter: Payer: Self-pay | Admitting: Osteopathic Medicine

## 2020-11-22 DIAGNOSIS — M2550 Pain in unspecified joint: Secondary | ICD-10-CM | POA: Diagnosis not present

## 2020-11-22 DIAGNOSIS — L409 Psoriasis, unspecified: Secondary | ICD-10-CM | POA: Diagnosis not present

## 2020-11-22 DIAGNOSIS — L4059 Other psoriatic arthropathy: Secondary | ICD-10-CM | POA: Diagnosis not present

## 2020-12-06 ENCOUNTER — Other Ambulatory Visit: Payer: Self-pay | Admitting: Osteopathic Medicine

## 2020-12-15 ENCOUNTER — Encounter: Payer: Self-pay | Admitting: Physician Assistant

## 2020-12-15 DIAGNOSIS — F902 Attention-deficit hyperactivity disorder, combined type: Secondary | ICD-10-CM

## 2020-12-15 DIAGNOSIS — F411 Generalized anxiety disorder: Secondary | ICD-10-CM | POA: Insufficient documentation

## 2020-12-15 DIAGNOSIS — F332 Major depressive disorder, recurrent severe without psychotic features: Secondary | ICD-10-CM | POA: Insufficient documentation

## 2020-12-15 HISTORY — DX: Generalized anxiety disorder: F41.1

## 2020-12-15 HISTORY — DX: Major depressive disorder, recurrent severe without psychotic features: F33.2

## 2020-12-15 HISTORY — DX: Attention-deficit hyperactivity disorder, combined type: F90.2

## 2020-12-18 DIAGNOSIS — J3081 Allergic rhinitis due to animal (cat) (dog) hair and dander: Secondary | ICD-10-CM | POA: Diagnosis not present

## 2020-12-19 ENCOUNTER — Ambulatory Visit: Payer: BC Managed Care – PPO | Admitting: Osteopathic Medicine

## 2020-12-27 ENCOUNTER — Ambulatory Visit (INDEPENDENT_AMBULATORY_CARE_PROVIDER_SITE_OTHER): Payer: BC Managed Care – PPO | Admitting: Osteopathic Medicine

## 2020-12-27 ENCOUNTER — Encounter: Payer: Self-pay | Admitting: Osteopathic Medicine

## 2020-12-27 ENCOUNTER — Telehealth: Payer: Self-pay

## 2020-12-27 VITALS — BP 119/67 | HR 83 | Temp 99.1°F | Wt 238.0 lb

## 2020-12-27 DIAGNOSIS — F909 Attention-deficit hyperactivity disorder, unspecified type: Secondary | ICD-10-CM | POA: Diagnosis not present

## 2020-12-27 DIAGNOSIS — R632 Polyphagia: Secondary | ICD-10-CM | POA: Diagnosis not present

## 2020-12-27 MED ORDER — LISDEXAMFETAMINE DIMESYLATE 20 MG PO CAPS: 20.0000 mg | ORAL_CAPSULE | Freq: Every day | ORAL | 0 refills | Status: DC

## 2020-12-27 MED ORDER — AMPHETAMINE-DEXTROAMPHET ER 10 MG PO CP24: 10.0000 mg | ORAL_CAPSULE | Freq: Every day | ORAL | 0 refills | Status: DC

## 2020-12-27 NOTE — Telephone Encounter (Signed)
Patient called stating that vyvanse rx is too expensive. Pt did not purchase the vyvanse rx. Requesting if provider can send in adderall rx to the pharmacy. Pls advise, thanks.

## 2020-12-27 NOTE — Telephone Encounter (Signed)
Done

## 2020-12-27 NOTE — Progress Notes (Signed)
Ashlee Newton is a 25 y.o. female who presents to  [facility] at [facility]  today, 12/27/20, seeking care for the following:  ADHD, discuss medication treatment (see media for records from psychology testing)      ASSESSMENT & PLAN with other pertinent findings:  The primary encounter diagnosis was Attention deficit hyperactivity disorder (ADHD), unspecified ADHD type, moderate - starting stimulant Rx 12/2020. A diagnosis of Binge eating was also pertinent to this visit.   See below Ended up d/c Vyvyanse d/t cose and sent Adderall XR 10 mg daily and will check in in a couple weeks   Patient Instructions  Will start w/ Vyvanse (If not covered may need to switch to Adderall first) Will check in in 2 weeks here in office  Will send notes to neurology - they should reach out to discuss injections / headaches   Insomnia  OK to take Melatonin 2-10 mg bedtime/dinnertime OK to take Benadryl 50 mg bedtime    No orders of the defined types were placed in this encounter.   Meds ordered this encounter  Medications   DISCONTD: lisdexamfetamine (VYVANSE) 20 MG capsule    Sig: Take 1 capsule (20 mg total) by mouth daily.    Dispense:  30 capsule    Refill:  0     See below for relevant physical exam findings  See below for recent lab and imaging results reviewed  Medications, allergies, PMH, PSH, SocH, FamH reviewed below    Follow-up instructions: Return in about 2 weeks (around 01/10/2021) for OFFICE VISIT - BP CHECK AND CHECK UP ON ADHD RX .                                        Exam:  BP 119/67 (BP Location: Left Arm, Patient Position: Sitting, Cuff Size: Large)   Pulse 83   Temp 99.1 F (37.3 C) (Oral)   Wt 238 lb 0.6 oz (108 kg)   BMI 40.86 kg/m  Constitutional: VS see above. General Appearance: alert, well-developed, well-nourished, NAD Neck: No masses, trachea midline.  Respiratory:  Normal respiratory effort. no wheeze, no rhonchi, no rales Cardiovascular: S1/S2 normal, no murmur, no rub/gallop auscultated. RRR.  Musculoskeletal: Gait normal. Symmetric and independent movement of all extremities Neurological: Normal balance/coordination. No tremor. Skin: warm, dry, intact.  Psychiatric: Normal judgment/insight. Normal mood and affect. Oriented x3.   Current Meds  Medication Sig   Azelastine-Fluticasone 137-50 MCG/ACT SUSP SPRAY 1 SPRAY INTO BOTH NOSTRILS TWICE A DAY AS DIRECTED AS NEEDED   COVID-19 mRNA Vac-TriS, Pfizer, (PFIZER-BIONT COVID-19 VAC-TRIS) SUSP injection Inject into the muscle.   Erenumab-aooe (AIMOVIG) 70 MG/ML SOAJ INJECT 70 MG INTO THE SKIN EVERY 30 (THIRTY) DAYS.   HUMIRA PEN 40 MG/0.4ML PNKT SMARTSIG:40 Milligram(s) SUB-Q Every 2 Weeks   levocetirizine (XYZAL) 5 MG tablet Take by mouth.   levonorgestrel (MIRENA) 20 MCG/24HR IUD 1 each by Intrauterine route once. Mirena IUD placed 06/25/2019   montelukast (SINGULAIR) 10 MG tablet Take by mouth.   spironolactone (ALDACTONE) 50 MG tablet Take 50 mg by mouth daily.   triamcinolone cream (KENALOG) 0.1 % Apply 1 application topically 2 (two) times daily.   Ubrogepant (UBRELVY) 100 MG TABS Take 1 tab at onset of migraine. May repeat in 2 hrs, if needed. Max dose: 2 tabs/day or 10 tabs/30 days.   [DISCONTINUED] lisdexamfetamine (VYVANSE) 20  MG capsule Take 1 capsule (20 mg total) by mouth daily.    Allergies  Allergen Reactions   Contrast Media [Iodinated Diagnostic Agents] Rash    Patient Active Problem List   Diagnosis Date Noted   Major depressive disorder, recurrent episode, severe (HCC) 12/15/2020   Generalized anxiety disorder 12/15/2020   Attention deficit hyperactivity disorder (ADHD), combined type, moderate 12/15/2020   Abnormal weight gain 08/16/2020   Concentration deficit 08/16/2020   Family history of attention deficit hyperactivity disorder (ADHD) 08/16/2020   Memory loss 04/18/2020    Involuntary movements 04/18/2020   Chronic migraine w/o aura w/o status migrainosus, not intractable 04/18/2020   Status post labral repair of shoulder 10/28/2019   Glenoid labral tear, right, subsequent encounter 09/29/2019   Acute pain of right shoulder 12/22/2018   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   Cervical radiculitis 12/29/2015   Lumbar radiculopathy 12/29/2015   Seasonal allergies 07/04/2011   Irregular menses 07/04/2011   Headache(784.0) 07/04/2011    Family History  Problem Relation Age of Onset   Migraines Mother    Hypercholesterolemia Mother    Hypertension Father    Cancer Maternal Grandfather        pancreatitis --> pancreatic CA    Social History   Tobacco Use  Smoking Status Never  Smokeless Tobacco Never    Past Surgical History:  Procedure Laterality Date   labrial repair     TONSILLECTOMY AND ADENOIDECTOMY     TYMPANOSTOMY TUBE PLACEMENT      Immunization History  Administered Date(s) Administered   HPV 9-valent 11/25/2018   HPV Quadrivalent 05/22/2018, 07/24/2018   Meningococcal Conjugate 10/28/2013   PFIZER Comirnaty(Gray Top)Covid-19 Tri-Sucrose Vaccine 10/09/2020   Tdap 10/28/2013    No results found for this or any previous visit (from the past 2160 hour(s)).  No results found.     All questions at time of visit were answered - patient instructed to contact office with any additional concerns or updates. ER/RTC precautions were reviewed with the patient as applicable.   Please note: manual typing as well as voice recognition software may have been used to produce this document - typos may escape review. Please contact Dr. Lyn Hollingshead for any needed clarifications.

## 2020-12-27 NOTE — Patient Instructions (Signed)
Will start w/ Vyvanse (If not covered may need to switch to Adderall first) Will check in in 2 weeks here in office  Will send notes to neurology - they should reach out to discuss injections / headaches   Insomnia  OK to take Melatonin 2-10 mg bedtime/dinnertime OK to take Benadryl 50 mg bedtime

## 2020-12-28 ENCOUNTER — Other Ambulatory Visit: Payer: Self-pay

## 2020-12-28 DIAGNOSIS — Z111 Encounter for screening for respiratory tuberculosis: Secondary | ICD-10-CM

## 2020-12-28 NOTE — Progress Notes (Signed)
Ordered labs

## 2020-12-31 LAB — QUANTIFERON-TB GOLD PLUS
Mitogen-NIL: >10 IU/mL
NIL: 0.03 IU/mL
QuantiFERON-TB Gold Plus: NEGATIVE
TB1-NIL: 0 IU/mL
TB2-NIL: 0.01 IU/mL

## 2021-01-03 DIAGNOSIS — J3081 Allergic rhinitis due to animal (cat) (dog) hair and dander: Secondary | ICD-10-CM | POA: Diagnosis not present

## 2021-01-09 ENCOUNTER — Ambulatory Visit: Payer: BC Managed Care – PPO | Admitting: Osteopathic Medicine

## 2021-01-09 ENCOUNTER — Encounter: Payer: Self-pay | Admitting: Osteopathic Medicine

## 2021-01-09 ENCOUNTER — Other Ambulatory Visit: Payer: Self-pay

## 2021-01-09 VITALS — BP 125/77 | HR 86 | Temp 98.6°F | Wt 237.0 lb

## 2021-01-09 DIAGNOSIS — J3081 Allergic rhinitis due to animal (cat) (dog) hair and dander: Secondary | ICD-10-CM | POA: Diagnosis not present

## 2021-01-09 DIAGNOSIS — F909 Attention-deficit hyperactivity disorder, unspecified type: Secondary | ICD-10-CM

## 2021-01-09 MED ORDER — AMPHETAMINE-DEXTROAMPHET ER 10 MG PO CP24: 20.0000 mg | ORAL_CAPSULE | Freq: Every day | ORAL | 0 refills | Status: DC

## 2021-01-09 NOTE — Progress Notes (Signed)
Ashlee Newton is a 25 y.o. female who presents to  [facility] at [facility]  today, 01/09/21, seeking care for the following:  Follow up ADHD - started on Adderall two weeks ago at 10 mg dose.  Patient reports that this is helping a good bit but seems to be wearing out around 3:00 in the afternoon.  No side effects concerning for increased anxiety/palpitations/insomnia though she does note she is under a bit more stress recently due to hospitalization of a family member so is hard to tell.     ASSESSMENT & PLAN with other pertinent findings:  The encounter diagnosis was Attention deficit hyperactivity disorder (ADHD), unspecified ADHD type, moderate - starting stimulant Rx 12/2020.   Increase Adderall from 10 mg to 20 mg daily, this may help with duration of action but if it does not she is probably just a fast metabolizer of this medication and would consider switching to alternative long-acting stimulant versus adding short acting stimulant in the afternoons to take as needed.  If anxiety worse, let me know  Patient Instructions  Will increase Adderall from 10 mg to 20 mg daily. Will send MyChart message next week to check in after you've been on higher dose a week!   No orders of the defined types were placed in this encounter.   Meds ordered this encounter  Medications   amphetamine-dextroamphetamine (ADDERALL XR) 10 MG 24 hr capsule    Sig: Take 2 capsules (20 mg total) by mouth daily.    Dispense:  30 capsule    Refill:  0     See below for relevant physical exam findings  See below for recent lab and imaging results reviewed  Medications, allergies, PMH, PSH, SocH, FamH reviewed below    Follow-up instructions: Return in about 1 week (around 01/16/2021) for FOLLOW UP VIA MYCHART MESSAGE (SET TO SEND LATER).                                        Exam:  BP 125/77 (BP Location: Left Arm,  Patient Position: Sitting, Cuff Size: Large)   Pulse 86   Temp 98.6 F (37 C) (Oral)   Wt 237 lb 0.6 oz (107.5 kg)   BMI 40.69 kg/m  Constitutional: VS see above. General Appearance: alert, well-developed, well-nourished, NAD Neck: No masses, trachea midline.  Respiratory: Normal respiratory effort. no wheeze, no rhonchi, no rales Cardiovascular: S1/S2 normal, no murmur, no rub/gallop auscultated. RRR.  Musculoskeletal: Gait normal. Symmetric and independent movement of all extremities Neurological: Normal balance/coordination. No tremor. Skin: warm, dry, intact.  Psychiatric: Normal judgment/insight. Normal mood and affect. Oriented x3.   Current Meds  Medication Sig   Azelastine-Fluticasone 137-50 MCG/ACT SUSP SPRAY 1 SPRAY INTO BOTH NOSTRILS TWICE A DAY AS DIRECTED AS NEEDED   Erenumab-aooe (AIMOVIG) 70 MG/ML SOAJ INJECT 70 MG INTO THE SKIN EVERY 30 (THIRTY) DAYS.   HUMIRA PEN 40 MG/0.4ML PNKT SMARTSIG:40 Milligram(s) SUB-Q Every 2 Weeks   levocetirizine (XYZAL) 5 MG tablet Take by mouth.   levonorgestrel (MIRENA) 20 MCG/24HR IUD 1 each by Intrauterine route once. Mirena IUD placed 06/25/2019   montelukast (SINGULAIR) 10 MG tablet Take by mouth.   spironolactone (ALDACTONE) 50 MG tablet Take 50 mg by mouth daily.   triamcinolone cream (KENALOG) 0.1 % Apply 1 application topically 2 (two) times daily.   Ubrogepant (UBRELVY) 100  MG TABS Take 1 tab at onset of migraine. May repeat in 2 hrs, if needed. Max dose: 2 tabs/day or 10 tabs/30 days.   [DISCONTINUED] amphetamine-dextroamphetamine (ADDERALL XR) 10 MG 24 hr capsule Take 1 capsule (10 mg total) by mouth daily.   [DISCONTINUED] COVID-19 mRNA Vac-TriS, Pfizer, (PFIZER-BIONT COVID-19 VAC-TRIS) SUSP injection Inject into the muscle.    Allergies  Allergen Reactions   Contrast Media [Iodinated Diagnostic Agents] Rash   Cholestatin Other (See Comments)    Patient Active Problem List   Diagnosis Date Noted   Major depressive  disorder, recurrent episode, severe (HCC) 12/15/2020   Generalized anxiety disorder 12/15/2020   Attention deficit hyperactivity disorder (ADHD), combined type, moderate 12/15/2020   Chronic migraine w/o aura w/o status migrainosus, not intractable 04/18/2020   Status post labral repair of shoulder 10/28/2019   Glenoid labral tear, right, subsequent encounter 09/29/2019   Acute pain of right shoulder 12/22/2018   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   Cervical radiculitis 12/29/2015   Lumbar radiculopathy 12/29/2015   Seasonal allergies 07/04/2011   Irregular menses 07/04/2011   Headache(784.0) 07/04/2011    Family History  Problem Relation Age of Onset   Migraines Mother    Hypercholesterolemia Mother    Hypertension Father    Cancer Maternal Grandfather        pancreatitis --> pancreatic CA    Social History   Tobacco Use  Smoking Status Never  Smokeless Tobacco Never    Past Surgical History:  Procedure Laterality Date   labrial repair     TONSILLECTOMY AND ADENOIDECTOMY     TYMPANOSTOMY TUBE PLACEMENT      Immunization History  Administered Date(s) Administered   HPV 9-valent 11/25/2018   HPV Quadrivalent 05/22/2018, 07/24/2018   Meningococcal Conjugate 10/28/2013   PFIZER Comirnaty(Gray Top)Covid-19 Tri-Sucrose Vaccine 10/09/2020   Tdap 10/28/2013    Recent Results (from the past 2160 hour(s))  QuantiFERON-TB Gold Plus     Status: None   Collection Time: 12/29/20 12:00 AM  Result Value Ref Range   QuantiFERON-TB Gold Plus NEGATIVE NEGATIVE    Comment: Negative test result. M. tuberculosis complex  infection unlikely.    NIL 0.03 IU/mL   Mitogen-NIL >10.00 IU/mL   TB1-NIL 0.00 IU/mL   TB2-NIL 0.01 IU/mL    Comment: . The Nil tube value reflects the background interferon gamma immune response of the patient's blood sample. This value has been subtracted from the patient's displayed TB and Mitogen results. . Lower than expected  results with the Mitogen tube prevent false-negative Quantiferon readings by detecting a patient with a potential immune suppressive condition and/or suboptimal pre-analytical specimen handling. . The TB1 Antigen tube is coated with the M. tuberculosis-specific antigens designed to elicit responses from TB antigen primed CD4+ helper T-lymphocytes. . The TB2 Antigen tube is coated with the M. tuberculosis-specific antigens designed to elicit responses from TB antigen primed CD4+ helper and CD8+ cytotoxic T-lymphocytes. . For additional information, please refer to https://education.questdiagnostics.com/faq/FAQ204 (This link is being provided for informational/ educational purposes only.) .     No results found.     All questions at time of visit were answered - patient instructed to contact office with any additional concerns or updates. ER/RTC precautions were reviewed with the patient as applicable.   Please note: manual typing as well as voice recognition software may have been used to produce this document - typos may escape review. Please contact Dr. Lyn Hollingshead for any needed clarifications.   Total encounter  time on date of service, 01/09/21, was 20 minutes spent addressing problems/issues as noted above in Assessment & Plan, including time spent in discussion with patient regarding the HPI, ROS, confirming history, reviewing Assessment & Plan, as well as time spent on coordination of care, record review.

## 2021-01-09 NOTE — Patient Instructions (Signed)
Will increase Adderall from 10 mg to 20 mg daily. Will send MyChart message next week to check in after you've been on higher dose a week!

## 2021-01-10 ENCOUNTER — Ambulatory Visit: Payer: BC Managed Care – PPO | Admitting: Osteopathic Medicine

## 2021-01-15 ENCOUNTER — Telehealth: Payer: Self-pay | Admitting: *Deleted

## 2021-01-15 NOTE — Telephone Encounter (Signed)
Received approval from [employer] for Ubrelvy 100mg , #10/30. PA approved through 01/10/2022. Member 01/12/2022. [id number]. [employer] ph:[phone].

## 2021-01-16 DIAGNOSIS — J3081 Allergic rhinitis due to animal (cat) (dog) hair and dander: Secondary | ICD-10-CM | POA: Diagnosis not present

## 2021-01-23 DIAGNOSIS — J3081 Allergic rhinitis due to animal (cat) (dog) hair and dander: Secondary | ICD-10-CM | POA: Diagnosis not present

## 2021-01-24 DIAGNOSIS — L700 Acne vulgaris: Secondary | ICD-10-CM | POA: Diagnosis not present

## 2021-01-24 DIAGNOSIS — L910 Hypertrophic scar: Secondary | ICD-10-CM | POA: Diagnosis not present

## 2021-01-25 ENCOUNTER — Other Ambulatory Visit: Payer: Self-pay

## 2021-01-25 ENCOUNTER — Encounter: Payer: Self-pay | Admitting: Obstetrics and Gynecology

## 2021-01-25 ENCOUNTER — Other Ambulatory Visit (HOSPITAL_COMMUNITY)
Admission: RE | Admit: 2021-01-25 | Discharge: 2021-01-25 | Disposition: A | Discharge status: Home / Self Care | Payer: BC Managed Care – PPO | Source: Ambulatory Visit | Attending: Obstetrics and Gynecology | Admitting: Obstetrics and Gynecology

## 2021-01-25 ENCOUNTER — Ambulatory Visit: Payer: BC Managed Care – PPO | Admitting: Obstetrics and Gynecology

## 2021-01-25 VITALS — BP 132/81 | HR 90 | Ht 64.0 in | Wt 236.0 lb

## 2021-01-25 DIAGNOSIS — M62838 Other muscle spasm: Secondary | ICD-10-CM

## 2021-01-25 DIAGNOSIS — R102 Pelvic and perineal pain: Secondary | ICD-10-CM

## 2021-01-25 DIAGNOSIS — Z113 Encounter for screening for infections with a predominantly sexual mode of transmission: Secondary | ICD-10-CM | POA: Diagnosis not present

## 2021-01-25 NOTE — Patient Instructions (Signed)
  Levator Spasm!

## 2021-01-25 NOTE — Telephone Encounter (Signed)
Patient left a vm msg on 01/24/21 requesting a med refill for Adderall. Per patient, she does not have any med on hand. Rx pended.

## 2021-01-25 NOTE — Progress Notes (Signed)
   GYNECOLOGY OFFICE VISIT NOTE  History:   Ashlee Newton is a 25 y.o. G0P0000 here today for intermittent pelvic pain that is in her lower pelvis. It is not on a particular side but can be one side or both. Sometimes it is midline. It is worse when she sneezes, bears down, or does anything that involves pressure.  She is sometimes sexually active and doesn't usually have pain but sometimes does. It started in march but she forgot to mention it at her annual. She has an IUD and does not have regular cycles with it - it has not caused her any problems, she does not feel the strings.  She denies any abnormal vaginal discharge, bleeding, pelvic pain or other concerns.   The following portions of the patient's history were reviewed and updated as appropriate: allergies, current medications, past family history, past medical history, past social history, past surgical history and problem list.   Health Maintenance:  Normal pap on 05/2019.    Review of Systems:  Pertinent items noted in HPI and remainder of comprehensive ROS otherwise negative.  Physical Exam:  BP 132/81   Pulse 90   Ht 5\' 4"  (1.626 m)   Wt 236 lb (107 kg)   BMI 40.51 kg/m  CONSTITUTIONAL: Well-developed, well-nourished female in no acute distress.  HEENT:  Normocephalic, atraumatic. External right and left ear normal. No scleral icterus.  NECK: Normal range of motion, supple, no masses noted on observation SKIN: No rash noted. Not diaphoretic. No erythema. No pallor. MUSCULOSKELETAL: Normal range of motion. No edema noted. NEUROLOGIC: Alert and oriented to person, place, and time. Normal muscle tone coordination. No cranial nerve deficit noted. PSYCHIATRIC: Normal mood and affect. Normal behavior. Normal judgment and thought content.  CARDIOVASCULAR: Normal heart rate noted RESPIRATORY: Effort and breath sounds normal, no problems with respiration noted ABDOMEN: No masses noted. No other overt distention noted.    PELVIC:  Normal appearing external genitalia; normal urethral meatus; normal appearing vaginal mucosa and cervix.  No abnormal discharge noted.  Normal uterine size, no other palpable masses, no uterine or adnexal tenderness. She had bilateral levator tenderness with spasm noted particularly on the right with external rotation of the right leg. [Per pt, this is the same pain she feels when the pain arises] Performed in the presence of a chaperone  Assessment and Plan:    1. Levator spasm - Discussed causes of levator spasm - cultures done to rule this out especially since she has an IUD - Discussed PFPT and efficacy for this treatment and typical treatment course - Discussed supportive measures I.e. motrin or alleve - Cervicovaginal ancillary only( [place]) - Ambulatory referral to Physical Therapy   Routine preventative health maintenance measures emphasized. Please refer to After Visit Summary for other counseling recommendations.   Return in about 6 months (around 07/25/2021) for annual.     09/24/2021, MD, FACOG Obstetrician & Gynecologist, [facility], [facility]

## 2021-01-26 LAB — CERVICOVAGINAL ANCILLARY ONLY
Bacterial Vaginitis (gardnerella): NEGATIVE
Candida Glabrata: NEGATIVE
Candida Vaginitis: NEGATIVE
Chlamydia: NEGATIVE
Comment: NEGATIVE
Comment: NEGATIVE
Comment: NEGATIVE
Comment: NEGATIVE
Comment: NEGATIVE
Comment: NORMAL
Neisseria Gonorrhea: NEGATIVE
Trichomonas: NEGATIVE

## 2021-01-26 MED ORDER — AMPHETAMINE-DEXTROAMPHET ER 10 MG PO CP24: 20.0000 mg | ORAL_CAPSULE | Freq: Every day | ORAL | 0 refills | Status: DC

## 2021-01-30 ENCOUNTER — Encounter: Payer: Self-pay | Admitting: Osteopathic Medicine

## 2021-02-01 MED ORDER — METHYLPHENIDATE HCL ER (CD) 20 MG PO CPCR: 20.0000 mg | ORAL_CAPSULE | ORAL | 0 refills | Status: DC

## 2021-02-28 ENCOUNTER — Telehealth (INDEPENDENT_AMBULATORY_CARE_PROVIDER_SITE_OTHER): Payer: BC Managed Care – PPO | Admitting: Medical-Surgical

## 2021-02-28 ENCOUNTER — Encounter: Payer: Self-pay | Admitting: Medical-Surgical

## 2021-02-28 VITALS — Wt 236.0 lb

## 2021-02-28 DIAGNOSIS — F411 Generalized anxiety disorder: Secondary | ICD-10-CM

## 2021-02-28 DIAGNOSIS — F909 Attention-deficit hyperactivity disorder, unspecified type: Secondary | ICD-10-CM

## 2021-02-28 MED ORDER — METHYLPHENIDATE HCL ER (CD) 30 MG PO CPCR: 30.0000 mg | ORAL_CAPSULE | ORAL | 0 refills | Status: DC

## 2021-02-28 MED ORDER — HYDROXYZINE HCL 50 MG PO TABS: 25.0000 mg | ORAL_TABLET | Freq: Every evening | ORAL | 3 refills | Status: DC | PRN

## 2021-02-28 NOTE — Progress Notes (Signed)
Virtual Visit via Video Note  I connected with Ashlee Newton on 02/28/21 at  3:40 PM EDT by a video enabled telemedicine application and verified that I am speaking with the correct person using two identifiers.   I discussed the limitations of evaluation and management by telemedicine and the availability of in person appointments. The patient expressed understanding and agreed to proceed.  Patient location: home Provider locations: office  Subjective:    CC: ADHD follow-up/anxiety  HPI: Pleasant 25 year old female presenting today via MyChart video visit for ADHD follow-up.  She has been taking Metadate CR 20 mg daily, tolerating well without side effects.  Takes her dose when she first wakes up in the morning around 6 AM.  Notes that the medication works well all but it could work better.  Reports the medication does wear off in the afternoon around 3 PM but she does not have headaches with this like she did the Adderall.  Eating and drinking without difficulty.  No change in appetite or weight.  Sleeping okay.  Does note that she has had a little bit of worsening anxiety recently especially surrounding school.  Wants to know if there is something she can take on an as-needed basis to help with managing the anxiety.  Had a panic attack in the middle of the night when she woke up worried about school stuff and had a hard time getting back to sleep.  Past medical history, Surgical history, Family history not pertinant except as noted below, Social history, Allergies, and medications have been entered into the medical record, reviewed, and corrections made.   Review of Systems: See HPI for pertinent positives and negatives.   Objective:    General: Speaking clearly in complete sentences without any shortness of breath.  Alert and oriented x3.  Normal judgment. No apparent acute distress.  Impression and Recommendations:    1. Attention deficit hyperactivity disorder (ADHD), unspecified ADHD  type, moderate - starting stimulant Rx 12/2020 Since she reports that the medication could work better to help with focus, increasing Metadate to 30 mg daily.  2. Anxiety state Anxiety surrounding school is not unusual but it is a bit concerning when she is having panic attacks straight out of sleep and overnight.  Sending in hydroxyzine 25-50 mg at bedtime as needed for anxiety.  Okay to repeat x1 throughout the day after she is able to determine if the doses make her too sleepy.  Patient verbalized understanding to try the medication out prior to taking it while she is at school or work.  I discussed the assessment and treatment plan with the patient. The patient was provided an opportunity to ask questions and all were answered. The patient agreed with the plan and demonstrated an understanding of the instructions.   The patient was advised to call back or seek an in-person evaluation if the symptoms worsen or if the condition fails to improve as anticipated.  20 minutes of non-face-to-face time was provided during this encounter.  Return in about 4 weeks (around 03/28/2021) for ADHD follow up, mood follow up.  Thayer Ohm, DNP, APRN, FNP-BC [facility]

## 2021-02-28 NOTE — Progress Notes (Signed)
PHQ - 7 GAD - 13 Metadate - works well but wears off in the afternoon No longer getting headaches Would like as needed anxiety meds

## 2021-03-07 ENCOUNTER — Emergency Department (HOSPITAL_COMMUNITY)
Admission: EM | Admit: 2021-03-07 | Discharge: 2021-03-07 | Disposition: A | Discharge status: Home / Self Care | Payer: BC Managed Care – PPO | Attending: Emergency Medicine | Admitting: Emergency Medicine

## 2021-03-07 ENCOUNTER — Encounter (HOSPITAL_COMMUNITY): Payer: Self-pay | Admitting: *Deleted

## 2021-03-07 ENCOUNTER — Other Ambulatory Visit: Payer: Self-pay

## 2021-03-07 ENCOUNTER — Telehealth: Payer: Self-pay | Admitting: Emergency Medicine

## 2021-03-07 ENCOUNTER — Ambulatory Visit: Payer: Self-pay

## 2021-03-07 ENCOUNTER — Emergency Department (HOSPITAL_COMMUNITY): Payer: BC Managed Care – PPO

## 2021-03-07 DIAGNOSIS — R6884 Jaw pain: Secondary | ICD-10-CM | POA: Diagnosis not present

## 2021-03-07 DIAGNOSIS — S0300XA Dislocation of jaw, unspecified side, initial encounter: Secondary | ICD-10-CM

## 2021-03-07 DIAGNOSIS — X58XXXA Exposure to other specified factors, initial encounter: Secondary | ICD-10-CM | POA: Diagnosis not present

## 2021-03-07 DIAGNOSIS — S0302XA Dislocation of jaw, left side, initial encounter: Secondary | ICD-10-CM | POA: Insufficient documentation

## 2021-03-07 DIAGNOSIS — S0990XA Unspecified injury of head, initial encounter: Secondary | ICD-10-CM | POA: Diagnosis not present

## 2021-03-07 IMAGING — DX DG MANDIBLE 4+V
4 series · 4 of 4 positions shown · non-contrast
Comparison: None.

CLINICAL DATA: Left jaw pain.  Left jaw reduction.

EXAM:
MANDIBLE - 4+ VIEW

[mandible pa]
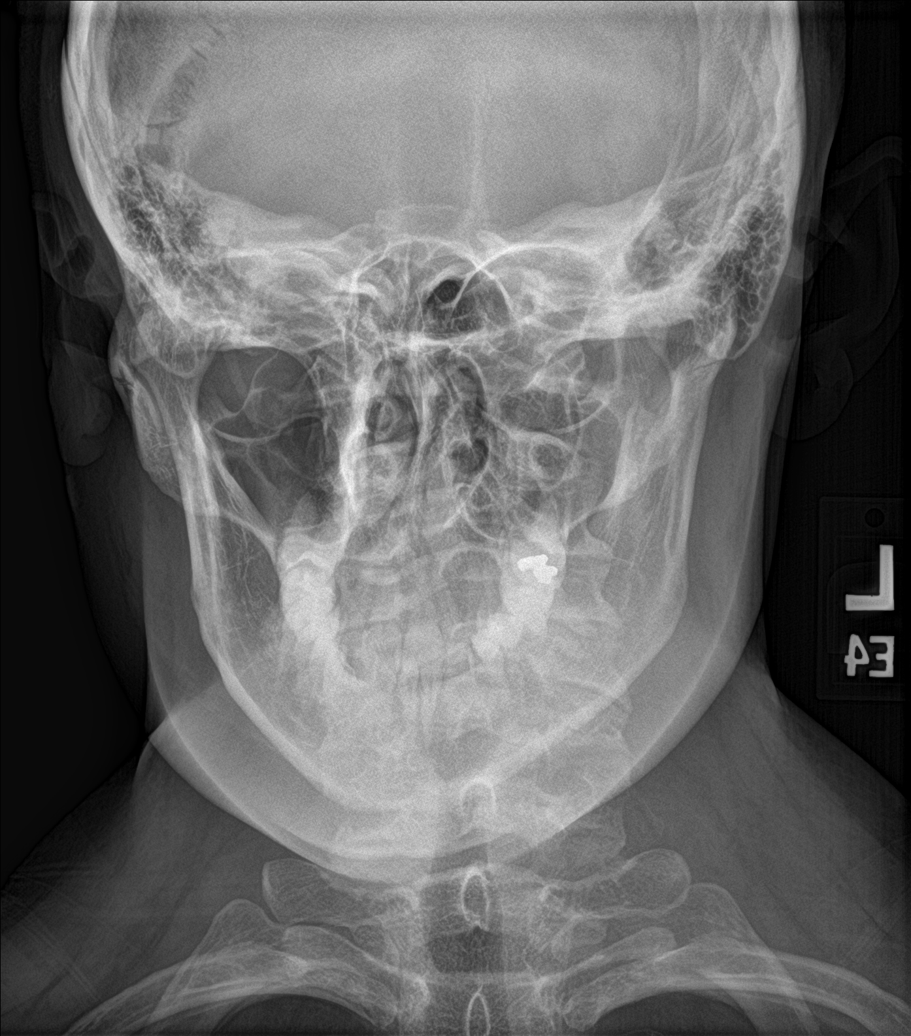

[mandible townes]
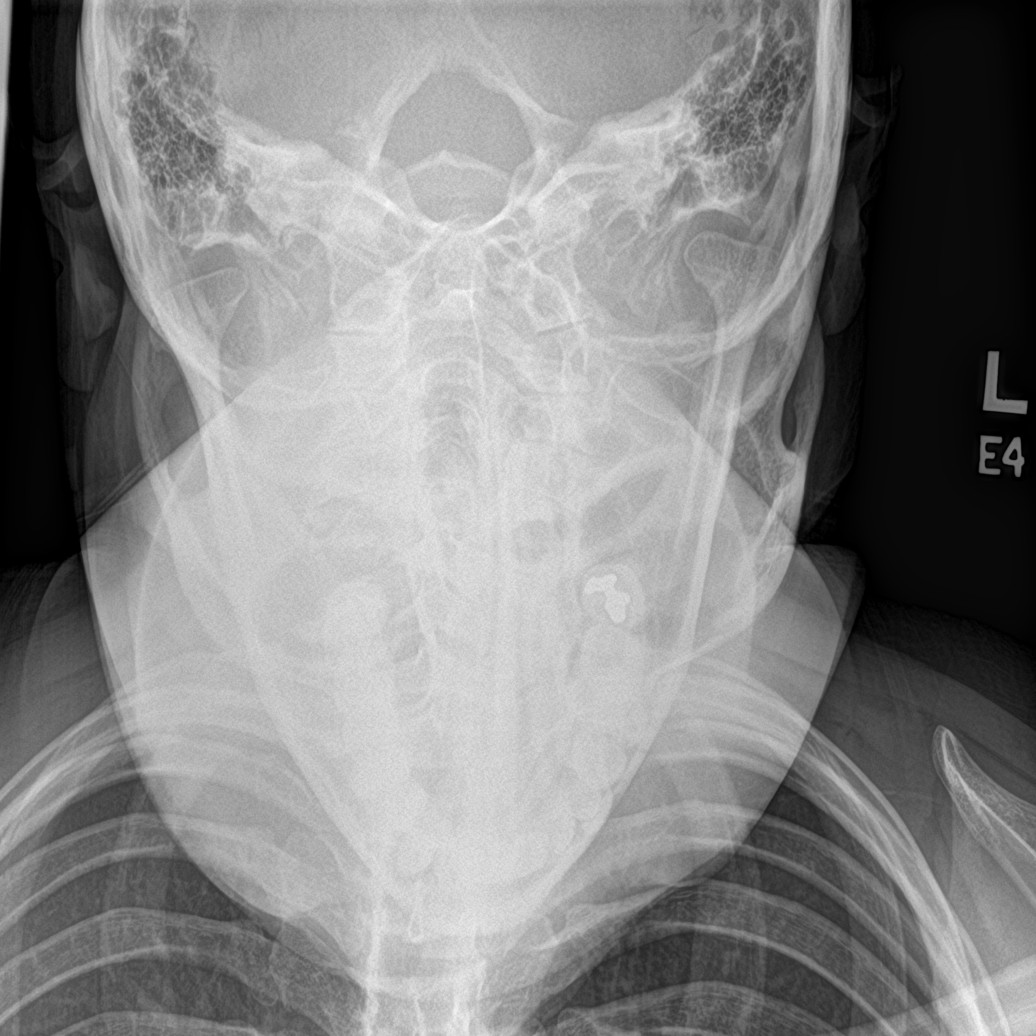

[mandible lat (1 of 2)]
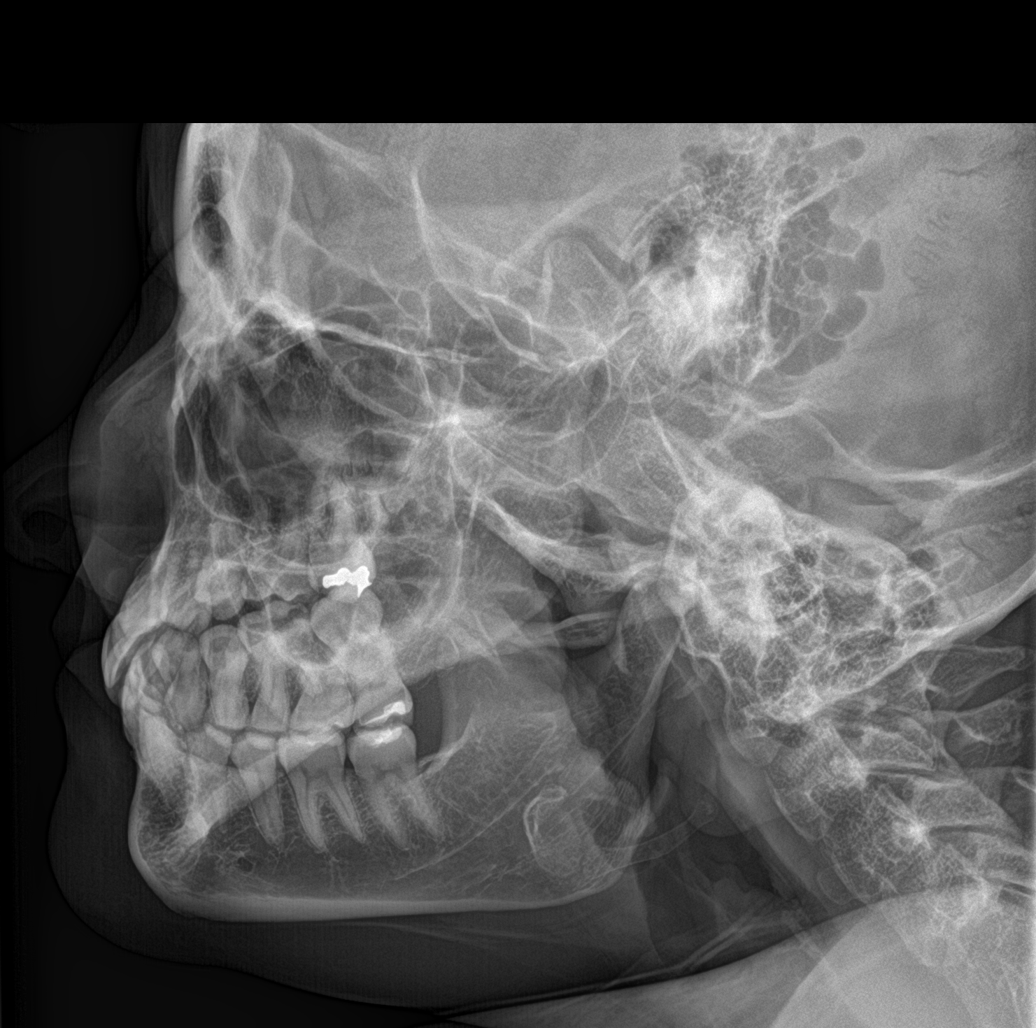

[mandible lat (2 of 2)]
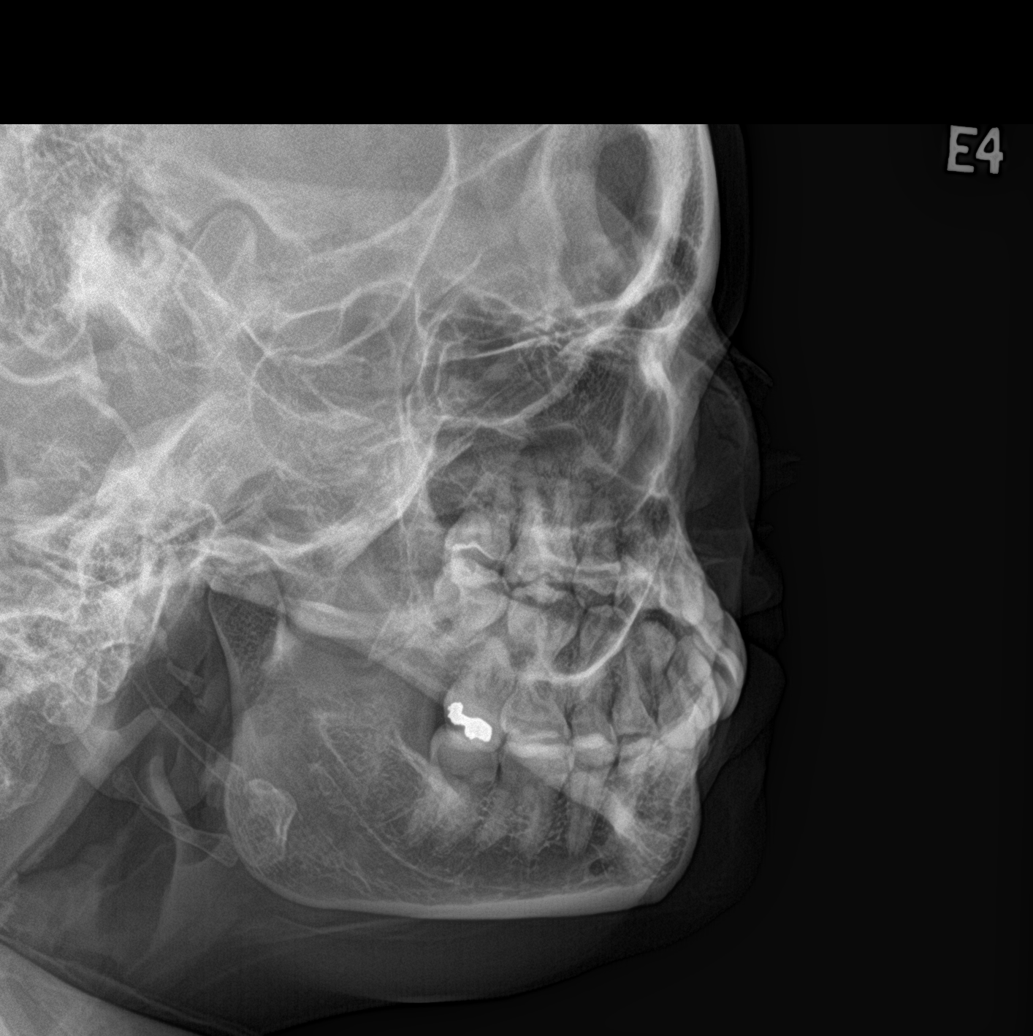

[4 of 4 positions shown; findings below may reference images not displayed]

FINDINGS: There is no evidence of fracture or other focal bone lesions. Normal
alignment of the TMJ bilaterally. No significant arthropathy.
IMPRESSION: Negative.

## 2021-03-07 NOTE — Discharge Instructions (Addendum)
X-rays are normal.  There is no fracture or dislocation remaining.  Advised soft diet until cleared by the oral maxillofacial surgeon. Call the oral surgeon within the next 2 to 3 days to arrange follow-up. Return immediately back to the ER if:  Your symptoms worsen within the next 12-24 hours. You develop new symptoms such as new fevers, persistent vomiting, new pain, shortness of breath, or new weakness or numbness, or if you have any other concerns.

## 2021-03-07 NOTE — Telephone Encounter (Signed)
Call back from pt regarding reason for visit - pt states her jaw is dislocated- denies an injury - pt states she can pop her jaw back in - provider updated, pt & redirected to ED

## 2021-03-07 NOTE — ED Provider Notes (Signed)
[facility] EMERGENCY DEPARTMENT Provider Note   CSN: [id number] Arrival date & time: 03/07/21  1351     History Chief Complaint  Patient presents with   Jaw Pain    Ashlee Newton is a 25 y.o. female.  Patient presents to ER chief complaint of left jaw pain.  She states that she opens her mouth widely and felt her left jaw pop out.  She states she is had jaw problems in the past with some pain and discomfort but never had a dislocation.  She was then unable to close her mouth fully and decided come to the ER.  Otherwise denies any fevers or cough or vomiting or diarrhea.  However while she was waiting in the ER her jaw popped back into place per patient.      Past Medical History:  Diagnosis Date   Allergies    Allergy    Attention deficit hyperactivity disorder (ADHD), combined type, moderate 12/15/2020   Generalized anxiety disorder 12/15/2020   Head pain    Major depressive disorder, recurrent episode, severe (HCC) 12/15/2020   Migraine    Psoriasis    Psoriatic arthritis (HCC)     Patient Active Problem List   Diagnosis Date Noted   Major depressive disorder, recurrent episode, severe (HCC) 12/15/2020   Generalized anxiety disorder 12/15/2020   Attention deficit hyperactivity disorder (ADHD), combined type, moderate 12/15/2020   Chronic migraine w/o aura w/o status migrainosus, not intractable 04/18/2020   Status post labral repair of shoulder 10/28/2019   Glenoid labral tear, right, subsequent encounter 09/29/2019   Acute pain of right shoulder 12/22/2018   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   Cervical radiculitis 12/29/2015   Lumbar radiculopathy 12/29/2015   Seasonal allergies 07/04/2011   Irregular menses 07/04/2011   Headache(784.0) 07/04/2011    Past Surgical History:  Procedure Laterality Date   labrial repair     TONSILLECTOMY AND ADENOIDECTOMY     TYMPANOSTOMY TUBE PLACEMENT       OB History     Gravida  0   Para  0    Term  0   Preterm  0   AB  0   Living  0      SAB  0   IAB  0   Ectopic  0   Multiple  0   Live Births  0           Family History  Problem Relation Age of Onset   Migraines Mother    Hypercholesterolemia Mother    Hypertension Father    Cancer Maternal Grandfather        pancreatitis --> pancreatic CA    Social History   Tobacco Use   Smoking status: Never   Smokeless tobacco: Never  Vaping Use   Vaping Use: Never used  Substance Use Topics   Alcohol use: Not Currently   Drug use: Never    Home Medications Prior to Admission medications   Medication Sig Start Date End Date Taking? Authorizing Provider  Azelastine-Fluticasone 137-50 MCG/ACT SUSP SPRAY 1 SPRAY INTO BOTH NOSTRILS TWICE A DAY AS DIRECTED AS NEEDED 06/14/20   [provider]  Erenumab-aooe (AIMOVIG) 70 MG/ML SOAJ INJECT 70 MG INTO THE SKIN EVERY 30 (THIRTY) DAYS. 12/07/20   Sunnie Nielsen, DO  HUMIRA PEN 40 MG/0.4ML PNKT SMARTSIG:40 Milligram(s) SUB-Q Every 2 Weeks 08/17/20   [provider]  hydrOXYzine (ATARAX/VISTARIL) 50 MG tablet Take 0.5-1 tablets (25-50 mg total) by mouth at  bedtime and may repeat dose one time if needed. For itching. 02/28/21   Christen Butter, NP  levocetirizine (XYZAL) 5 MG tablet Take by mouth.    [provider]  levonorgestrel (MIRENA) 20 MCG/24HR IUD 1 each by Intrauterine route once. Mirena IUD placed 06/25/2019    [provider]  methylphenidate (METADATE CD) 30 MG CR capsule Take 1 capsule (30 mg total) by mouth every morning. 02/28/21   Christen Butter, NP  montelukast (SINGULAIR) 10 MG tablet Take by mouth.    [provider]  spironolactone (ALDACTONE) 50 MG tablet Take 50 mg by mouth daily. 05/25/20   [provider]  triamcinolone cream (KENALOG) 0.1 % Apply 1 application topically 2 (two) times daily. 05/23/16   Lattie Haw, MD  Ubrogepant (UBRELVY) 100 MG TABS Take 1 tab at onset of migraine. May repeat in  2 hrs, if needed. Max dose: 2 tabs/day or 10 tabs/30 days. 09/14/20   Glean Salvo, NP    Allergies    Contrast media [iodinated diagnostic agents] and Cholestatin  Review of Systems   Review of Systems  Constitutional:  Negative for fever.  HENT:  Negative for ear pain.   Eyes:  Negative for pain.  Respiratory:  Negative for cough.   Cardiovascular:  Negative for chest pain.  Gastrointestinal:  Negative for abdominal pain.  Genitourinary:  Negative for flank pain.  Musculoskeletal:  Negative for back pain.  Skin:  Negative for rash.  Neurological:  Negative for headaches.   Physical Exam Updated Vital Signs BP (!) 139/94 (BP Location: Right Arm)   Pulse (!) 103   Temp 98 F (36.7 C) (Temporal)   Resp 20   SpO2 100%   Physical Exam Constitutional:      General: She is not in acute distress.    Appearance: Normal appearance.  HENT:     Head: Normocephalic.     Comments: No tenderness at the angle of the jaw or mandible.  Patient able to open and close her mouth, describes normal feeling occlusion per patient.    Nose: Nose normal.  Eyes:     Extraocular Movements: Extraocular movements intact.  Cardiovascular:     Rate and Rhythm: Normal rate.  Pulmonary:     Effort: Pulmonary effort is normal.  Musculoskeletal:        General: Normal range of motion.     Cervical back: Normal range of motion.  Neurological:     General: No focal deficit present.     Mental Status: She is alert. Mental status is at baseline.    ED Results / Procedures / Treatments   Labs (all labs ordered are listed, but only abnormal results are displayed) Labs Reviewed - No data to display  EKG None  Radiology DG Mandible 4 Views  Result Date: 03/07/2021 CLINICAL DATA:  Left jaw pain.  Left jaw reduction. EXAM: MANDIBLE - 4+ VIEW COMPARISON:  None. FINDINGS: There is no evidence of fracture or other focal bone lesions. Normal alignment of the TMJ bilaterally. No significant arthropathy.  IMPRESSION: Negative. Electronically Signed   By: Marlan Palau M.D.   On: 03/07/2021 16:27    Procedures Procedures   Medications Ordered in ED Medications - No data to display  ED Course  I have reviewed the triage vital signs and the nursing notes.  Pertinent labs & imaging results that were available during my care of the patient were reviewed by me and considered in my medical decision making (see  chart for details).    MDM Rules/Calculators/A&P                           Clinically appears to be no evidence of dislocation at this time consistent with patient's history of self reduction.  X-ray performed with no abnormality noted.  Recommending soft diet at home, recommending outpatient follow-up with oral maxillofacial surgeon within the week.  Advised immediate return for worsening pain or any additional concerns.  Final Clinical Impression(s) / ED Diagnoses Final diagnoses:  Dislocation of temporomandibular joint, initial encounter    Rx / DC Orders ED Discharge Orders     None        Cheryll Cockayne, MD 03/07/21 [phone]

## 2021-03-07 NOTE — ED Triage Notes (Signed)
Pt with left jaw pain, pt states her jaw is dislocated, unable to open her mouth.

## 2021-03-19 ENCOUNTER — Encounter: Payer: Self-pay | Admitting: Neurology

## 2021-03-19 ENCOUNTER — Telehealth (INDEPENDENT_AMBULATORY_CARE_PROVIDER_SITE_OTHER): Payer: BC Managed Care – PPO | Admitting: Neurology

## 2021-03-19 DIAGNOSIS — R413 Other amnesia: Secondary | ICD-10-CM | POA: Diagnosis not present

## 2021-03-19 DIAGNOSIS — G43709 Chronic migraine without aura, not intractable, without status migrainosus: Secondary | ICD-10-CM

## 2021-03-19 MED ORDER — UBRELVY 100 MG PO TABS: ORAL_TABLET | ORAL | 11 refills | Status: DC

## 2021-03-19 MED ORDER — AIMOVIG 70 MG/ML ~~LOC~~ SOAJ: 70.0000 mg | SUBCUTANEOUS | 3 refills | Status: DC

## 2021-03-19 NOTE — Progress Notes (Signed)
Assessment plan 1.  Chronic migraine headaches 2.  Memory loss Personally reviewed-MRI of the brain with without contrast in September 2021 was normal -Laboratory evaluation (B12, RPR, CBC, CMP, TSH) were normal -Felt Topamax worsened her headaches, reported weight gain with Effexor -Reports no benefit with Imitrex, Maxalt, Tylenol, ibuprofen Responding well with aimovig 70 mg as preventive medication, Ubrelvy 50 mg as needed Refilled her prescription, Continue follow-up with her primary care Return to clinic for new issues   HISTORY OF PRESENT ILLNESS:  Ashlee Newton is a 25 year old female, seen in request by her primary care physician Dr. Sunnie Nielsen for evaluation of migraine headache, initial evaluation was on April 18, 2020.   I reviewed and summarized the referring note.  Past medical history Asthma.   She had long history of migraine headache, become daily since 2018, she describes her headache as bilateral temporal region mild to moderate pressure, sometimes settled at one site pounding, with occasionally noise smell sensitivity, no significant light sensitivity, movement made it worse, taking a nap always helpful   She has tried over-the-counter medications without significant help, she is not taking medicine regularly, but complains of mild daily headaches, she works 2 jobs, also planning on attending [employer],   She also complains of mild memory loss, forgot glasses on the top of her head, word searching the middle of the sentence,   I personally reviewed MRI of the brain without contrast February 07, 2020 that was normal   Laboratory evaluation in 2021: Negative hepatitis A,B,C, negative HIV,  UPDATE Mar 19 2021:  Virtual Visit via video Location: Provider: [facility] office; Patient: Home I connected with Ashlee Newton  on March 19, 2021 by a video enabled telemedicine application and verified that I am speaking with the correct person using two  identifiers.    UPDATE  She was initially treated with Topamax 100 mg twice a day, without significant help, still has frequent headaches, in addition, she complains of memory loss, distracted easily, mind goes blank, sleep deprived EEG showed no significant abnormality,  Also reported weight gain with Effexor, it does help her headache,  She was put on aimovig 70 mg every month as preventive medication, Ubrelvy as needed, current regime works very well, she has migraine 1-2 times each week, responding well to [place], no significant side effect, happy with current combination,  She does not have recurrent passing out spells  Observations/Objective: I have reviewed problem lists, medications, allergies. Awake, alert, oriented to history taking casual conversation, facial symmetric, no dysarthria, no dysphagia, moving 4 extremities without difficulties   REVIEW OF SYSTEMS: Out of a complete 14 system review of symptoms, the patient complains only of the following symptoms, and all other reviewed systems are negative.  Headache   ALLERGIES: Allergies  Allergen Reactions   Contrast Media [Iodinated Diagnostic Agents] Rash   Cholestatin Other (See Comments)    HOME MEDICATIONS: Outpatient Medications Prior to Visit  Medication Sig Dispense Refill   Azelastine-Fluticasone 137-50 MCG/ACT SUSP SPRAY 1 SPRAY INTO BOTH NOSTRILS TWICE A DAY AS DIRECTED AS NEEDED     Erenumab-aooe (AIMOVIG) 70 MG/ML SOAJ INJECT 70 MG INTO THE SKIN EVERY 30 (THIRTY) DAYS. 1 mL 3   HUMIRA PEN 40 MG/0.4ML PNKT SMARTSIG:40 Milligram(s) SUB-Q Every 2 Weeks     hydrOXYzine (ATARAX/VISTARIL) 50 MG tablet Take 0.5-1 tablets (25-50 mg total) by mouth at bedtime and may repeat dose one time if needed. For itching. 45 tablet 3   levocetirizine (XYZAL)  5 MG tablet Take by mouth.     levonorgestrel (MIRENA) 20 MCG/24HR IUD 1 each by Intrauterine route once. Mirena IUD placed 06/25/2019     methylphenidate (METADATE CD)  30 MG CR capsule Take 1 capsule (30 mg total) by mouth every morning. 30 capsule 0   montelukast (SINGULAIR) 10 MG tablet Take by mouth.     spironolactone (ALDACTONE) 50 MG tablet Take 50 mg by mouth daily.     triamcinolone cream (KENALOG) 0.1 % Apply 1 application topically 2 (two) times daily. 30 g 0   Ubrogepant (UBRELVY) 100 MG TABS Take 1 tab at onset of migraine. May repeat in 2 hrs, if needed. Max dose: 2 tabs/day or 10 tabs/30 days. 10 tablet 5   No facility-administered medications prior to visit.    PAST MEDICAL HISTORY: Past Medical History:  Diagnosis Date   Allergies    Allergy    Attention deficit hyperactivity disorder (ADHD), combined type, moderate 12/15/2020   Generalized anxiety disorder 12/15/2020   Head pain    Major depressive disorder, recurrent episode, severe (HCC) 12/15/2020   Migraine    Psoriasis    Psoriatic arthritis (HCC)     PAST SURGICAL HISTORY: Past Surgical History:  Procedure Laterality Date   labrial repair     TONSILLECTOMY AND ADENOIDECTOMY     TYMPANOSTOMY TUBE PLACEMENT      FAMILY HISTORY: Family History  Problem Relation Age of Onset   Migraines Mother    Hypercholesterolemia Mother    Hypertension Father    Cancer Maternal Grandfather        pancreatitis --> pancreatic CA    SOCIAL HISTORY: Social History   Socioeconomic History   Marital status: Single    Spouse name: Not on file   Number of children: 0   Years of education: college   Highest education level: Bachelor's degree (e.g., BA, AB, BS)  Occupational History   Occupation: Museum/gallery conservator   Occupation: registration for [employer]  Tobacco Use   Smoking status: Never   Smokeless tobacco: Never  Vaping Use   Vaping Use: Never used  Substance and Sexual Activity   Alcohol use: Not Currently   Drug use: Never   Sexual activity: Yes    Partners: Male    Birth control/protection: I.U.D.  Other Topics Concern   Not on file  Social History Narrative   Lives with her  sister.   Right-handed.   Caffeine use: 2-3 cups per day.   Social Determinants of Health   Financial Resource Strain: Not on file  Food Insecurity: Not on file  Transportation Needs: Not on file  Physical Activity: Not on file  Stress: Not on file  Social Connections: Not on file  Intimate Partner Violence: Not on file   Levert Feinstein, M.D. Ph.D.  [facility] [address] [place], [place] [address] Phone: [phone] Fax:      [phone]

## 2021-03-21 ENCOUNTER — Telehealth: Payer: Self-pay | Admitting: *Deleted

## 2021-03-21 NOTE — Telephone Encounter (Signed)
Initiated PA on [email or web address] (phone [phone]) Could not complete on CMM/states they do not handle PA's. Have to use above website. Prior Auth (EOC) ID:  [id number]. MemberID: [id number].   Submitted, waiting on determination.

## 2021-03-22 NOTE — Telephone Encounter (Signed)
Received fax from [employer]benef[employer] that PA approved 03/22/21-03/21/22. EOC# [id number].

## 2021-03-23 NOTE — Telephone Encounter (Signed)
This encounter was created in error - please disregard.

## 2021-03-23 NOTE — Telephone Encounter (Deleted)
Medication: Erenumab-aooe (AIMOVIG) 70 MG/ML SOAJ Prior authorization submitted via CoverMyMeds on 03/23/2021 PA submission pending

## 2021-04-17 DIAGNOSIS — J3089 Other allergic rhinitis: Secondary | ICD-10-CM | POA: Diagnosis not present

## 2021-04-17 DIAGNOSIS — J3081 Allergic rhinitis due to animal (cat) (dog) hair and dander: Secondary | ICD-10-CM | POA: Diagnosis not present

## 2021-04-17 DIAGNOSIS — J301 Allergic rhinitis due to pollen: Secondary | ICD-10-CM | POA: Diagnosis not present

## 2021-04-18 DIAGNOSIS — J3089 Other allergic rhinitis: Secondary | ICD-10-CM | POA: Diagnosis not present

## 2021-04-18 DIAGNOSIS — J301 Allergic rhinitis due to pollen: Secondary | ICD-10-CM | POA: Diagnosis not present

## 2021-04-18 DIAGNOSIS — J3081 Allergic rhinitis due to animal (cat) (dog) hair and dander: Secondary | ICD-10-CM | POA: Diagnosis not present

## 2021-05-24 DIAGNOSIS — L4059 Other psoriatic arthropathy: Secondary | ICD-10-CM | POA: Diagnosis not present

## 2021-05-24 DIAGNOSIS — L409 Psoriasis, unspecified: Secondary | ICD-10-CM | POA: Diagnosis not present

## 2021-06-18 ENCOUNTER — Encounter: Payer: Self-pay | Admitting: Medical-Surgical

## 2021-06-25 ENCOUNTER — Telehealth (INDEPENDENT_AMBULATORY_CARE_PROVIDER_SITE_OTHER): Payer: BC Managed Care – PPO | Admitting: Medical-Surgical

## 2021-06-25 ENCOUNTER — Encounter: Payer: Self-pay | Admitting: Medical-Surgical

## 2021-06-25 DIAGNOSIS — F411 Generalized anxiety disorder: Secondary | ICD-10-CM

## 2021-06-25 DIAGNOSIS — F902 Attention-deficit hyperactivity disorder, combined type: Secondary | ICD-10-CM

## 2021-06-25 DIAGNOSIS — Z713 Dietary counseling and surveillance: Secondary | ICD-10-CM | POA: Diagnosis not present

## 2021-06-25 MED ORDER — BUSPIRONE HCL 10 MG PO TABS: 10.0000 mg | ORAL_TABLET | Freq: Two times a day (BID) | ORAL | 1 refills | Status: DC

## 2021-06-25 MED ORDER — LISDEXAMFETAMINE DIMESYLATE 20 MG PO CAPS: 20.0000 mg | ORAL_CAPSULE | Freq: Every day | ORAL | 0 refills | Status: DC

## 2021-06-25 NOTE — Progress Notes (Signed)
Virtual Visit via Video Note  I connected with Ashlee Newton on 06/25/21 at  4:00 PM EST by a video enabled telemedicine application and verified that I am speaking with the correct person using two identifiers.   I discussed the limitations of evaluation and management by telemedicine and the availability of in person appointments. The patient expressed understanding and agreed to proceed.  Patient location: home Provider locations: office  Subjective:    CC: ADHD medication issue  HPI: Pleasant 26 year old female presenting today via MyChart video visit to discuss ADHD medication issues.  She previously tried Adderall but was unable to tolerate this due to grinding her teeth.  She has now been trying Metadate 30 mg daily but unfortunately this is causing severe teeth grinding as well.  Notes that she also grinds her teeth when she is anxious.  Recently had an ED visit because she dislocated her jaw from grinding her teeth so hard.  Is interested in other options to help manage her ADHD.  Feels that her anxiety has gotten worse and is causing worsening of her teeth grinding. Notes that her mom has some anxiety medication that she has tried and was helpful. Has taken Hydroxyzine at night to help with sleep but it makes her too drowsy to take during the day.   Is interested in options to help with weight loss. Has tried dietary modification and intentional exercise but has had no luck.   Past medical history, Surgical history, Family history not pertinant except as noted below, Social history, Allergies, and medications have been entered into the medical record, reviewed, and corrections made.   Review of Systems: See HPI for pertinent positives and negatives.   Objective:    General: Speaking clearly in complete sentences without any shortness of breath.  Alert and oriented x3.  Normal judgment. No apparent acute distress.  Impression and Recommendations:    1. Attention deficit  hyperactivity disorder (ADHD), combined type, moderate Discontinue Metadate. Adding to allergy/intolerance list. Start Vyvanse 20mg  daily. Advised that this may also result in similar side effects and if so, she may be limited to non-stimulant medications to treat her ADHD.   2. Encounter for weight loss counseling Sending a MyChart message with recommendations for help with weight loss. Including medications that may be beneficial to helping her lose weight but recommend regular intentional exercise along with a reduced calorie diet.    3. Anxiety state Start BuSpar 10mg  twice daily as needed. Recommend taking it scheduled as it will be more efficacious but can be used as needed if desired.  Discussed taking medications prescribed for other people, recommend avoiding this.   I discussed the assessment and treatment plan with the patient. The patient was provided an opportunity to ask questions and all were answered. The patient agreed with the plan and demonstrated an understanding of the instructions.   The patient was advised to call back or seek an in-person evaluation if the symptoms worsen or if the condition fails to improve as anticipated.  20 minutes of non-face-to-face time was provided during this encounter.  Return in about 4 weeks (around 07/23/2021) for ADHD/mood follow up.  , DNP, APRN, FNP-BC [facility]

## 2021-06-25 NOTE — Progress Notes (Signed)
Called at 4:00, call went straight to voicemail

## 2021-06-28 ENCOUNTER — Encounter: Payer: Self-pay | Admitting: Medical-Surgical

## 2021-06-29 MED ORDER — ATOMOXETINE HCL 40 MG PO CAPS: 40.0000 mg | ORAL_CAPSULE | Freq: Every day | ORAL | 0 refills | Status: DC

## 2021-06-29 NOTE — Telephone Encounter (Signed)
I contacted the pharmacy and canceled this prescription

## 2021-07-19 ENCOUNTER — Other Ambulatory Visit: Payer: Self-pay | Admitting: Medical-Surgical

## 2021-07-21 ENCOUNTER — Other Ambulatory Visit: Payer: Self-pay | Admitting: Medical-Surgical

## 2021-08-01 ENCOUNTER — Telehealth: Payer: Self-pay | Admitting: *Deleted

## 2021-08-01 NOTE — Telephone Encounter (Signed)
Left patient a message to call and schedule annual. 

## 2021-08-10 ENCOUNTER — Other Ambulatory Visit: Payer: Self-pay

## 2021-08-10 ENCOUNTER — Other Ambulatory Visit: Payer: Self-pay | Admitting: Medical-Surgical

## 2021-08-22 ENCOUNTER — Other Ambulatory Visit: Payer: Self-pay | Admitting: Medical-Surgical

## 2021-08-26 NOTE — Progress Notes (Signed)
? ?ANNUAL EXAM ?Patient name: Ashlee Newton MRN [id number]  Date of birth: 1996/02/12 ?Chief Complaint:   ?Annual Exam ? ?History of Present Illness:   ?Ashlee Newton is a 26 y.o. G0P0000 female being seen today for a routine annual exam.  ? ?Current complaints: notes right breast lump for a few months. Mom had imaging recently and had an excisional biopsy but was benign.  ? ?Pelvic pain from 6 months ago comes and goes. She was unable to find a time for PFPT. She is doing some home exercises.  ? ?No LMP recorded. (Menstrual status: IUD). ? ? ?The pregnancy intention screening data noted above was reviewed. Potential methods of contraception were discussed. The patient elected to proceed with No data recorded.  ? ?Last pap 05/2019. Results were: NILM w/ HRHPV not done. H/O abnormal pap: no ?Health Maintenance Due  ?Topic Date Due  ? COVID-19 Vaccine (2 - Pfizer series) 10/30/2020  ? ? ? ? ?  02/28/2021  ?  3:46 PM 01/09/2021  ?  2:48 PM 12/27/2020  ?  8:59 AM 09/21/2019  ?  7:57 AM 08/06/2018  ? 10:17 AM  ?Depression screen PHQ 2/9  ?Decreased Interest 1 0 0 1 0  ?Down, Depressed, Hopeless 1 0 0 2 0  ?PHQ - 2 Score 2 0 0 3 0  ?Altered sleeping 0   3 0  ?Tired, decreased energy 1   3 0  ?Change in appetite 0   3 0  ?Feeling bad or failure about yourself  1   2 0  ?Trouble concentrating 3   2 0  ?Moving slowly or fidgety/restless 0   1 0  ?Suicidal thoughts 0   1 0  ?PHQ-9 Score 7   18 0  ?Difficult doing work/chores Somewhat difficult   Not difficult at all Not difficult at all  ? ?  ? ?  02/28/2021  ?  3:48 PM 09/21/2019  ?  7:57 AM 08/06/2018  ? 10:17 AM  ?GAD 7 : Generalized Anxiety Score  ?Nervous, Anxious, on Edge 3 3 0  ?Control/stop worrying 2 3 0  ?Worry too much - different things 1 3 0  ?Trouble relaxing 2 3 0  ?Restless 3 3 0  ?Easily annoyed or irritable 2 3 0  ?Afraid - awful might happen 0 3 0  ?Total GAD 7 Score 13 21 0  ?Anxiety Difficulty Somewhat difficult Not difficult at all Not difficult at all   ? ? ? ?Review of Systems:   ?Pertinent items are noted in HPI ?Denies any headaches, blurred vision, fatigue, shortness of breath, chest pain, abdominal pain, abnormal vaginal discharge/itching/odor/irritation, problems with periods, bowel movements, urination, or intercourse unless otherwise stated above.  ?Pertinent History Reviewed:  ?Reviewed past medical,surgical, social and family history.  ?Reviewed problem list, medications and allergies. ?Physical Assessment:  ? ?Vitals:  ? 08/30/21 1305  ?BP: 116/80  ?Pulse: 90  ?Weight: 247 lb (112 kg)  ?Height: 5\' 4"  (1.626 m)  ?Body mass index is 42.4 kg/m?. ?  ?Physical Examination:  ?General appearance - well appearing, and in no distress ?Mental status - alert, oriented to person, place, and time ?Psych:  She has a normal mood and affect ?Skin - warm and dry, normal color, no suspicious lesions noted ?Chest - effort normal, all lung fields clear to auscultation bilaterally ?Heart - normal rate and regular rhythm ?Neck:  midline trachea, no thyromegaly or nodules ?Breasts - breasts appear normal, no suspicious masses, no skin or  nipple changes or axillary nodes. In right breast around the nipple feels like fibrous breast tissue, unable to feel discernible mass even with pt guidance, maybe a pea size lump, but difficult to feel.  ?Abdomen - soft, nontender, nondistended, no masses or organomegaly ?Pelvic -  ?VULVA: normal appearing vulva with no masses, tenderness or lesions   ?VAGINA: normal appearing vagina with normal color and discharge, no lesions   ?CERVIX: normal appearing cervix without discharge or lesions, no CMT, IUD strings visualized.  ?UTERUS: uterus is felt to be normal size, shape, consistency and nontender  ?ADNEXA: No adnexal masses or tenderness noted. ?Extremities:  No swelling or varicosities noted ? ?Chaperone present for exam ? ?No results found for this or any previous visit (from the past 24 hour(s)).  ?Assessment & Plan:  ?Jazma was seen  today for annual exam. ? ?Diagnoses and all orders for this visit: ? ?Encounter for annual routine gynecological examination ?-     Cytology - PAP( [facility]) ?- Cervical cancer screening: Discussed screening Q3 years. Reviewed importance of annual exams and limits of pap smear. Pap normal 05/2019 ?- GC/CT: Discussed and recommended. Pt  accepts ?- Gardasil: completed ?- Birth Control: IUD 05/2019 ?- Breast Health: Encouraged self breast awareness/exams. Teaching provided. ?- Follow-up: 12 months and prn ? ?Routine screening for STI (sexually transmitted infection) ?-     Cytology - PAP( [facility]) ? ?Mass of right breast, unspecified quadrant ?-     US BREAST LTD UNI RIGHT INC AXILLA; Future ? - Around the area of the nipple. Given persistence by pt SBE, would check [place].  ? ? ? ? ? ?Orders Placed This Encounter  ?Procedures  ? US BREAST LTD UNI RIGHT INC AXILLA  ? ? ?Meds: No orders of the defined types were placed in this encounter. ? ? ?Follow-up: Return for annual. ? ?Milas Hock, MD ?08/30/2021 ?1:39 PM ?

## 2021-08-30 ENCOUNTER — Other Ambulatory Visit (HOSPITAL_COMMUNITY)
Admission: RE | Admit: 2021-08-30 | Discharge: 2021-08-30 | Disposition: A | Discharge status: Home / Self Care | Payer: BC Managed Care – PPO | Source: Ambulatory Visit | Attending: Obstetrics and Gynecology | Admitting: Obstetrics and Gynecology

## 2021-08-30 ENCOUNTER — Ambulatory Visit (INDEPENDENT_AMBULATORY_CARE_PROVIDER_SITE_OTHER): Payer: BC Managed Care – PPO | Admitting: Obstetrics and Gynecology

## 2021-08-30 ENCOUNTER — Encounter: Payer: Self-pay | Admitting: Obstetrics and Gynecology

## 2021-08-30 VITALS — BP 116/80 | HR 90 | Ht 64.0 in | Wt 247.0 lb

## 2021-08-30 DIAGNOSIS — Z01419 Encounter for gynecological examination (general) (routine) without abnormal findings: Secondary | ICD-10-CM | POA: Insufficient documentation

## 2021-08-30 DIAGNOSIS — Z113 Encounter for screening for infections with a predominantly sexual mode of transmission: Secondary | ICD-10-CM | POA: Diagnosis not present

## 2021-08-30 DIAGNOSIS — N6310 Unspecified lump in the right breast, unspecified quadrant: Secondary | ICD-10-CM | POA: Diagnosis not present

## 2021-08-30 NOTE — Progress Notes (Signed)
Pt noticed right breast lump ?

## 2021-09-03 LAB — CYTOLOGY - PAP
Chlamydia: NEGATIVE
Comment: NEGATIVE
Comment: NORMAL
Diagnosis: NEGATIVE
Neisseria Gonorrhea: NEGATIVE

## 2021-09-04 ENCOUNTER — Emergency Department
Admission: RE | Admit: 2021-09-04 | Discharge: 2021-09-04 | Disposition: A | Discharge status: Home / Self Care | Payer: BC Managed Care – PPO | Source: Ambulatory Visit | Attending: Family Medicine | Admitting: Family Medicine

## 2021-09-04 VITALS — BP 120/87 | HR 111 | Temp 99.4°F | Resp 18 | Ht 64.0 in | Wt 247.0 lb

## 2021-09-04 DIAGNOSIS — U071 COVID-19: Secondary | ICD-10-CM | POA: Diagnosis not present

## 2021-09-04 LAB — POC SARS CORONAVIRUS 2 AG -  ED: SARS Coronavirus 2 Ag: POSITIVE — AB

## 2021-09-04 LAB — POCT RAPID STREP A (OFFICE): Rapid Strep A Screen: NEGATIVE

## 2021-09-04 MED ORDER — NIRMATRELVIR/RITONAVIR (PAXLOVID)TABLET: 3.0000 | ORAL_TABLET | Freq: Two times a day (BID) | ORAL | 0 refills | Status: AC

## 2021-09-04 NOTE — ED Provider Notes (Addendum)
?[facility] ? ? ? ?CSN: [id number] ?Arrival date & time: 09/04/21  4098 ? ? ?  ? ?History   ?Chief Complaint ?Chief Complaint  ?Patient presents with  ? Chills  ?  flu like symptoms - Entered by patient  ? Appointment  ? ? ?HPI ?Ashlee Newton is a 26 y.o. female.  ? ?HPI ? ?26 year old woman with asthma and allergies who is here for an upper respiratory infection that is been going on for 3 days.  She has chills and headache, productive cough sore throat and runny nose for 3 days.  Her ears have been popping and painful.  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  She states she has a painful sore throat.  She is vaccinated for COVID and for influenza.  No known exposure to illness, however, there was a death in her family and she attended family gatherings. ? ?Past Medical History:  ?Diagnosis Date  ? Allergies   ? Allergy   ? Attention deficit hyperactivity disorder (ADHD), combined type, moderate 12/15/2020  ? Generalized anxiety disorder 12/15/2020  ? Head pain   ? Major depressive disorder, recurrent episode, severe (HCC) 12/15/2020  ? Migraine   ? Psoriasis   ? Psoriatic arthritis (HCC)   ? ? ?Patient Active Problem List  ? Diagnosis Date Noted  ? Memory loss 03/19/2021  ? Major depressive disorder, recurrent episode, severe (HCC) 12/15/2020  ? Generalized anxiety disorder 12/15/2020  ? Attention deficit hyperactivity disorder (ADHD), combined type, moderate 12/15/2020  ? Chronic migraine w/o aura w/o status migrainosus, not intractable 04/18/2020  ? Status post labral repair of shoulder 10/28/2019  ? Glenoid labral tear, right, subsequent encounter 09/29/2019  ? 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 Seasonal allergies 07/04/2011  ? Irregular menses 07/04/2011  ? Headache(784.0) 07/04/2011  ? ? ?Past Surgical History:  ?Procedure Laterality Date  ? labrial repair    ? TONSILLECTOMY AND ADENOIDECTOMY    ? TYMPANOSTOMY TUBE PLACEMENT    ? ? ?OB History    ? ? Gravida  ?0  ? Para  ?0  ? Term  ?0  ? Preterm  ?0  ? AB  ?0  ? Living  ?0  ?  ? ? SAB  ?0  ? IAB  ?0  ? Ectopic  ?0  ? Multiple  ?0  ? Live Births  ?0  ?   ?  ?  ? ? ? ?Home Medications   ? ?Prior to Admission medications   ?Medication Sig Start Date End Date Taking? Authorizing Provider  ?Erenumab-aooe (AIMOVIG) 70 MG/ML SOAJ Inject 70 mg into the skin every 30 (thirty) days. 03/19/21  Yes Levert Feinstein, MD  ?HUMIRA PEN 40 MG/0.4ML PNKT SMARTSIG:40 Milligram(s) SUB-Q Every 2 Weeks 08/17/20  Yes [provider]  ?levocetirizine (XYZAL) 5 MG tablet Take by mouth.   Yes [provider]  ?levonorgestrel (MIRENA) 20 MCG/24HR IUD 1 each by Intrauterine route once. Mirena IUD placed 06/25/2019   Yes [provider]  ?montelukast (SINGULAIR) 10 MG tablet Take by mouth.   Yes [provider]  ?nirmatrelvir/ritonavir EUA (PAXLOVID) 20 x 150 MG & 10 x 100MG  TABS Take 3 tablets by mouth 2 (two) times daily for 5 days. Patient GFR is normal.  Take nirmatrelvir (150 mg) two tablets twice daily for 5 days and ritonavir (100 mg) one tablet twice daily for 5 days. 09/04/21 09/09/21 Yes 09/11/21, MD  ?  spironolactone (ALDACTONE) 50 MG tablet Take 50 mg by mouth daily. 05/25/20  Yes [provider]  ? ? ?Family History ?Family History  ?Problem Relation Age of Onset  ? Migraines Mother   ? Hypercholesterolemia Mother   ? Hypertension Father   ? Cancer Maternal Grandfather   ?     pancreatitis --> pancreatic CA  ? ? ?Social History ?Social History  ? ?Tobacco Use  ? Smoking status: Never  ? Smokeless tobacco: Never  ?Vaping Use  ? Vaping Use: Never used  ?Substance Use Topics  ? Alcohol use: Not Currently  ? Drug use: Never  ? ? ? ?Allergies   ?Contrast media [iodinated contrast media] and Cholestatin ? ? ?Review of Systems ?Review of Systems ?See HPI ? ?Physical Exam ?Triage Vital Signs ?ED Triage Vitals  ?Enc Vitals Group  ?   BP 09/04/21 0905 120/87  ?   Pulse Rate 09/04/21 0905  (!) 111  ?   Resp 09/04/21 0905 18  ?   Temp 09/04/21 0905 99.4 ?F (37.4 ?C)  ?   Temp Source 09/04/21 0905 Oral  ?   SpO2 09/04/21 0905 95 %  ?   Weight 09/04/21 0906 247 lb (112 kg)  ?   Height 09/04/21 0906 5\' 4"  (1.626 m)  ?   Head Circumference --   ?   Peak Flow --   ?   Pain Score 09/04/21 0906 7  ?   Pain Loc --   ?   Pain Edu? --   ?   Excl. in GC? --   ? ?No data found. ? ?Updated Vital Signs ?BP 120/87 (BP Location: Left Arm)   Pulse (!) 111   Temp 99.4 ?F (37.4 ?C) (Oral)   Resp 18   Ht 5\' 4"  (1.626 m)   Wt 112 kg   SpO2 95%   BMI 42.40 kg/m?  ?   ? ?Physical Exam ?Constitutional:   ?   General: She is not in acute distress. ?   Appearance: She is well-developed. She is obese. She is ill-appearing.  ?HENT:  ?   Head: Normocephalic and atraumatic.  ?   Right Ear: Tympanic membrane and ear canal normal.  ?   Left Ear: Tympanic membrane and ear canal normal.  ?   Nose: Congestion and rhinorrhea present.  ?   Mouth/Throat:  ?   Pharynx: Posterior oropharyngeal erythema present.  ?Eyes:  ?   Conjunctiva/sclera: Conjunctivae normal.  ?   Pupils: Pupils are equal, round, and reactive to light.  ?Cardiovascular:  ?   Rate and Rhythm: Normal rate and regular rhythm.  ?   Heart sounds: Normal heart sounds.  ?Pulmonary:  ?   Effort: Pulmonary effort is normal. No respiratory distress.  ?   Breath sounds: Normal breath sounds. No wheezing or rhonchi.  ?Abdominal:  ?   General: There is no distension.  ?   Palpations: Abdomen is soft.  ?Musculoskeletal:     ?   General: Normal range of motion.  ?   Cervical back: Normal range of motion and neck supple.  ?Lymphadenopathy:  ?   Cervical: No cervical adenopathy.  ?Skin: ?   General: Skin is warm and dry.  ?Neurological:  ?   Mental Status: She is alert.  ?Psychiatric:     ?   Mood and Affect: Mood normal.     ?   Behavior: Behavior normal.  ? ? ? ?UC Treatments / Results  ?Labs ?(all labs  ordered are listed, but only abnormal results are displayed) ?Labs Reviewed   ?POC SARS CORONAVIRUS 2 AG -  ED - Abnormal; Notable for the following components:  ?    Result Value  ? SARS Coronavirus 2 Ag Positive (*)   ? All other components within normal limits  ?POCT RAPID STREP A (OFFICE)  ?Rapid strep test is negative ?Throat culture is pending ?COVID test is performed and is POSITIVE ? ?EKG ? ? ?Radiology ?No results found. ? ?Procedures ?Procedures (including critical care time) ? ?Medications Ordered in UC ?Medications - No data to display ? ?Initial Impression / Assessment and Plan / UC Course  ?I have reviewed the triage vital signs and the nursing notes. ? ?Pertinent labs & imaging results that were available during my care of the patient were reviewed by me and considered in my medical decision making (see chart for details). ? ?  ? ?Discussed COVID treatment and quarantine.  I am choosing to treat her with Paxlovid because of her medical conditions, Humira injections, obesity, and to help reduce her symptoms. ?Final Clinical Impressions(s) / UC Diagnoses  ? ?Final diagnoses:  ?COVID-19  ? ? ? ?Discharge Instructions   ? ?  ?Take Paxlovid 2 times a day for 5 full days ?You must quarantine at home for the first 5-day for the illness, then wear a mask for the next 5 days for total of 10 days of protection ?Call health at work when you get home to notify them of your COVID positive status ? ? ? ? ?ED Prescriptions   ? ? Medication Sig Dispense Auth. Provider  ? nirmatrelvir/ritonavir EUA (PAXLOVID) 20 x 150 MG & 10 x 100MG  TABS Take 3 tablets by mouth 2 (two) times daily for 5 days. Patient GFR is normal.  Take nirmatrelvir (150 mg) two tablets twice daily for 5 days and ritonavir (100 mg) one tablet twice daily for 5 days. 30 tablet Eustace MooreNelson, Hamlin Devine Sue, MD  ? ?  ? ?PDMP not reviewed this encounter. ?  ?Eustace MooreNelson, Necie Wilcoxson Sue, MD ?09/04/21 [phone] ? ?  ?Eustace MooreNelson, Derward Marple Sue, MD ?09/04/21 1001 ? ?

## 2021-09-04 NOTE — Discharge Instructions (Addendum)
Take Paxlovid 2 times a day for 5 full days ?You must quarantine at home for the first 5-day for the illness, then wear a mask for the next 5 days for total of 10 days of protection ?Call health at work when you get home to notify them of your COVID positive status ?

## 2021-09-04 NOTE — ED Triage Notes (Signed)
Patient c/o body aches, chills, productive cough, sore throat and bilateral ear pain x 3 days.  Patient has been taken Tylenol for discomfort. ?

## 2021-09-07 ENCOUNTER — Other Ambulatory Visit: Payer: Self-pay | Admitting: Medical-Surgical

## 2021-09-20 ENCOUNTER — Other Ambulatory Visit: Payer: Self-pay | Admitting: Medical-Surgical

## 2021-09-23 NOTE — Progress Notes (Signed)
?  HPI with pertinent ROS:  ? ?CC: travel questions about medications ? 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 She will be going to several cities including [place] and will be there for a month. She and a friend will also be staying for another 4 weeks to tour various parts of [place]. She is leaving Monday next week. For travel purposes, she is requesting a letter stating what medications she takes and the indication for each.  ? ?Stopped taking Strattera because it caused sedation. Is interested in possibly starting a combination of Wellbutrin and Intuniv since this has worked very well for a co-worker who also suffers from ADHD. Would like to try it but not until she comes back from her [place] trip.  ? ?I reviewed the past medical history, family history, social history, surgical history, and allergies today and no changes were needed.  Please see the problem list section below in epic for further details. ? ?Physical exam:  ? ?General: Well Developed, well nourished, and in no acute distress.  ?Neuro: Alert and oriented x3.  ?HEENT: Normocephalic, atraumatic.  ?Skin: Warm and dry. ?Cardiac: Regular rate and rhythm, no murmurs rubs or gallops, no lower extremity edema.  ?Respiratory: Clear to auscultation bilaterally. Not using accessory muscles, speaking in full sentences. ? ?Impression and Recommendations:   ? ?1. Counseling for travel ?Letter provided to patient with list of medications and indications as requested. Paper copy and sent to MyChart.  ? ?2. Attention deficit hyperactivity disorder (ADHD), combined type, moderate ?Plan to start Wellbutrin/Intuniv when she returns. She will message me when she gets back to get the medication sent in and then follow up in person/virtually after about 4 weeks.  ? ?3. Anxiety state ?Refilling BuSpar 10mg  BID prn.  ? ?4. Acne vulgaris ?Refilling Spironolactone 150mg  daily.  ? ?Return in  about 3 months (around 12/25/2021) for ADHD follow up. ?___________________________________________ ? , DNP, APRN, FNP-BC ?Primary Care and [facility] ? [facility] 02/24/2022 ?

## 2021-09-24 ENCOUNTER — Encounter: Payer: Self-pay | Admitting: Medical-Surgical

## 2021-09-24 ENCOUNTER — Ambulatory Visit: Payer: BC Managed Care – PPO | Admitting: Medical-Surgical

## 2021-09-24 ENCOUNTER — Ambulatory Visit
Admission: RE | Admit: 2021-09-24 | Discharge: 2021-09-24 | Disposition: A | Discharge status: Home / Self Care | Payer: BC Managed Care – PPO | Source: Ambulatory Visit | Attending: Obstetrics and Gynecology | Admitting: Obstetrics and Gynecology

## 2021-09-24 VITALS — BP 122/84 | HR 92 | Resp 20 | Ht 64.0 in | Wt 241.9 lb

## 2021-09-24 DIAGNOSIS — Z7184 Encounter for health counseling related to travel: Secondary | ICD-10-CM | POA: Diagnosis not present

## 2021-09-24 DIAGNOSIS — L700 Acne vulgaris: Secondary | ICD-10-CM | POA: Diagnosis not present

## 2021-09-24 DIAGNOSIS — N6310 Unspecified lump in the right breast, unspecified quadrant: Secondary | ICD-10-CM

## 2021-09-24 DIAGNOSIS — F902 Attention-deficit hyperactivity disorder, combined type: Secondary | ICD-10-CM

## 2021-09-24 DIAGNOSIS — F411 Generalized anxiety disorder: Secondary | ICD-10-CM

## 2021-09-24 DIAGNOSIS — N6489 Other specified disorders of breast: Secondary | ICD-10-CM | POA: Diagnosis not present

## 2021-09-24 IMAGING — US US BREAST*R* LIMITED INC AXILLA
1 series · 3 of 3 positions shown · non-contrast
Comparison: None.

CLINICAL DATA: 25-year-old female presenting for evaluation of a
new lump in the right breast for approximately 3 months. No strong
family history of breast cancer.

EXAM:
ULTRASOUND OF THE RIGHT BREAST

[Series 1: us breast*right* limited inc axilla · 0.08mm/px · 3 of 3 slices shown]
[im 1/3]
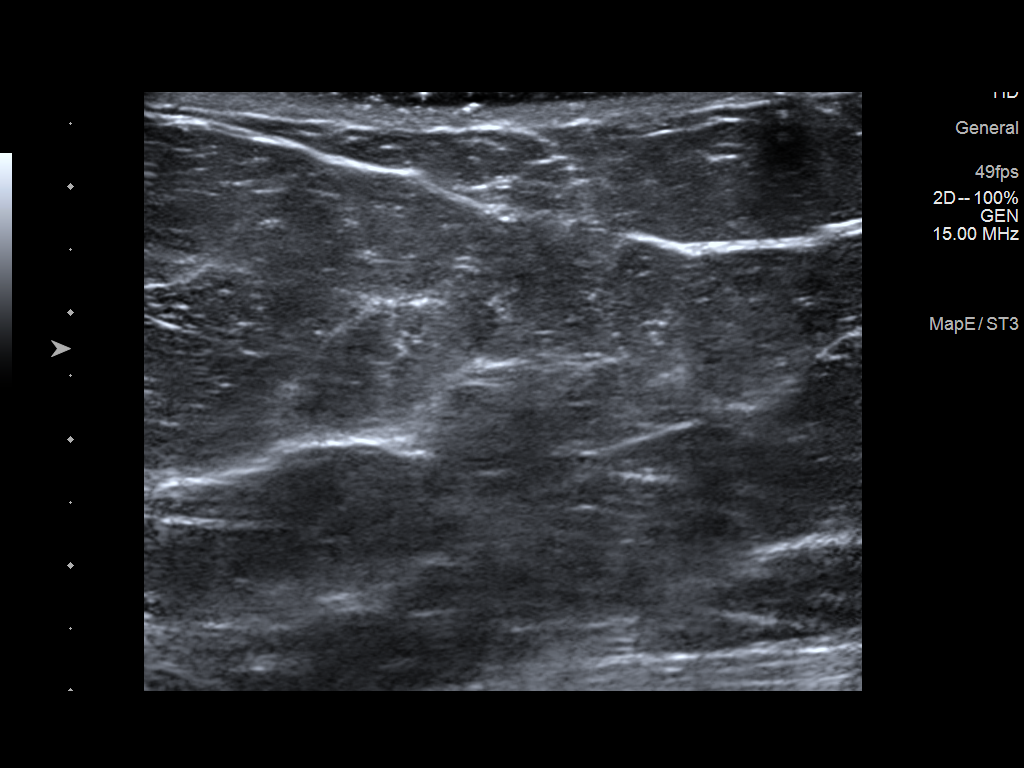
[im 2/3]
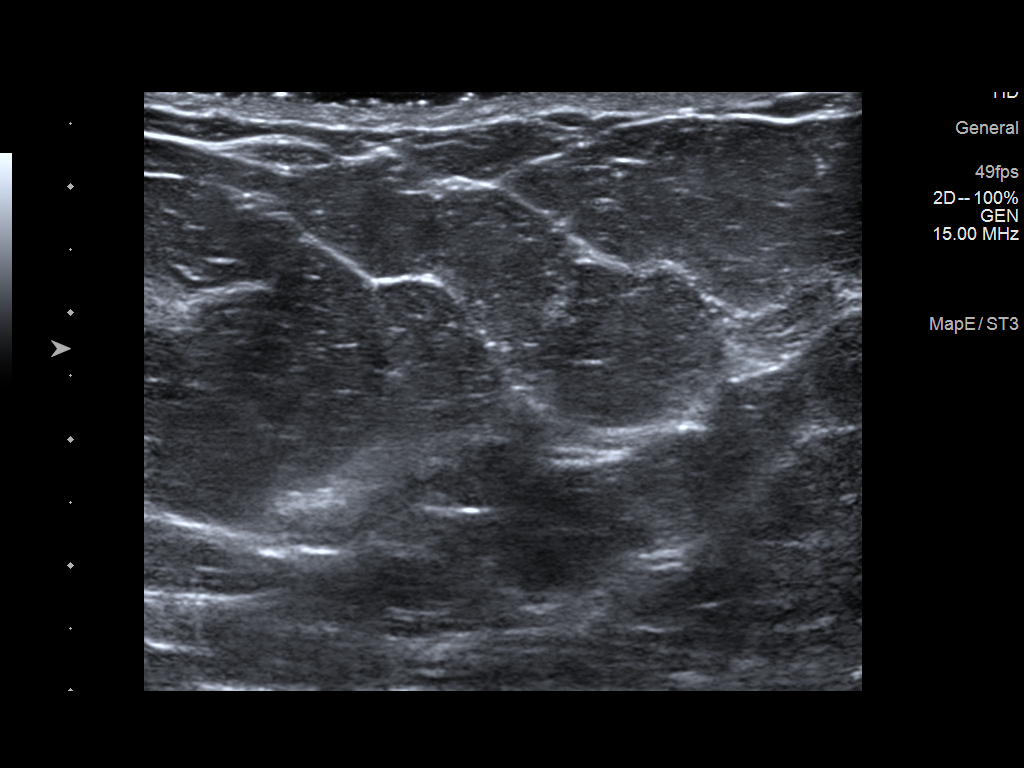
[im 3/3]
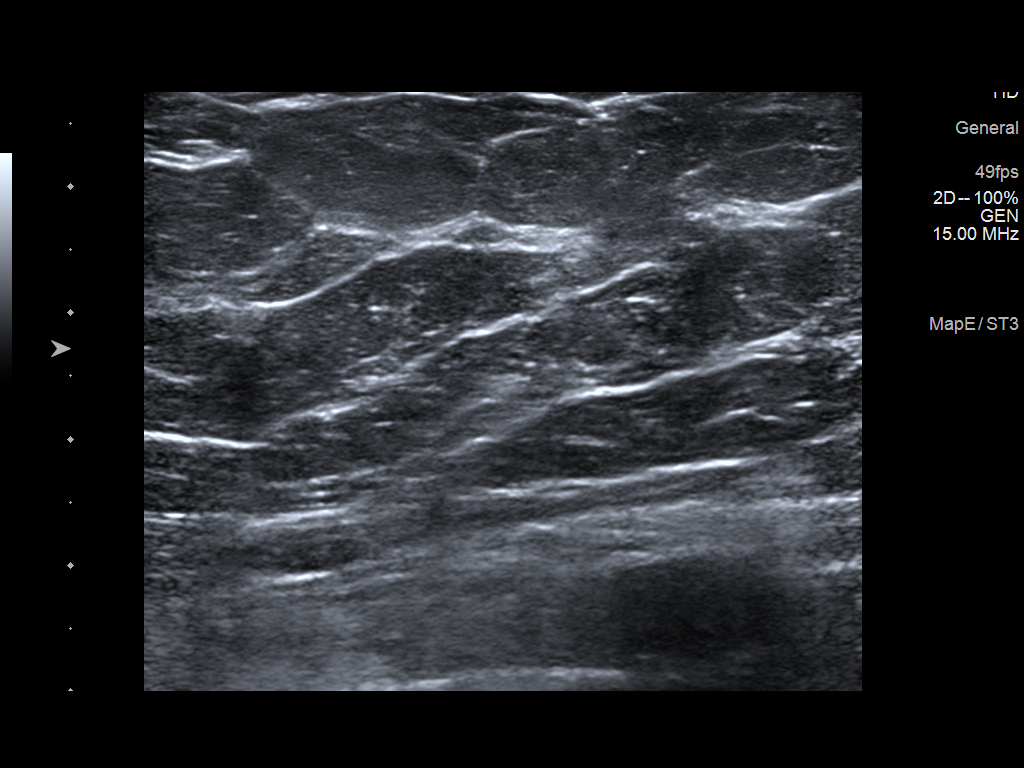

[3 of 3 positions shown; findings below may reference images not displayed]

FINDINGS: On physical exam, at the site of concern reported by the patient in
the medial right breast I do not feel a discrete fixed mass or focal
area of thickening.

Targeted ultrasound is performed at the palpable site of concern in
the right breast at 3-4 o'clock 3 cm from the nipple demonstrating
no cystic or solid mass.
IMPRESSION: No sonographic evidence of malignancy at the palpable site of
concern in the right breast.

RECOMMENDATION:
1. Recommend any further workup of the palpable site in the right
breast be on a clinical basis.

2. Continue routine annual clinical breast exams and begin screening
mammography at age 40.

I have discussed the findings and recommendations with the patient.
If applicable, a reminder letter will be sent to the patient
regarding the next appointment.

BI-RADS CATEGORY  1: Negative.

## 2021-09-24 MED ORDER — BUSPIRONE HCL 10 MG PO TABS: 10.0000 mg | ORAL_TABLET | Freq: Two times a day (BID) | ORAL | 0 refills | Status: DC

## 2021-09-24 MED ORDER — SPIRONOLACTONE 50 MG PO TABS: 150.0000 mg | ORAL_TABLET | Freq: Every day | ORAL | 0 refills | Status: DC

## 2021-11-20 ENCOUNTER — Encounter: Payer: Self-pay | Admitting: Medical-Surgical

## 2021-11-21 ENCOUNTER — Other Ambulatory Visit: Payer: Self-pay | Admitting: Medical-Surgical

## 2021-11-21 DIAGNOSIS — F902 Attention-deficit hyperactivity disorder, combined type: Secondary | ICD-10-CM

## 2021-11-21 MED ORDER — BUPROPION HCL ER (XL) 150 MG PO TB24: 150.0000 mg | ORAL_TABLET | ORAL | 0 refills | Status: DC

## 2021-11-21 MED ORDER — GUANFACINE HCL ER 1 MG PO TB24: 1.0000 mg | ORAL_TABLET | Freq: Every day | ORAL | 0 refills | Status: DC

## 2021-12-06 ENCOUNTER — Telehealth: Payer: Self-pay | Admitting: Medical-Surgical

## 2021-12-06 DIAGNOSIS — Z111 Encounter for screening for respiratory tuberculosis: Secondary | ICD-10-CM

## 2021-12-06 NOTE — Telephone Encounter (Signed)
I sent this message to [facility] in error.

## 2021-12-06 NOTE — Telephone Encounter (Signed)
Patient had a QuantiFERON test on 12/29/20. Is results within a valid time frame or expired?

## 2021-12-06 NOTE — Telephone Encounter (Signed)
Pt called. She needs a Haematologist for school.

## 2021-12-07 DIAGNOSIS — Z111 Encounter for screening for respiratory tuberculosis: Secondary | ICD-10-CM | POA: Diagnosis not present

## 2021-12-07 NOTE — Telephone Encounter (Signed)
Task completed. Per provider's request, QuantiFERON test ordered. Patient has been informed of lab order and lab schedule/hours. No other inquiries during the call.

## 2021-12-11 LAB — QUANTIFERON-TB GOLD PLUS
Mitogen-NIL: >10 IU/mL
NIL: 0.07 IU/mL
QuantiFERON-TB Gold Plus: NEGATIVE
TB1-NIL: <0 IU/mL
TB2-NIL: <0 IU/mL

## 2021-12-19 DIAGNOSIS — L409 Psoriasis, unspecified: Secondary | ICD-10-CM | POA: Diagnosis not present

## 2021-12-19 DIAGNOSIS — L4059 Other psoriatic arthropathy: Secondary | ICD-10-CM | POA: Diagnosis not present

## 2021-12-20 NOTE — Progress Notes (Signed)
Virtual Visit via Video Note  I connected with Ashlee Newton on 12/21/21 at 10:10 AM EDT by a video enabled telemedicine application and verified that I am speaking with the correct person using two identifiers.   I discussed the limitations of evaluation and management by telemedicine and the availability of in person appointments. The patient expressed understanding and agreed to proceed.  Patient location: home Provider locations: office  Subjective:    CC: ADHD follow-up  HPI: Pleasant 26 year old female presenting today to follow-up on ADHD.  She has been taking guanfacine 1 mg daily along with Wellbutrin 150 mg daily, tolerating well without side effects. Has not had any of the symptoms she  had with stimulant medications (teeth grinding, overly sleepy, brain fog) but feels that she is still hyper in the evenings and scatterbrained. Is interested in continuing the medication but would like an increased dose.   Past medical history, Surgical history, Family history not pertinant except as noted below, Social history, Allergies, and medications have been entered into the medical record, reviewed, and corrections made.   Review of Systems: See HPI for pertinent positives and negatives.      12/21/2021   10:28 AM 12/21/2021   10:18 AM 09/24/2021   11:52 AM 02/28/2021    3:46 PM 01/09/2021    2:48 PM  Depression screen PHQ 2/9  Decreased Interest 0  1 1 0  Down, Depressed, Hopeless 0  1 1 0  PHQ - 2 Score 0  2 2 0  Altered sleeping 2  2 0   Tired, decreased energy 1  2 1    Change in appetite 1 1 2  0   Feeling bad or failure about yourself  1  2 1    Trouble concentrating 1 1 3 3    Moving slowly or fidgety/restless 1  0 0   Suicidal thoughts 1  1 0   PHQ-9 Score 8  14 7    Difficult doing work/chores Somewhat difficult  Somewhat difficult Somewhat difficult       12/21/2021   10:33 AM 09/24/2021   11:53 AM 02/28/2021    3:48 PM 09/21/2019    7:57 AM  GAD 7 : Generalized Anxiety  Score  Nervous, Anxious, on Edge 2 1 3 3   Control/stop worrying 1 1 2 3   Worry too much - different things 1 2 1 3   Trouble relaxing 1 1 2 3   Restless 2 1 3 3   Easily annoyed or irritable 2 2 2 3   Afraid - awful might happen 0 1 0 3  Total GAD 7 Score 9 9 13 21   Anxiety Difficulty Somewhat difficult Somewhat difficult Somewhat difficult Not difficult at all   Objective:    General: Speaking clearly in complete sentences without any shortness of breath.  Alert and oriented x3.  Normal judgment. No apparent acute distress.  Impression and Recommendations:    1. Attention deficit hyperactivity disorder (ADHD), combined type, moderate She is tolerating the medications well which is much better than she did with stimulants.  Increasing guanfacine to 2 mg daily and Wellbutrin to 300 mg daily.   I discussed the assessment and treatment plan with the patient. The patient was provided an opportunity to ask questions and all were answered. The patient agreed with the plan and demonstrated an understanding of the instructions.   The patient was advised to call back or seek an in-person evaluation if the symptoms worsen or if the condition fails to improve as anticipated.  25 minutes of non-face-to-face time was provided during this encounter.  Return in about 6 weeks (around 02/01/2022) for ADHD follow up.  Thayer Ohm, DNP, APRN, FNP-BC [facility] and [facility]

## 2021-12-21 ENCOUNTER — Telehealth (INDEPENDENT_AMBULATORY_CARE_PROVIDER_SITE_OTHER): Payer: BC Managed Care – PPO | Admitting: Medical-Surgical

## 2021-12-21 ENCOUNTER — Encounter: Payer: Self-pay | Admitting: Medical-Surgical

## 2021-12-21 ENCOUNTER — Other Ambulatory Visit: Payer: Self-pay | Admitting: Medical-Surgical

## 2021-12-21 DIAGNOSIS — F902 Attention-deficit hyperactivity disorder, combined type: Secondary | ICD-10-CM

## 2021-12-21 MED ORDER — GUANFACINE HCL ER 2 MG PO TB24: 2.0000 mg | ORAL_TABLET | Freq: Every day | ORAL | 1 refills | Status: DC

## 2021-12-21 MED ORDER — BUPROPION HCL ER (XL) 300 MG PO TB24: 300.0000 mg | ORAL_TABLET | ORAL | 1 refills | Status: DC

## 2021-12-28 ENCOUNTER — Encounter: Payer: Self-pay | Admitting: Medical-Surgical

## 2022-01-18 ENCOUNTER — Other Ambulatory Visit: Payer: Self-pay | Admitting: Medical-Surgical

## 2022-02-01 ENCOUNTER — Other Ambulatory Visit: Payer: Self-pay | Admitting: Medical-Surgical

## 2022-02-05 NOTE — Telephone Encounter (Signed)
This is a trial medication. 90 day supply not indicated until follow up as scheduled on 9/21

## 2022-02-11 ENCOUNTER — Telehealth: Payer: BC Managed Care – PPO | Admitting: Medical-Surgical

## 2022-02-14 ENCOUNTER — Encounter: Payer: Self-pay | Admitting: Medical-Surgical

## 2022-02-14 ENCOUNTER — Telehealth (INDEPENDENT_AMBULATORY_CARE_PROVIDER_SITE_OTHER): Payer: BC Managed Care – PPO | Admitting: Medical-Surgical

## 2022-02-14 DIAGNOSIS — F902 Attention-deficit hyperactivity disorder, combined type: Secondary | ICD-10-CM | POA: Diagnosis not present

## 2022-02-14 NOTE — Progress Notes (Signed)
Virtual Visit via Video Note  I connected with Ashlee Newton on 02/14/22 at  2:00 PM EDT by a video enabled telemedicine application and verified that I am speaking with the correct person using two identifiers.   I discussed the limitations of evaluation and management by telemedicine and the availability of in person appointments. The patient expressed understanding and agreed to proceed.  Patient location: home Provider locations: office  Subjective:    CC: ADHD follow-up  HPI: Pleasant 26 year old female presenting via MyChart video visit to follow-up on ADHD.  She has been taking Intuniv 2 mg daily with Wellbutrin 300 mg daily.  Reports that the medication does somewhat help with her symptoms and helps her focus however she feels that her anxiety has increased.  She has also been grinding her teeth and still experiences the 3:00 afternoon headaches similar to when she was taking stimulants.  Notes that she previously wanted to try Vyvanse however her insurance did not cover it and it would have been cost prohibitive.  She is interested in switching to the generic Vyvanse once it is available.  Does admit to passive suicidal ideation but has absolutely no plan to harm herself.  Reports that this is more often in the context of if she never woke up she would be okay with that.  Past medical history, Surgical history, Family history not pertinant except as noted below, Social history, Allergies, and medications have been entered into the medical record, reviewed, and corrections made.   Review of Systems: See HPI for pertinent positives and negatives.   Objective:    General: Speaking clearly in complete sentences without any shortness of breath.  Alert and oriented x3.  Normal judgment. No apparent acute distress.  Impression and Recommendations:    1. Attention deficit hyperactivity disorder (ADHD), combined type, moderate Unfortunately, this medication does not seem to be working  extremely well for her and seems to have some side effects that are affecting her mental health.  Discussed the nature of Wellbutrin and how this can increase anxiety however she would like to continue the medication at her current 300 mg daily dose.  Discussed the use of Intuniv.  She is only on 2 mg daily so we do have room for an increase in her dose but this may be contributing to her worsened mental health symptoms.  She wants to try increasing it so we will go up to 3 mg daily on this but advised her to monitor for worsening of her symptoms and let me know immediately should this happen.  In a few months, plan to try getting generic Vyvanse for her to see if this is better tolerated.  I discussed the assessment and treatment plan with the patient. The patient was provided an opportunity to ask questions and all were answered. The patient agreed with the plan and demonstrated an understanding of the instructions.   The patient was advised to call back or seek an in-person evaluation if the symptoms worsen or if the condition fails to improve as anticipated.  25 minutes of non-face-to-face time was provided during this encounter.  Return in about 3 months (around 05/16/2022) for ADHD/mood follow-up.  I will send her a MyChart message in about 4 weeks to see how she is doing on the increased dose of Intuniv.  Advised her to let me know should she have any questions or concerns in the meantime.  Thayer Ohm, DNP, APRN, FNP-BC [place] [facility]  and [facility] Medicine

## 2022-02-15 ENCOUNTER — Encounter: Payer: Self-pay | Admitting: Medical-Surgical

## 2022-02-15 MED ORDER — LISDEXAMFETAMINE DIMESYLATE 20 MG PO CAPS: 20.0000 mg | ORAL_CAPSULE | ORAL | 0 refills | Status: DC

## 2022-02-16 ENCOUNTER — Telehealth: Payer: Self-pay

## 2022-02-16 NOTE — Telephone Encounter (Signed)
PA initiated for Vyvanse 20mg . Drug Lisdexamfetamine Dimesylate 20MG  capsules Form Blue Building control surveyor Form (CB) Original Claim Info 75 PA REQUIRED--QUANTITY LIMIT MAY APPLY

## 2022-02-26 ENCOUNTER — Other Ambulatory Visit: Payer: Self-pay | Admitting: Medical-Surgical

## 2022-03-07 ENCOUNTER — Encounter: Payer: Self-pay | Admitting: Medical-Surgical

## 2022-03-08 ENCOUNTER — Telehealth: Payer: Self-pay

## 2022-03-08 MED ORDER — AIMOVIG 70 MG/ML ~~LOC~~ SOAJ: 70.0000 mg | SUBCUTANEOUS | 3 refills | Status: DC

## 2022-03-08 NOTE — Addendum Note (Signed)
Addended bySamuel Bouche on: 03/08/2022 06:17 PM   Modules accepted: Orders

## 2022-03-08 NOTE — Telephone Encounter (Signed)
Initiated Prior authorization [id number] 20MG  capsules Via: Covermymeds Case/Key:[id number] Status: approved  as of 03/08/22 Reason:Effective from 03/08/2022 through 03/07/2023. Notified Pt via: Mychart

## 2022-03-14 ENCOUNTER — Encounter: Payer: Self-pay | Admitting: Medical-Surgical

## 2022-03-14 ENCOUNTER — Telehealth: Payer: Self-pay

## 2022-03-14 MED ORDER — GUANFACINE HCL ER 3 MG PO TB24
3.0000 mg | ORAL_TABLET | Freq: Every day | ORAL | 1 refills | Status: DC
Start: 2022-03-14 — End: 2022-04-20

## 2022-03-14 MED ORDER — BUPROPION HCL ER (XL) 150 MG PO TB24
150.0000 mg | ORAL_TABLET | ORAL | 1 refills | Status: DC
Start: 2022-03-14 — End: 2022-04-20

## 2022-03-14 NOTE — Telephone Encounter (Addendum)
Initiated Prior authorization [id number] 70MG /ML auto-injectors Via: Covermymeds Case/Key:[id number] Status: approved  as of 03/14/22 Reason:Effective from 03/14/2022 through 06/05/2022.  Notified Pt via: Mychart

## 2022-04-20 ENCOUNTER — Ambulatory Visit
Admission: EM | Admit: 2022-04-20 | Discharge: 2022-04-20 | Disposition: A | Discharge status: Home / Self Care | Payer: BC Managed Care – PPO | Attending: Family Medicine | Admitting: Family Medicine

## 2022-04-20 DIAGNOSIS — B9689 Other specified bacterial agents as the cause of diseases classified elsewhere: Secondary | ICD-10-CM | POA: Diagnosis not present

## 2022-04-20 DIAGNOSIS — J019 Acute sinusitis, unspecified: Secondary | ICD-10-CM

## 2022-04-20 DIAGNOSIS — J3089 Other allergic rhinitis: Secondary | ICD-10-CM

## 2022-04-20 MED ORDER — AMOXICILLIN-POT CLAVULANATE 875-125 MG PO TABS
1.0000 | ORAL_TABLET | Freq: Two times a day (BID) | ORAL | 0 refills | Status: DC
Start: 2022-04-20 — End: 2022-06-17

## 2022-04-20 MED ORDER — PREDNISONE 20 MG PO TABS: ORAL_TABLET | ORAL | 0 refills | Status: DC

## 2022-04-20 MED ORDER — FLUCONAZOLE 150 MG PO TABS: 150.0000 mg | ORAL_TABLET | Freq: Every day | ORAL | 0 refills | Status: DC

## 2022-04-20 NOTE — ED Provider Notes (Signed)
[facility]    CSN: [id number] Arrival date & time: 04/20/22  0813      History   Chief Complaint No chief complaint on file.   HPI Ashlee Newton is a 26 y.o. female.   HPI  Patient is here for an upper respiratory infection.  Is been going on for a month.  At first she thought it was allergies.  Then she developed a lot of sputum production and thought it was a virus.  She has been using her usual allergy medicine.  Also some over-the-counter cough and cold medicine.  After a month, she is concerned she may have a sinus infection.  Is here for evaluation.  Is having trouble breathing through her nose due to sinus congestion  Past Medical History:  Diagnosis Date   Allergies    Allergy    Attention deficit hyperactivity disorder (ADHD), combined type, moderate 12/15/2020   Generalized anxiety disorder 12/15/2020   Head pain    Major depressive disorder, recurrent episode, severe (HCC) 12/15/2020   Migraine    Psoriasis    Psoriatic arthritis (HCC)     Patient Active Problem List   Diagnosis Date Noted   Memory loss 03/19/2021   Major depressive disorder, recurrent episode, severe (HCC) 12/15/2020   Generalized anxiety disorder 12/15/2020   Attention deficit hyperactivity disorder (ADHD), combined type, moderate 12/15/2020   Chronic migraine w/o aura w/o status migrainosus, not intractable 04/18/2020   Status post labral repair of shoulder 10/28/2019   Glenoid labral tear, right, subsequent encounter 09/29/2019   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 Authorizing Provider  amoxicillin-clavulanate (AUGMENTIN) 875-125 MG tablet Take 1 tablet by mouth every 12 (twelve) hours. 04/20/22  Yes Eustace Moore, MD  fluconazole (DIFLUCAN) 150 MG tablet Take 1 tablet (150 mg total) by mouth daily. Repeat in 1 week if needed 04/20/22  Yes Eustace Moore, MD  predniSONE (DELTASONE) 20 MG tablet Take 2 pills a day for 5 days then 1 pill a day for 5 days then discontinue 04/20/22  Yes Eustace Moore, MD  Erenumab-aooe (AIMOVIG) 70 MG/ML SOAJ Inject 70 mg into the skin every 30 (thirty) days. 03/08/22   Christen Butter, NP  HUMIRA PEN 40 MG/0.4ML PNKT SMARTSIG:40 Milligram(s) SUB-Q Every 2 Weeks 08/17/20   [provider]  levocetirizine (XYZAL) 5 MG tablet Take by mouth.    [provider]  levonorgestrel (MIRENA) 20 MCG/24HR IUD 1 each by Intrauterine route once. Mirena IUD placed 06/25/2019    [provider]  montelukast (SINGULAIR) 10 MG tablet Take by mouth.    [provider]  spironolactone (ALDACTONE) 50 MG tablet TAKE 3 TABLETS  BY MOUTH EVERY DAY 02/26/22   Christen Butter, NP    Family History Family History  Problem Relation Age of Onset   Migraines Mother    Hypercholesterolemia Mother    Hypertension Father    Cancer Maternal Grandfather        pancreatitis --> pancreatic CA    Social History Social History   Tobacco Use   Smoking status: Never   Smokeless tobacco: Never  Vaping Use   Vaping Use: Never used  Substance Use Topics   Alcohol use: Not Currently   Drug use: Never     Allergies   Contrast media [iodinated contrast media]   Review of Systems Review of Systems See HPI  Physical Exam Triage Vital Signs ED Triage Vitals  Enc Vitals Group     BP 04/20/22 0838 (!) 134/92     Pulse Rate 04/20/22 0838 66     Resp 04/20/22 0838 18     Temp 04/20/22  0838 98 F (36.7 C)     Temp Source 04/20/22 0838 Oral     SpO2 04/20/22 0838 98 %     Weight 04/20/22 0841 240 lb (108.9 kg)     Height --      Head Circumference --      Peak Flow --      Pain Score 04/20/22 0841 2     Pain Loc --      Pain Edu? --      Excl. in GC? --    No data found.  Updated Vital Signs BP (!) 134/92 (BP Location: Left Arm)   Pulse 66   Temp 98 F (36.7 C) (Oral)   Resp 18   Wt 108.9 kg   SpO2 98%   BMI 41.20 kg/m    Physical Exam Constitutional:      General: She is not in acute distress.    Appearance: She is well-developed. She is obese. She is ill-appearing.  HENT:     Head: Normocephalic and atraumatic.     Right Ear: Tympanic membrane normal.     Left Ear: Tympanic membrane and ear canal normal.     Nose: Congestion and rhinorrhea present.     Mouth/Throat:     Pharynx: No posterior oropharyngeal erythema.  Eyes:     Conjunctiva/sclera: Conjunctivae normal.     Pupils: Pupils are equal, round, and reactive to light.  Cardiovascular:     Rate and Rhythm: Normal rate and regular rhythm.     Heart sounds: Normal heart sounds.  Pulmonary:     Effort: Pulmonary effort is normal. No respiratory distress.     Breath sounds: Normal breath sounds.  Abdominal:     General: There is no distension.     Palpations: Abdomen is soft.  Musculoskeletal:        General: Normal range of motion.     Cervical back: Normal range of motion.  Lymphadenopathy:     Cervical: Cervical adenopathy present.  Skin:    General: Skin is warm and dry.  Neurological:     Mental Status: She is alert.      UC Treatments / Results  Labs (all labs ordered are listed, but only abnormal results are displayed) Labs Reviewed - No data to display  EKG   Radiology No results found.  Procedures Procedures (including critical care time)  Medications Ordered in UC Medications - No data to display  Initial Impression / Assessment and Plan / UC Course  I  have reviewed the triage vital signs and the nursing notes.  Pertinent labs & imaging results that were available during my care of the patient were reviewed by me and considered in my medical decision making (see chart for details).     Final Clinical Impressions(s) / UC Diagnoses   Final diagnoses:  Environmental and seasonal allergies  Acute bacterial sinusitis     Discharge Instructions      Make sure you are drinking lots of water Take the antibiotic 2 times a day.  Take with food Take prednisone once a day as directed May continue your usual allergy medicines See your doctor if you fail to improve by next week   ED Prescriptions     Medication Sig Dispense Auth. Provider   predniSONE (DELTASONE) 20 MG tablet Take 2 pills a day for 5 days then 1 pill a day for 5 days then discontinue 15 tablet Eustace Moore, MD   amoxicillin-clavulanate (AUGMENTIN) 875-125 MG tablet Take 1 tablet by mouth every 12 (twelve) hours. 14 tablet Eustace Moore, MD   fluconazole (DIFLUCAN) 150 MG tablet Take 1 tablet (150 mg total) by mouth daily. Repeat in 1 week if needed 2 tablet Eustace Moore, MD      PDMP not reviewed this encounter.   Eustace Moore, MD 04/20/22 1630

## 2022-04-20 NOTE — Discharge Instructions (Signed)
Make sure you are drinking lots of water Take the antibiotic 2 times a day.  Take with food Take prednisone once a day as directed May continue your usual allergy medicines See your doctor if you fail to improve by next week

## 2022-04-20 NOTE — ED Triage Notes (Signed)
Pt reports she has some nasal congestion, coughing sneezing, chest pain from coughing, headache for a month now.  Pt thought it was allergies but notes it is not getting better. Took benadryl sudafed. Xzyal and singular.

## 2022-04-30 DIAGNOSIS — L404 Guttate psoriasis: Secondary | ICD-10-CM | POA: Diagnosis not present

## 2022-04-30 DIAGNOSIS — L578 Other skin changes due to chronic exposure to nonionizing radiation: Secondary | ICD-10-CM | POA: Diagnosis not present

## 2022-04-30 DIAGNOSIS — L814 Other melanin hyperpigmentation: Secondary | ICD-10-CM | POA: Diagnosis not present

## 2022-04-30 DIAGNOSIS — D1801 Hemangioma of skin and subcutaneous tissue: Secondary | ICD-10-CM | POA: Diagnosis not present

## 2022-05-31 DIAGNOSIS — L4059 Other psoriatic arthropathy: Secondary | ICD-10-CM | POA: Diagnosis not present

## 2022-05-31 DIAGNOSIS — M545 Low back pain, unspecified: Secondary | ICD-10-CM | POA: Diagnosis not present

## 2022-05-31 DIAGNOSIS — L409 Psoriasis, unspecified: Secondary | ICD-10-CM | POA: Diagnosis not present

## 2022-06-07 DIAGNOSIS — L910 Hypertrophic scar: Secondary | ICD-10-CM | POA: Diagnosis not present

## 2022-06-16 NOTE — Progress Notes (Signed)
Established Patient Office Visit  Subjective   Patient ID: Ashlee Newton, female   DOB: 08/23/1995 Age: 27 y.o. MRN: [id number]   Chief Complaint  Patient presents with   Mental Health Problem   Weight Management Screening   HPI Pleasant 27 year old female presenting today for the following:  Mood: Notes that her anxiety and depressive symptoms have gotten really bad lately.  She has chronic feelings of fatigue and often has bad thoughts about herself.  She has friends however is finding herself not wanting to interact with them regularly.  In the past, she has taken BuSpar and Effexor but is not currently on any medications.  Does not remember if these medications worked well for her or not.  ADHD: Has tried multiple medications but has finally stopped all of them.  In the past, tried Wellbutrin, Adderall, Strattera, guanfacine, and Metadate.  Was prescribed Vyvanse at one point but reports that she never picked this up or tried it.  At this point, she feels like the treatment for ADHD is making her anxiety and depressive symptoms worse and she would like to focus on her other mental health concerns.  Has had some weight gain recently.  Is worried that she may be diabetic as she also has polydipsia, polyphagia, and polyuria.   Objective:    Vitals:   06/17/22 0955  BP: 124/80  Pulse: 82  Resp: 20  Height: 5\' 4"  (1.626 m)  Weight: 251 lb 0.6 oz (113.9 kg)  SpO2: 99%  BMI (Calculated): 43.07    Physical Exam Vitals and nursing note reviewed.  Constitutional:      General: She is not in acute distress.    Appearance: Normal appearance. She is not ill-appearing.  HENT:     Head: Normocephalic and atraumatic.  Cardiovascular:     Rate and Rhythm: Normal rate and regular rhythm.     Pulses: Normal pulses.     Heart sounds: Normal heart sounds.  Pulmonary:     Effort: Pulmonary effort is normal. No respiratory distress.     Breath sounds: Normal breath sounds. No wheezing,  rhonchi or rales.  Skin:    General: Skin is warm and dry.  Neurological:     Mental Status: She is alert and oriented to person, place, and time.  Psychiatric:        Mood and Affect: Mood normal.        Behavior: Behavior normal.        Thought Content: Thought content normal.        Judgment: Judgment normal.   No results found for this or any previous visit (from the past 24 hour(s)).     The ASCVD Risk score (Arnett DK, et al., 2019) failed to calculate for the following reasons:   The 2019 ASCVD risk score is only valid for ages 44 to 19   Assessment & Plan:   1. Attention deficit hyperactivity disorder (ADHD), combined type, moderate We will hold off on initiating any particular treatment at this time.  Stimulants seem to cause withdrawal headaches and the other medications were not working well.  Feel that getting her anxiety/depression symptoms under control will help [place] better evaluate the effect ADHD has on her daily life.  Patient agreeable to the plan.  2. Generalized anxiety disorder 3. Severe episode of recurrent major depressive disorder, without psychotic features ([place]) Discussed various options.  Would like to avoid using benzodiazepines.  Recommend maintenance medication with SSRI to start.  Patient  does verbalize difficulty remembering to take medications at the same time every day so we will start with fluoxetine 10 mg daily for 1 week then increase to fluoxetine 20 mg daily thereafter.  Advised to use technology to set alarms and reminders to help remember dosing.  4. Preventative health care Checking labs as below. - CBC with Differential/Platelet - COMPLETE METABOLIC PANEL WITH GFR - Lipid panel  5. Polydipsia 6. Polyphagia 7. Weight gain Checking TSH and hemoglobin A1c today. - TSH - Hemoglobin A1c  Return in about 6 weeks (around 07/29/2022) for mood follow up. ___________________________________________ Clearnce Sorrel, DNP, APRN, FNP-BC [facility] [place]

## 2022-06-17 ENCOUNTER — Encounter: Payer: Self-pay | Admitting: Medical-Surgical

## 2022-06-17 ENCOUNTER — Ambulatory Visit: Payer: BC Managed Care – PPO | Admitting: Medical-Surgical

## 2022-06-17 VITALS — BP 124/80 | HR 82 | Resp 20 | Ht 64.0 in | Wt 251.0 lb

## 2022-06-17 DIAGNOSIS — R631 Polydipsia: Secondary | ICD-10-CM

## 2022-06-17 DIAGNOSIS — Z0000 Encounter for general adult medical examination without abnormal findings: Secondary | ICD-10-CM | POA: Diagnosis not present

## 2022-06-17 DIAGNOSIS — L719 Rosacea, unspecified: Secondary | ICD-10-CM | POA: Insufficient documentation

## 2022-06-17 DIAGNOSIS — R635 Abnormal weight gain: Secondary | ICD-10-CM

## 2022-06-17 DIAGNOSIS — L409 Psoriasis, unspecified: Secondary | ICD-10-CM | POA: Insufficient documentation

## 2022-06-17 DIAGNOSIS — F411 Generalized anxiety disorder: Secondary | ICD-10-CM

## 2022-06-17 DIAGNOSIS — F332 Major depressive disorder, recurrent severe without psychotic features: Secondary | ICD-10-CM | POA: Diagnosis not present

## 2022-06-17 DIAGNOSIS — R632 Polyphagia: Secondary | ICD-10-CM

## 2022-06-17 DIAGNOSIS — F902 Attention-deficit hyperactivity disorder, combined type: Secondary | ICD-10-CM

## 2022-06-17 DIAGNOSIS — Z6841 Body Mass Index (BMI) 40.0 and over, adult: Secondary | ICD-10-CM | POA: Insufficient documentation

## 2022-06-17 MED ORDER — FLUOXETINE HCL 20 MG PO CAPS: 20.0000 mg | ORAL_CAPSULE | Freq: Every day | ORAL | 3 refills | Status: AC

## 2022-06-17 MED ORDER — FLUOXETINE HCL 10 MG PO CAPS: 10.0000 mg | ORAL_CAPSULE | Freq: Every day | ORAL | 0 refills | Status: DC

## 2022-06-17 MED ORDER — LEVOCETIRIZINE DIHYDROCHLORIDE 5 MG PO TABS: 5.0000 mg | ORAL_TABLET | Freq: Every evening | ORAL | 3 refills | Status: AC

## 2022-06-17 MED ORDER — MONTELUKAST SODIUM 10 MG PO TABS: 10.0000 mg | ORAL_TABLET | Freq: Every day | ORAL | 3 refills | Status: AC

## 2022-06-18 LAB — CBC WITH DIFFERENTIAL/PLATELET
Absolute Monocytes: 443 cells/uL (ref 200–950)
Basophils Absolute: 22 cells/uL (ref 0–200)
Basophils Relative: 0.4 %
Eosinophils Absolute: 92 cells/uL (ref 15–500)
Eosinophils Relative: 1.7 %
HCT: 41.1 % (ref 35.0–45.0)
Hemoglobin: 13.9 g/dL (ref 11.7–15.5)
Lymphs Abs: 2079 cells/uL (ref 850–3900)
MCH: 29.5 pg (ref 27.0–33.0)
MCHC: 33.8 g/dL (ref 32.0–36.0)
MCV: 87.3 fL (ref 80.0–100.0)
MPV: 9.8 fL (ref 7.5–12.5)
Monocytes Relative: 8.2 %
Neutro Abs: 2765 cells/uL (ref 1500–7800)
Neutrophils Relative %: 51.2 %
Platelets: 392 10*3/uL (ref 140–400)
RBC: 4.71 10*6/uL (ref 3.80–5.10)
RDW: 12.2 % (ref 11.0–15.0)
Total Lymphocyte: 38.5 %
WBC: 5.4 10*3/uL (ref 3.8–10.8)

## 2022-06-18 LAB — COMPLETE METABOLIC PANEL WITH GFR
AG Ratio: 1.4 (calc) (ref 1.0–2.5)
ALT: 15 U/L (ref 6–29)
AST: 15 U/L (ref 10–30)
Albumin: 4.3 g/dL (ref 3.6–5.1)
Alkaline phosphatase (APISO): 54 U/L (ref 31–125)
BUN: 15 mg/dL (ref 7–25)
CO2: 27 mmol/L (ref 20–32)
Calcium: 9.3 mg/dL (ref 8.6–10.2)
Chloride: 104 mmol/L (ref 98–110)
Creat: 0.78 mg/dL (ref 0.50–0.96)
Globulin: 3 g/dL (calc) (ref 1.9–3.7)
Glucose, Bld: 87 mg/dL (ref 65–99)
Potassium: 4.2 mmol/L (ref 3.5–5.3)
Sodium: 139 mmol/L (ref 135–146)
Total Bilirubin: 0.6 mg/dL (ref 0.2–1.2)
Total Protein: 7.3 g/dL (ref 6.1–8.1)
eGFR: 107 mL/min/{1.73_m2} (ref >=60)

## 2022-06-18 LAB — LIPID PANEL
Cholesterol: 171 mg/dL (ref <200)
HDL: 72 mg/dL (ref >=50)
LDL Cholesterol (Calc): 85 mg/dL
Non-HDL Cholesterol (Calc): 99 mg/dL (ref <130)
Total CHOL/HDL Ratio: 2.4 (calc) (ref <5.0)
Triglycerides: 59 mg/dL (ref <150)

## 2022-06-18 LAB — HEMOGLOBIN A1C
Hgb A1c MFr Bld: 5.4 %{Hb} (ref <5.7)
Mean Plasma Glucose: 108 mg/dL
eAG (mmol/L): 6 mmol/L

## 2022-06-18 LAB — TSH: TSH: 1.2 mIU/L

## 2022-07-11 DIAGNOSIS — M2550 Pain in unspecified joint: Secondary | ICD-10-CM | POA: Diagnosis not present

## 2022-07-15 ENCOUNTER — Other Ambulatory Visit: Payer: Self-pay | Admitting: Medical-Surgical

## 2022-07-24 ENCOUNTER — Other Ambulatory Visit: Payer: Self-pay | Admitting: Medical-Surgical

## 2022-08-05 ENCOUNTER — Ambulatory Visit: Payer: BC Managed Care – PPO | Admitting: Medical-Surgical

## 2022-08-05 ENCOUNTER — Encounter: Payer: Self-pay | Admitting: Medical-Surgical

## 2022-08-05 ENCOUNTER — Telehealth: Payer: Self-pay

## 2022-08-05 VITALS — BP 118/83 | HR 83 | Resp 20 | Ht 64.0 in | Wt 246.8 lb

## 2022-08-05 DIAGNOSIS — F902 Attention-deficit hyperactivity disorder, combined type: Secondary | ICD-10-CM | POA: Diagnosis not present

## 2022-08-05 DIAGNOSIS — F411 Generalized anxiety disorder: Secondary | ICD-10-CM | POA: Diagnosis not present

## 2022-08-05 DIAGNOSIS — F332 Major depressive disorder, recurrent severe without psychotic features: Secondary | ICD-10-CM

## 2022-08-05 MED ORDER — AMPHETAMINE-DEXTROAMPHETAMINE 10 MG PO TABS: 10.0000 mg | ORAL_TABLET | Freq: Every day | ORAL | 0 refills | Status: DC

## 2022-08-05 NOTE — Telephone Encounter (Signed)
Initiated Prior authorization [id number] '70MG'$ /ML auto-injectors Via: Covermymeds Case/Key:[id number] Status: approved  as of 08/05/22 Reason:dates not given Notified Pt via: Mychart

## 2022-08-05 NOTE — Progress Notes (Signed)
Established Patient Office Visit  Subjective   Patient ID: Ashlee Newton, female   DOB: May 06, 1996 Age: 27 y.o. MRN: [id number]   Chief Complaint  Patient presents with   Follow-up    MOOD   HPI Pleasant 27 year old female presenting today for follow-up on mood.  Currently taking fluoxetine 20 mg daily, tolerating well without side effects.  Notes that the medication is working extremely well for her and she is very happy with the results.  Admits that she feels like herself again rather than stressed out and worried like she was before.  Of note, she feels that she is having more difficulty with ADHD now and is interested and retrying medication now that she has the anxiety under control.  Would like to restart with Adderall since that seemed to help the most compared to all of the other medications that were attempted.  Denies SI/HI.     08/05/2022   11:15 AM 06/17/2022   11:37 AM 02/14/2022    2:22 PM 02/14/2022    1:42 PM 12/21/2021   10:28 AM  Depression screen PHQ 2/9  Decreased Interest 1 2   0  Down, Depressed, Hopeless 1 2   0  PHQ - 2 Score 2 4   0  Altered sleeping '1 3 3  2  '$ Tired, decreased energy '1 3 1  1  '$ Change in appetite '1 3 3 3 1  '$ Feeling bad or failure about yourself  '2 3 2  1  '$ Trouble concentrating '3 2 2 2 1  '$ Moving slowly or fidgety/restless '1 1 1  1  '$ Suicidal thoughts 0 '1 1  1  '$ PHQ-9 Score '11 20   8  '$ Difficult doing work/chores Somewhat difficult Somewhat difficult Somewhat difficult  Somewhat difficult      08/05/2022   11:16 AM 06/17/2022   11:37 AM 02/14/2022    2:23 PM 12/21/2021   10:33 AM  GAD 7 : Generalized Anxiety Score  Nervous, Anxious, on Edge '2 3 2 2  '$ Control/stop worrying '1 3 1 1  '$ Worry too much - different things '2 3 1 1  '$ Trouble relaxing '1 3 1 1  '$ Restless '3 3 2 2  '$ Easily annoyed or irritable '1 2 2 2  '$ Afraid - awful might happen 0 2 1 0  Total GAD 7 Score '10 19 10 9  '$ Anxiety Difficulty Somewhat difficult Somewhat difficult Somewhat  difficult Somewhat difficult    Objective:    Vitals:   08/05/22 1044  BP: 118/83  Pulse: 83  Resp: 20  Height: '5\' 4"'$  (1.626 m)  Weight: 246 lb 12.8 oz (111.9 kg)  SpO2: 98%  BMI (Calculated): 42.34    Physical Exam Vitals reviewed.  Constitutional:      General: She is not in acute distress.    Appearance: Normal appearance. She is obese. She is not ill-appearing.  HENT:     Head: Normocephalic and atraumatic.  Cardiovascular:     Rate and Rhythm: Normal rate and regular rhythm.     Pulses: Normal pulses.     Heart sounds: Normal heart sounds.  Pulmonary:     Effort: Pulmonary effort is normal. No respiratory distress.     Breath sounds: Normal breath sounds. No wheezing, rhonchi or rales.  Skin:    General: Skin is warm and dry.  Neurological:     Mental Status: She is alert and oriented to person, place, and time.  Psychiatric:        Mood  and Affect: Mood normal.        Behavior: Behavior normal.        Thought Content: Thought content normal.        Judgment: Judgment normal.   No results found for this or any previous visit (from the past 24 hour(s)).     The ASCVD Risk score (Arnett DK, et al., 2019) failed to calculate for the following reasons:   The 2019 ASCVD risk score is only valid for ages 79 to 73   Assessment & Plan:   1. Attention deficit hyperactivity disorder (ADHD), combined type, moderate Restarting Adderall XR 10 mg daily.  2. Generalized anxiety disorder 3. Severe episode of recurrent major depressive disorder, without psychotic features ([place]) Stable on current regimen.  Continue fluoxetine 20 mg daily.  Return in about 4 weeks (around 09/02/2022) for ADHD follow up (virtual is fine).  ___________________________________________ Clearnce Sorrel, DNP, APRN, FNP-BC [facility] [place]

## 2022-08-06 ENCOUNTER — Encounter: Payer: Self-pay | Admitting: Medical-Surgical

## 2022-08-06 MED ORDER — AIMOVIG 70 MG/ML ~~LOC~~ SOAJ: SUBCUTANEOUS | 3 refills | Status: DC

## 2022-08-06 NOTE — Telephone Encounter (Signed)
Forwarded to provider for review. Pended prescription.

## 2022-09-03 ENCOUNTER — Encounter: Payer: Self-pay | Admitting: Medical-Surgical

## 2022-09-03 ENCOUNTER — Telehealth (INDEPENDENT_AMBULATORY_CARE_PROVIDER_SITE_OTHER): Payer: BC Managed Care – PPO | Admitting: Medical-Surgical

## 2022-09-03 DIAGNOSIS — F902 Attention-deficit hyperactivity disorder, combined type: Secondary | ICD-10-CM

## 2022-09-03 MED ORDER — AMPHETAMINE-DEXTROAMPHETAMINE 10 MG PO TABS
ORAL_TABLET | ORAL | 0 refills | Status: AC
Start: 2022-09-03 — End: ?

## 2022-09-03 MED ORDER — AIMOVIG 70 MG/ML ~~LOC~~ SOAJ: SUBCUTANEOUS | 3 refills | Status: AC

## 2022-09-03 NOTE — Assessment & Plan Note (Signed)
Doing well on his currently his Adderall with none of the side effects that occurred with the extended release dose.  Would like to continue the medication however feels that she needs something in the afternoon.  Continue Adderall 10 mg instant release early in the morning.  Adding a 5 mg instant release dose in the afternoon however recommend taking this around 12 noon rather than waiting until 2-3 PM.  If this does not help with new onset sleep issues, we may need to evaluate for other options/medications.  Requested she send me a message in a few weeks to see how she is doing with this regimen.  Plan to follow-up with an office visit in 3 months.

## 2022-09-03 NOTE — Progress Notes (Signed)
Virtual Visit via Video Note  I connected with Ashlee Newton on 09/03/22 at 11:10 AM EDT by a video enabled telemedicine application and verified that I am speaking with the correct person using two identifiers.   I discussed the limitations of evaluation and management by telemedicine and the availability of in person appointments. The patient expressed understanding and agreed to proceed.  Patient location: home Provider locations: office  Subjective:    CC: ADHD follow-up  HPI: Pleasant 27 year old female presenting today via MyChart video visit for ADHD/mood follow-up.  She has been doing well overall.  Taking fluoxetine 20 mg daily, tolerating well without side effects.  Feels the medication is working well to help control anxiety and depression symptoms.  Denies SI/HI.  Started Adderall 10 mg instant release once daily approximately 4 weeks ago.  She has been taking the medication early in the morning however by 2-3 PM, she is having withdrawal symptoms including headache and significant fatigue.  She has played around with the prescription and tried 10 mg early in the morning with a 5 mg afternoon dose which seems to help her get through the day.  Unfortunately, she has developed some difficulty with getting to sleep at night and is not sure what to do about this.  No changes in appetite or weight fluctuations.   Past medical history, Surgical history, Family history not pertinant except as noted below, Social history, Allergies, and medications have been entered into the medical record, reviewed, and corrections made.   Review of Systems: See HPI for pertinent positives and negatives.   Objective:    General: Speaking clearly in complete sentences without any shortness of breath.  Alert and oriented x3.  Normal judgment. No apparent acute distress.  Impression and Recommendations:    Attention deficit hyperactivity disorder (ADHD), combined type, moderate Doing well on his  currently his Adderall with none of the side effects that occurred with the extended release dose.  Would like to continue the medication however feels that she needs something in the afternoon.  Continue Adderall 10 mg instant release early in the morning.  Adding a 5 mg instant release dose in the afternoon however recommend taking this around 12 noon rather than waiting until 2-3 PM.  If this does not help with new onset sleep issues, we may need to evaluate for other options/medications.  Requested she send me a message in a few weeks to see how she is doing with this regimen.  Plan to follow-up with an office visit in 3 months.  I discussed the assessment and treatment plan with the patient. The patient was provided an opportunity to ask questions and all were answered. The patient agreed with the plan and demonstrated an understanding of the instructions.   The patient was advised to call back or seek an in-person evaluation if the symptoms worsen or if the condition fails to improve as anticipated.  25 minutes of non-face-to-face time was provided during this encounter.  Return in about 3 months (around 12/03/2022) for ADHD follow up.  Thayer Ohm, DNP, APRN, FNP-BC [facility] and [facility] Medicine

## 2022-09-09 ENCOUNTER — Encounter: Payer: Self-pay | Admitting: *Deleted

## 2022-09-24 ENCOUNTER — Encounter: Payer: Self-pay | Admitting: Medical-Surgical

## 2022-10-24 ENCOUNTER — Encounter: Payer: Self-pay | Admitting: Medical-Surgical

## 2022-10-25 MED ORDER — PHENTERMINE-TOPIRAMATE ER 3.75-23 MG PO CP24: 1.0000 | ORAL_CAPSULE | Freq: Every morning | ORAL | 0 refills | Status: AC
